# Patient Record
Sex: Female | Born: 1935 | Race: White | Hispanic: No | Marital: Married | Smoking: Never smoker
Health system: Southern US, Community
[De-identification: ages and names within clinical notes are randomized; demographics above are authoritative.]

## PROBLEM LIST (undated history)

## (undated) DIAGNOSIS — IMO0001 Reserved for inherently not codable concepts without codable children: Secondary | ICD-10-CM

## (undated) DIAGNOSIS — K219 Gastro-esophageal reflux disease without esophagitis: Secondary | ICD-10-CM

## (undated) DIAGNOSIS — E079 Disorder of thyroid, unspecified: Secondary | ICD-10-CM

## (undated) DIAGNOSIS — F329 Major depressive disorder, single episode, unspecified: Secondary | ICD-10-CM

## (undated) DIAGNOSIS — F32A Depression, unspecified: Secondary | ICD-10-CM

## (undated) DIAGNOSIS — I499 Cardiac arrhythmia, unspecified: Secondary | ICD-10-CM

## (undated) DIAGNOSIS — R0600 Dyspnea, unspecified: Secondary | ICD-10-CM

## (undated) DIAGNOSIS — I10 Essential (primary) hypertension: Secondary | ICD-10-CM

## (undated) DIAGNOSIS — C4490 Unspecified malignant neoplasm of skin, unspecified: Secondary | ICD-10-CM

## (undated) HISTORY — DX: Reserved for inherently not codable concepts without codable children: IMO0001

## (undated) HISTORY — DX: Unspecified malignant neoplasm of skin, unspecified: C44.90

## (undated) HISTORY — PX: NECK SURGERY: SHX720

## (undated) HISTORY — DX: Essential (primary) hypertension: I10

## (undated) HISTORY — DX: Gastro-esophageal reflux disease without esophagitis: K21.9

## (undated) HISTORY — PX: BREAST BIOPSY: SHX20

---
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
## 2005-01-14 ENCOUNTER — Ambulatory Visit: Payer: Self-pay | Admitting: Internal Medicine

## 2005-08-29 ENCOUNTER — Ambulatory Visit: Payer: Self-pay | Admitting: Internal Medicine

## 2005-09-03 ENCOUNTER — Ambulatory Visit: Payer: Self-pay | Admitting: Unknown Physician Specialty

## 2005-09-03 ENCOUNTER — Other Ambulatory Visit: Payer: Self-pay

## 2005-09-08 ENCOUNTER — Inpatient Hospital Stay: Payer: Self-pay | Admitting: Unknown Physician Specialty

## 2005-12-23 ENCOUNTER — Ambulatory Visit: Payer: Self-pay | Admitting: Nurse Practitioner

## 2006-02-03 ENCOUNTER — Ambulatory Visit: Payer: Self-pay | Admitting: Internal Medicine

## 2006-08-12 ENCOUNTER — Ambulatory Visit: Payer: Self-pay | Admitting: Unknown Physician Specialty

## 2007-03-16 ENCOUNTER — Ambulatory Visit: Payer: Self-pay | Admitting: Internal Medicine

## 2007-10-28 ENCOUNTER — Ambulatory Visit: Payer: Self-pay | Admitting: Unknown Physician Specialty

## 2008-03-16 ENCOUNTER — Ambulatory Visit: Payer: Self-pay | Admitting: Internal Medicine

## 2009-03-20 ENCOUNTER — Ambulatory Visit: Payer: Self-pay | Admitting: Ophthalmology

## 2009-03-21 ENCOUNTER — Ambulatory Visit: Payer: Self-pay | Admitting: Internal Medicine

## 2009-04-02 ENCOUNTER — Ambulatory Visit: Payer: Self-pay | Admitting: Ophthalmology

## 2009-06-26 ENCOUNTER — Ambulatory Visit: Payer: Self-pay | Admitting: Ophthalmology

## 2009-07-16 ENCOUNTER — Ambulatory Visit: Payer: Self-pay | Admitting: Ophthalmology

## 2010-03-27 ENCOUNTER — Ambulatory Visit: Payer: Self-pay | Admitting: Internal Medicine

## 2010-11-21 ENCOUNTER — Emergency Department: Payer: Self-pay | Admitting: Emergency Medicine

## 2011-04-14 ENCOUNTER — Ambulatory Visit: Payer: Self-pay | Admitting: Family Medicine

## 2011-12-24 ENCOUNTER — Ambulatory Visit: Payer: Self-pay | Admitting: Internal Medicine

## 2012-04-14 ENCOUNTER — Ambulatory Visit: Payer: Self-pay | Admitting: Internal Medicine

## 2013-05-03 ENCOUNTER — Ambulatory Visit: Payer: Self-pay | Admitting: Family Medicine

## 2013-06-07 ENCOUNTER — Encounter: Payer: Self-pay | Admitting: Podiatry

## 2013-06-08 ENCOUNTER — Encounter: Payer: Self-pay | Admitting: Podiatry

## 2013-06-08 ENCOUNTER — Ambulatory Visit (INDEPENDENT_AMBULATORY_CARE_PROVIDER_SITE_OTHER): Payer: Medicare Other | Admitting: Podiatry

## 2013-06-08 ENCOUNTER — Ambulatory Visit (INDEPENDENT_AMBULATORY_CARE_PROVIDER_SITE_OTHER): Payer: Medicare Other

## 2013-06-08 VITALS — BP 154/80 | HR 61 | Resp 16 | Ht 67.0 in | Wt 175.0 lb

## 2013-06-08 DIAGNOSIS — M19079 Primary osteoarthritis, unspecified ankle and foot: Secondary | ICD-10-CM

## 2013-06-08 DIAGNOSIS — M79671 Pain in right foot: Secondary | ICD-10-CM

## 2013-06-08 DIAGNOSIS — M79609 Pain in unspecified limb: Secondary | ICD-10-CM

## 2013-06-08 NOTE — Progress Notes (Signed)
N PAIN L B/L FOOT - RIGHT -LATERAL, LEFT-? D 85M? O SLOWLY C WORSE A WALKING T OTC INSERTS

## 2013-06-08 NOTE — Progress Notes (Signed)
[place] presents today as an 79 year old white female healthy and active with a chief complaint of pain and generalized uncomfortable sensation to the foot bilaterally she has been to see a chiropractor who told her that her back pain is associated with her high arches. She denies any trauma to the feet.  Objective: Vital signs are stable she is alert and oriented x3 I have reviewed her past medical history medications and allergies Vascular evaluation demonstrates +2/4 DPPT and capillary fill time to digits one through 5 is immediate. Deep tendon reflexes are brisk and equal bilateral. Muscle strength + over 5 dorsiflexors plantar flexors inverters and evertors. All and transit musculature is intact orthopedic evaluation demonstrates mild cavus deformities bilateral with mild flexible hammertoe deformities noted bilateral as well she has some tenderness on palpation of the sinus tarsi of the right foot but minimally reactive to the pain. Radiographic evaluation does demonstrates early osteoarthritic changes to the midfoot bilateral. She has no pain on palpation of the medial calcaneal tubercle or the plantar fascia.  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  Plan: I offered her injections were she declined I offered her information regarding shoe gear and suggested that she should followup with the shoe market.

## 2013-10-26 DIAGNOSIS — K219 Gastro-esophageal reflux disease without esophagitis: Secondary | ICD-10-CM | POA: Insufficient documentation

## 2013-10-26 DIAGNOSIS — E785 Hyperlipidemia, unspecified: Secondary | ICD-10-CM | POA: Insufficient documentation

## 2013-10-26 DIAGNOSIS — I679 Cerebrovascular disease, unspecified: Secondary | ICD-10-CM | POA: Insufficient documentation

## 2013-10-26 DIAGNOSIS — K5732 Diverticulitis of large intestine without perforation or abscess without bleeding: Secondary | ICD-10-CM | POA: Insufficient documentation

## 2013-11-22 DIAGNOSIS — M549 Dorsalgia, unspecified: Secondary | ICD-10-CM | POA: Insufficient documentation

## 2014-05-05 ENCOUNTER — Ambulatory Visit: Payer: Self-pay | Admitting: Family Medicine

## 2014-05-19 ENCOUNTER — Ambulatory Visit: Payer: Self-pay | Admitting: Family Medicine

## 2014-09-21 ENCOUNTER — Ambulatory Visit (INDEPENDENT_AMBULATORY_CARE_PROVIDER_SITE_OTHER): Payer: Medicare Other | Admitting: Podiatry

## 2014-09-21 ENCOUNTER — Encounter: Payer: Self-pay | Admitting: Podiatry

## 2014-09-21 ENCOUNTER — Ambulatory Visit (INDEPENDENT_AMBULATORY_CARE_PROVIDER_SITE_OTHER): Payer: Medicare Other

## 2014-09-21 VITALS — BP 166/91 | HR 70 | Resp 16 | Ht 67.0 in | Wt 175.0 lb

## 2014-09-21 DIAGNOSIS — M779 Enthesopathy, unspecified: Secondary | ICD-10-CM

## 2014-09-21 DIAGNOSIS — M79671 Pain in right foot: Secondary | ICD-10-CM

## 2014-09-22 NOTE — Progress Notes (Signed)
Patient ID: Jenny [name], female   DOB: 06/02/1928, 81 y.o.   MRN: [id number]  Subjective: Jenny [name] presents the office today with complaints of pain on the outside aspect of her right foot which has been ongoing for "several months". She states that she has painted the outside part of her foot particularly with walking and standing. She does not have pain at rest. She denies any recent injury or trauma to the area. She denies any change increase activity the time of onset of symptoms. She denies any swelling or redness overlying the area. Since her last appointment she has gone to the shoe market and purchase new shoes and inserts given her high arch foot type. No other complaints at this time.  Objective: AAO 3, NAD DP/PT pulses palpable, CRT less than 3 seconds Protective sensation intact with Simms Weinstein monofilament, vibratory sensation intact, Achilles tendon reflex intact. She has a mildly high increase in medial arch height bilaterally. Upon weightbearing she does tend to put more pressure on the outside aspect of her foot. There is tenderness overlying the fifth metatarsal along the central aspect. There is no specific pinpoint bony tenderness and there is no pain with vibratory sensation. There is no pain along the course of the peroneal tendons. There is no other areas of tenderness to bilateral lower extremities. There is no overlying edema, erythema, increase in warmth bilaterally. MMT 5/5, ROM WNL No open lesions or pre-ulcer lesions identified bilaterally. No pain with calf compression, swelling, warmth, erythema.  Assessment: 81 year old female with slight cavus deformity with lateral foot pain  Plan: -X-rays were obtained and reviewed with the patient.  -Treatment options were discussed the patient including alternatives, risks, complications. -I modify the patient's orthotics to help alleviate some of the pressure off the lateral aspect of the foot. I also discussed with  her that if the over-the-counter orthotics are not helping she may benefit from custom orthotics in the future. -Follow-up symptoms and a result of the 4 weeks or sooner if any problems are to arise. In the meantime encouraged to call the office with any questions, concerns, change in symptoms.

## 2015-04-06 ENCOUNTER — Other Ambulatory Visit: Payer: Self-pay | Admitting: Family Medicine

## 2015-04-06 DIAGNOSIS — R1012 Left upper quadrant pain: Secondary | ICD-10-CM

## 2015-04-06 DIAGNOSIS — R109 Unspecified abdominal pain: Secondary | ICD-10-CM

## 2015-04-09 ENCOUNTER — Ambulatory Visit
Admission: RE | Admit: 2015-04-09 | Discharge: 2015-04-09 | Disposition: A | Discharge status: Home / Self Care | Payer: Medicare Other | Source: Ambulatory Visit | Attending: Family Medicine | Admitting: Family Medicine

## 2015-04-09 DIAGNOSIS — R109 Unspecified abdominal pain: Secondary | ICD-10-CM | POA: Diagnosis not present

## 2015-04-09 DIAGNOSIS — R1012 Left upper quadrant pain: Secondary | ICD-10-CM | POA: Diagnosis present

## 2015-04-09 DIAGNOSIS — K5790 Diverticulosis of intestine, part unspecified, without perforation or abscess without bleeding: Secondary | ICD-10-CM | POA: Diagnosis not present

## 2015-04-09 MED ORDER — IOHEXOL 300 MG/ML  SOLN
100.0000 mL | Freq: Once | INTRAMUSCULAR | Status: AC | PRN
  Administered 2015-04-09: 100 mL via INTRAVENOUS

## 2015-04-26 ENCOUNTER — Encounter: Payer: Self-pay | Admitting: *Deleted

## 2015-04-26 ENCOUNTER — Ambulatory Visit: Payer: Medicare Other | Admitting: Anesthesiology

## 2015-04-26 ENCOUNTER — Ambulatory Visit
Admission: RE | Admit: 2015-04-26 | Discharge: 2015-04-26 | Disposition: A | Discharge status: Home / Self Care | Payer: Medicare Other | Source: Ambulatory Visit | Attending: Unknown Physician Specialty | Admitting: Unknown Physician Specialty

## 2015-04-26 ENCOUNTER — Encounter
Admission: RE | Disposition: A | Discharge status: Home / Self Care | Payer: Self-pay | Source: Ambulatory Visit | Attending: Unknown Physician Specialty

## 2015-04-26 DIAGNOSIS — Z881 Allergy status to other antibiotic agents status: Secondary | ICD-10-CM | POA: Insufficient documentation

## 2015-04-26 DIAGNOSIS — K449 Diaphragmatic hernia without obstruction or gangrene: Secondary | ICD-10-CM | POA: Diagnosis not present

## 2015-04-26 DIAGNOSIS — Z79899 Other long term (current) drug therapy: Secondary | ICD-10-CM | POA: Diagnosis not present

## 2015-04-26 DIAGNOSIS — K219 Gastro-esophageal reflux disease without esophagitis: Secondary | ICD-10-CM | POA: Diagnosis not present

## 2015-04-26 DIAGNOSIS — I10 Essential (primary) hypertension: Secondary | ICD-10-CM | POA: Diagnosis not present

## 2015-04-26 DIAGNOSIS — Z85828 Personal history of other malignant neoplasm of skin: Secondary | ICD-10-CM | POA: Insufficient documentation

## 2015-04-26 DIAGNOSIS — R1012 Left upper quadrant pain: Secondary | ICD-10-CM | POA: Insufficient documentation

## 2015-04-26 DIAGNOSIS — K317 Polyp of stomach and duodenum: Secondary | ICD-10-CM | POA: Diagnosis not present

## 2015-04-26 DIAGNOSIS — Z7902 Long term (current) use of antithrombotics/antiplatelets: Secondary | ICD-10-CM | POA: Insufficient documentation

## 2015-04-26 DIAGNOSIS — R935 Abnormal findings on diagnostic imaging of other abdominal regions, including retroperitoneum: Secondary | ICD-10-CM | POA: Diagnosis present

## 2015-04-26 HISTORY — PX: ESOPHAGOGASTRODUODENOSCOPY (EGD) WITH PROPOFOL: SHX5813

## 2015-04-26 SURGERY — ESOPHAGOGASTRODUODENOSCOPY (EGD) WITH PROPOFOL
Anesthesia: General

## 2015-04-26 MED ORDER — PROPOFOL 10 MG/ML IV BOLUS
INTRAVENOUS | Status: DC | PRN
  Administered 2015-04-26: 50 mg via INTRAVENOUS

## 2015-04-26 MED ORDER — SODIUM CHLORIDE 0.9 % IV SOLN: INTRAVENOUS | Status: DC

## 2015-04-26 MED ORDER — FENTANYL CITRATE (PF) 100 MCG/2ML IJ SOLN
INTRAMUSCULAR | Status: DC | PRN
  Administered 2015-04-26: 50 ug via INTRAVENOUS

## 2015-04-26 MED ORDER — SODIUM CHLORIDE 0.9 % IV SOLN
INTRAVENOUS | Status: DC
  Administered 2015-04-26: 09:00:00 via INTRAVENOUS

## 2015-04-26 MED ORDER — LIDOCAINE HCL (PF) 2 % IJ SOLN
INTRAMUSCULAR | Status: DC | PRN
  Administered 2015-04-26: 50 mg

## 2015-04-26 MED ORDER — PROPOFOL 500 MG/50ML IV EMUL
INTRAVENOUS | Status: DC | PRN
  Administered 2015-04-26: 100 ug/kg/min via INTRAVENOUS

## 2015-04-26 NOTE — Anesthesia Postprocedure Evaluation (Signed)
  Anesthesia Post-op Note  Patient: Jenny [name]  Procedure(s) Performed: Procedure(s): ESOPHAGOGASTRODUODENOSCOPY (EGD) WITH PROPOFOL (N/A)  Anesthesia type:General  Patient location: PACU  Post pain: Pain level controlled  Post assessment: Post-op Vital signs reviewed, Patient's Cardiovascular Status Stable, Respiratory Function Stable, Patent Airway and No signs of Nausea or vomiting  Post vital signs: Reviewed and stable  Last Vitals:  Filed Vitals:   04/26/15 1020  BP: 160/117  Pulse: 55  Temp:   Resp: 16    Level of consciousness: awake, alert  and patient cooperative  Complications: No apparent anesthesia complications

## 2015-04-26 NOTE — H&P (Signed)
   Primary Care Physician:  Marcello Fennel, MD Primary Gastroenterologist:  Dr. Vira Agar  Pre-Procedure History & Physical: HPI:  Jenny [name] is a 81 y.o. female is here for an endoscopy.   Past Medical History  Diagnosis Date  . Reflux   . Skin cancer   . HBP (high blood pressure)     Past Surgical History  Procedure Laterality Date  . Neck surgery    . Breast biopsy Right     Prior to Admission medications   Medication Sig Start Date End Date Taking? Authorizing Provider  Cholecalciferol (VITAMIN D-3 PO) Take by mouth daily.   Yes Historical Provider, MD  ciprofloxacin (CIPRO) 250 MG tablet  04/28/13  Yes Historical Provider, MD  MELATONIN PO Take by mouth daily.   Yes Historical Provider, MD  Multiple Vitamins-Minerals (MULTIVITAMIN PO) Take by mouth daily.   Yes Historical Provider, MD  pantoprazole (PROTONIX) 20 MG tablet  05/07/13  Yes Historical Provider, MD  simvastatin (ZOCOR) 20 MG tablet  05/07/13  Yes Historical Provider, MD  clopidogrel (PLAVIX) 75 MG tablet  05/07/13   Historical Provider, MD    Allergies as of 04/17/2015 - Review Complete 04/09/2015  Allergen Reaction Noted  . Flagyl [metronidazole] Nausea And Vomiting 06/08/2013    History reviewed. No pertinent family history.  Social History   Social History  . Marital Status: Married    Spouse Name: N/A  . Number of Children: N/A  . Years of Education: N/A   Occupational History  . Not on file.   Social History Main Topics  . Smoking status: Never Smoker   . Smokeless tobacco: Never Used  . Alcohol Use: No  . Drug Use: No  . Sexual Activity: Not on file   Other Topics Concern  . Not on file   Social History Narrative    Review of Systems: See HPI, otherwise negative ROS  Physical Exam: BP 173/75 mmHg  Pulse 69  Temp(Src) 98.6 F (37 C)  Resp 14  Ht 5\' 7"  (1.702 m)  Wt 79.379 kg (175 lb)  BMI 27.40 kg/m2  SpO2 96% General:   Alert,  pleasant and cooperative in NAD Head:   Normocephalic and atraumatic. Neck:  Supple; no masses or thyromegaly. Lungs:  Clear throughout to auscultation.    Heart:  Regular rate and rhythm. Abdomen:  Soft, nontender and nondistended. Normal bowel sounds, without guarding, and without rebound.   Neurologic:  Alert and  oriented x4;  grossly normal neurologically.  Impression/Plan: Jenny [name] is here for an endoscopy to be performed for abnormal CT scan of stomach and Left upper abd pain.  Risks, benefits, limitations, and alternatives regarding  endoscopy have been reviewed with the patient.  Questions have been answered.  All parties agreeable.   Gaylyn Cheers, MD  04/26/2015, 9:22 AM

## 2015-04-26 NOTE — Anesthesia Preprocedure Evaluation (Signed)
Anesthesia Evaluation  Patient identified by MRN, date of birth, ID band Patient awake    Reviewed: Allergy & Precautions, H&P , NPO status , Patient's Chart, lab work & pertinent test results  History of Anesthesia Complications Negative for: history of anesthetic complications  Airway Mallampati: II  TM Distance: >3 FB Neck ROM: limited    Dental  (+) Poor Dentition, Chipped   Pulmonary neg pulmonary ROS, neg shortness of breath,    Pulmonary exam normal breath sounds clear to auscultation       Cardiovascular Exercise Tolerance: Good hypertension, (-) angina(-) DOE Normal cardiovascular exam Rhythm:regular Rate:Normal     Neuro/Psych negative neurological ROS  negative psych ROS   GI/Hepatic Neg liver ROS, GERD  Controlled,  Endo/Other  negative endocrine ROS  Renal/GU negative Renal ROS  negative genitourinary   Musculoskeletal   Abdominal   Peds  Hematology negative hematology ROS (+)   Anesthesia Other Findings Past Medical History:   Reflux                                                       Skin cancer                                                  HBP (high blood pressure)                                   Past Surgical History:   NECK SURGERY                                                  BREAST BIOPSY                                   Right             BMI    Body Mass Index   27.40 kg/m 2      Reproductive/Obstetrics negative OB ROS                             Anesthesia Physical Anesthesia Plan  ASA: III  Anesthesia Plan: General   Post-op Pain Management:    Induction:   Airway Management Planned:   Additional Equipment:   Intra-op Plan:   Post-operative Plan:   Informed Consent: I have reviewed the patients History and Physical, chart, labs and discussed the procedure including the risks, benefits and alternatives for the proposed anesthesia  with the patient or authorized representative who has indicated his/her understanding and acceptance.   Dental Advisory Given  Plan Discussed with: Anesthesiologist, CRNA and Surgeon  Anesthesia Plan Comments:         Anesthesia Quick Evaluation

## 2015-04-26 NOTE — Transfer of Care (Signed)
Immediate Anesthesia Transfer of Care Note  Patient: Jenny [name]  Procedure(s) Performed: Procedure(s): ESOPHAGOGASTRODUODENOSCOPY (EGD) WITH PROPOFOL (N/A)  Patient Location: PACU  Anesthesia Type:General  Level of Consciousness: sedated  Airway & Oxygen Therapy: Patient Spontanous Breathing and Patient connected to nasal cannula oxygen  Post-op Assessment: Report given to RN and Post -op Vital signs reviewed and stable  Post vital signs: Reviewed and stable  Last Vitals:  Filed Vitals:   04/26/15 0849  BP: 173/75  Pulse: 69  Temp: 37 C  Resp: 14    Complications: No apparent anesthesia complications

## 2015-04-26 NOTE — Op Note (Signed)
[facility] Patient Name: Jenny [name] Procedure Date: 04/26/2015 9:23 AM MRN: [id number] Account #: [id number] Date of Birth: Feb 12, 1928 Admit Type: Outpatient Age: 81 Room: [facility] ENDO ROOM 1 Gender: Female Note Status: Finalized Procedure:         Upper GI endoscopy Indications:       Abdominal pain in the left upper quadrant, Abnormal CT of                     the GI tract Providers:         Manya Silvas, MD Referring MD:      Caprice Renshaw (Referring MD) Medicines:         Propofol per Anesthesia Complications:     No immediate complications. Procedure:         Pre-Anesthesia Assessment:                    - After reviewing the risks and benefits, the patient was                     deemed in satisfactory condition to undergo the procedure.                    After obtaining informed consent, the endoscope was passed                     under direct vision. Throughout the procedure, the                     patient's blood pressure, pulse, and oxygen saturations                     were monitored continuously. The Olympus GIF-160 endoscope                     (S#. [id number]) was introduced through the mouth, and                     advanced to the second part of duodenum. The upper GI                     endoscopy was accomplished without difficulty. The patient                     tolerated the procedure well. Findings:      The examined esophagus was normal. GEJ 39cm.      A small hiatus hernia was present.      Multiple small sessile polyps with no bleeding and no stigmata of recent       bleeding were found in the gastric body. Biopsies were taken with a cold       forceps for histology.      Normal mucosa was found in the entire examined stomach. Biopsies were       taken with a cold forceps for histology. Biopsies were taken with a cold       forceps for Helicobacter pylori testing.      The examined duodenum was  normal. Impression:        - Normal esophagus.                    - Small hiatus hernia.                    -  Multiple gastric polyps. Biopsied.                    - Normal mucosa was found in the entire stomach. Biopsied.                    - Normal examined duodenum. Recommendation:    - Await pathology results. Manya Silvas, MD 04/26/2015 9:38:01 AM This report has been signed electronically. Number of Addenda: 0 Note Initiated On: 04/26/2015 9:23 AM      [facility]

## 2015-04-27 ENCOUNTER — Encounter: Payer: Self-pay | Admitting: Unknown Physician Specialty

## 2015-04-27 LAB — SURGICAL PATHOLOGY

## 2015-06-07 DIAGNOSIS — E559 Vitamin D deficiency, unspecified: Secondary | ICD-10-CM | POA: Insufficient documentation

## 2015-07-31 ENCOUNTER — Other Ambulatory Visit: Payer: Self-pay | Admitting: Unknown Physician Specialty

## 2015-07-31 ENCOUNTER — Ambulatory Visit
Admission: RE | Admit: 2015-07-31 | Discharge: 2015-07-31 | Disposition: A | Discharge status: Home / Self Care | Payer: Medicare Other | Source: Ambulatory Visit | Attending: Unknown Physician Specialty | Admitting: Unknown Physician Specialty

## 2015-07-31 DIAGNOSIS — R05 Cough: Secondary | ICD-10-CM | POA: Diagnosis present

## 2015-07-31 DIAGNOSIS — R059 Cough, unspecified: Secondary | ICD-10-CM

## 2015-10-03 ENCOUNTER — Ambulatory Visit (INDEPENDENT_AMBULATORY_CARE_PROVIDER_SITE_OTHER): Payer: Medicare Other | Admitting: Podiatry

## 2015-10-03 ENCOUNTER — Encounter: Payer: Self-pay | Admitting: Podiatry

## 2015-10-03 ENCOUNTER — Ambulatory Visit (INDEPENDENT_AMBULATORY_CARE_PROVIDER_SITE_OTHER): Payer: Medicare Other

## 2015-10-03 VITALS — BP 159/82 | HR 61 | Resp 18

## 2015-10-03 DIAGNOSIS — R52 Pain, unspecified: Secondary | ICD-10-CM

## 2015-10-03 MED ORDER — MELOXICAM 15 MG PO TABS: 15.0000 mg | ORAL_TABLET | Freq: Every day | ORAL | Status: DC

## 2015-10-03 NOTE — Progress Notes (Signed)
She presents today with chief complaint of bilateral heel pain for the past several weeks. She states it is starting to make the top of her feet hurt.  Objective: Vital signs are stable she is alert and oriented 3. I have reviewed her past medical history medications allergies surgeries and social history. Pulses are strongly palpable bilateral. She has pain on palpation medial continued tubercles bilateral. Radiographs confirm small plantar distally oriented calcaneal heel spur with a soft tissue increase in density at the plantar fascial calcaneal insertion site and some calcification in the area.  Assessment: Proximal plantar fasciitis bilateral. Resulting in some lateral compensatory syndrome.  Plan: Offered her an injection today which she declined we did apply plantar fascial braces and started her on meloxicam. Follow-up with her in a couple of months.

## 2015-10-04 ENCOUNTER — Telehealth: Payer: Self-pay | Admitting: *Deleted

## 2015-10-04 NOTE — Telephone Encounter (Signed)
Pt states she has questions about her treatment yesterday.  I spoke to pt's husband and he said pt has gone to the [employer].  I told him to tell her I called.

## 2015-10-04 NOTE — Telephone Encounter (Signed)
Pt called states the foot bracelets hurt her feet and she went to [employer] in [place] and they fitted her for shoes and inserts and her feet feel much better.  Pt states she would like to return the straps given yesterday, they make her feet hurt worse and made a red mark on the top of her foot, and she wants to just use the medication prescribed and the shoes.  I told pt that we could not take the straps back, but I could get someone to show her how to put them on comfortably, pt refused.

## 2015-10-10 ENCOUNTER — Ambulatory Visit (INDEPENDENT_AMBULATORY_CARE_PROVIDER_SITE_OTHER): Payer: Medicare Other | Admitting: Podiatry

## 2015-10-10 DIAGNOSIS — M79671 Pain in right foot: Secondary | ICD-10-CM

## 2015-10-10 NOTE — Progress Notes (Signed)
Jenny [name] evaluated patients shoe gear and insoles and saw that she someone had built up her orthotic laterally to accommodate her foot from supinating so much. Jenny [name] advised the patient to wear the PF braces at night only and to keep the insoles in the the shoes during the day, that both were not necessary at the same time. Patient will try this and let us know if she continues to have trouble.

## 2015-11-07 ENCOUNTER — Encounter: Payer: Self-pay | Admitting: Podiatry

## 2015-11-07 ENCOUNTER — Ambulatory Visit (INDEPENDENT_AMBULATORY_CARE_PROVIDER_SITE_OTHER): Payer: Medicare Other | Admitting: Podiatry

## 2015-11-07 DIAGNOSIS — M722 Plantar fascial fibromatosis: Secondary | ICD-10-CM | POA: Diagnosis not present

## 2015-11-07 NOTE — Progress Notes (Signed)
She presents today for follow-up of plantar fasciitis wearing her plantar fascia braces and utilizing her meloxicam. She states that she is not doing any better at this point.  Objective: Vital signs are stable she is alert and oriented 3. Pulses are palpable. She has no pain on palpation medial calcaneal tuber was bilateral which is a 100% improvement from previous evaluation. Patient states that she didn't realize they were better. She states that it must be braces and the medicine.  Assessment: Well-healing plantar fasciitis bilateral.  Plan: Discussed etiology pathology conservative versus surgical therapies. We discussed the need for shoes and we evaluated the shoes that she brought with her in a bag today. I recommended new balance tennis shoes. I recommended that she continue to utilize the brace and the meloxicam for another month. Follow up with [place] as needed.

## 2015-12-12 ENCOUNTER — Ambulatory Visit: Payer: Medicare Other | Admitting: Podiatry

## 2015-12-19 ENCOUNTER — Other Ambulatory Visit: Payer: Self-pay | Admitting: Family Medicine

## 2015-12-19 DIAGNOSIS — R911 Solitary pulmonary nodule: Secondary | ICD-10-CM

## 2015-12-26 ENCOUNTER — Ambulatory Visit: Payer: Medicare Other | Admitting: Podiatry

## 2015-12-27 ENCOUNTER — Ambulatory Visit
Admission: RE | Admit: 2015-12-27 | Discharge: 2015-12-27 | Disposition: A | Discharge status: Home / Self Care | Payer: Medicare Other | Source: Ambulatory Visit | Attending: Family Medicine | Admitting: Family Medicine

## 2015-12-27 DIAGNOSIS — R911 Solitary pulmonary nodule: Secondary | ICD-10-CM | POA: Insufficient documentation

## 2015-12-27 DIAGNOSIS — J8410 Pulmonary fibrosis, unspecified: Secondary | ICD-10-CM | POA: Diagnosis not present

## 2016-01-21 ENCOUNTER — Encounter: Payer: Self-pay | Admitting: Podiatry

## 2016-01-21 ENCOUNTER — Ambulatory Visit (INDEPENDENT_AMBULATORY_CARE_PROVIDER_SITE_OTHER): Payer: Medicare Other | Admitting: Podiatry

## 2016-01-21 DIAGNOSIS — M722 Plantar fascial fibromatosis: Secondary | ICD-10-CM

## 2016-01-21 NOTE — Progress Notes (Signed)
She presents today stating that her feet are doing much better however they're still painful particularly around the heel.  Objective: Vital signs are stable she is alert and oriented 3. Pulses are palpable. She has pain on palpation medial calcaneal tubercle bilaterally. Pulses are palpable pain.  Assessment: Pain in limb secondary to plantar fasciitis.  Plan: Discussed etiology pathology conservative versus surgical therapies injected the bilateral heels today with Kenalog and local anesthetic. Follow-up with her in 2 months if necessary.

## 2016-03-24 ENCOUNTER — Ambulatory Visit (INDEPENDENT_AMBULATORY_CARE_PROVIDER_SITE_OTHER): Payer: Medicare Other | Admitting: Podiatry

## 2016-03-24 ENCOUNTER — Encounter: Payer: Self-pay | Admitting: Podiatry

## 2016-03-24 DIAGNOSIS — M722 Plantar fascial fibromatosis: Secondary | ICD-10-CM | POA: Diagnosis not present

## 2016-03-24 MED ORDER — DICLOFENAC SODIUM 1 % TD GEL: 4.0000 g | Freq: Four times a day (QID) | TRANSDERMAL | 3 refills | Status: DC

## 2016-03-24 NOTE — Progress Notes (Signed)
She presents today for follow-up of tenderness to her forefoot. She states that the plantar fasciitis has resolved now left with pain in the forefoot area bilaterally. She states there is minimal discomfort but is just when she walks.  Objective: Vital signs are stable alert and oriented 3. Pulses are palpable. Neurologic sensorium is intact. Deep tendon reflexes are intact. She has no pain on palpation medially continued tubercles no reproductive pain on palpation and range of motion of the forefoot bilaterally.  Assessment: Forefoot metatarsalgia capsulitis secondary to resolving plantar fasciitis bilateral.  Plan: Started her on diclofenac gel will follow up with her as needed.

## 2016-06-10 ENCOUNTER — Ambulatory Visit (INDEPENDENT_AMBULATORY_CARE_PROVIDER_SITE_OTHER): Payer: Medicare Other | Admitting: Podiatry

## 2016-06-10 DIAGNOSIS — M25571 Pain in right ankle and joints of right foot: Secondary | ICD-10-CM

## 2016-06-10 DIAGNOSIS — M79671 Pain in right foot: Secondary | ICD-10-CM | POA: Diagnosis not present

## 2016-06-10 DIAGNOSIS — M79672 Pain in left foot: Secondary | ICD-10-CM | POA: Diagnosis not present

## 2016-06-10 DIAGNOSIS — M19079 Primary osteoarthritis, unspecified ankle and foot: Secondary | ICD-10-CM | POA: Diagnosis not present

## 2016-06-10 DIAGNOSIS — M25572 Pain in left ankle and joints of left foot: Secondary | ICD-10-CM

## 2016-06-10 MED ORDER — NONFORMULARY OR COMPOUNDED ITEM: 1.0000 g | Freq: Four times a day (QID) | 2 refills | Status: DC

## 2016-06-10 NOTE — Progress Notes (Signed)
Subjective:  Patient presents today for evaluation of generalized foot pain to the bilateral lower extremities. Patient complains that she experiences just generalized hurting to the bilateral lower extremities when she walks. Patient states that her plantar fascitis which was treated by Dr. Milinda Pointer has resolved. This is a different type of pain. Patient presents today for further treatment and evaluation    Objective/Physical Exam General: The patient is alert and oriented x3 in no acute distress.  Dermatology: Skin is warm, dry and supple bilateral lower extremities. Negative for open lesions or macerations.  Vascular: Palpable pedal pulses bilaterally. No edema or erythema noted. Capillary refill within normal limits.  Neurological: Epicritic and protective threshold grossly intact bilaterally.   Musculoskeletal Exam: Range of motion within normal limits to all pedal and ankle joints bilateral. Muscle strength 5/5 in all groups bilateral.  Negative for pain on palpation to any specific anatomical location bilateral lower extremities.  Assessment: #1 generalized foot pain bilateral lower extremities #2 arthritis with bone spur formation bilateral midfoot  Plan of Care:  #1 Patient was evaluated. #2 today we discussed conservative modalities including supportive shoe gear. #3 prescription for anti-inflammatory pain cream dispensed through [facility] #4 return to clinic when necessary   Dr. Edrick Kins, [place]

## 2016-10-27 ENCOUNTER — Other Ambulatory Visit: Payer: Self-pay | Admitting: Physical Medicine and Rehabilitation

## 2016-10-27 DIAGNOSIS — M5416 Radiculopathy, lumbar region: Secondary | ICD-10-CM

## 2016-11-07 ENCOUNTER — Ambulatory Visit: Payer: Medicare Other

## 2016-11-07 ENCOUNTER — Ambulatory Visit
Admission: RE | Admit: 2016-11-07 | Discharge: 2016-11-07 | Disposition: A | Discharge status: Home / Self Care | Payer: Medicare Other | Source: Ambulatory Visit | Attending: Physical Medicine and Rehabilitation | Admitting: Physical Medicine and Rehabilitation

## 2016-11-07 DIAGNOSIS — M48061 Spinal stenosis, lumbar region without neurogenic claudication: Secondary | ICD-10-CM | POA: Diagnosis not present

## 2016-11-07 DIAGNOSIS — M5116 Intervertebral disc disorders with radiculopathy, lumbar region: Secondary | ICD-10-CM | POA: Diagnosis not present

## 2016-11-07 DIAGNOSIS — M5416 Radiculopathy, lumbar region: Secondary | ICD-10-CM | POA: Diagnosis present

## 2016-11-07 DIAGNOSIS — M5136 Other intervertebral disc degeneration, lumbar region: Secondary | ICD-10-CM | POA: Insufficient documentation

## 2016-11-07 DIAGNOSIS — D1809 Hemangioma of other sites: Secondary | ICD-10-CM | POA: Diagnosis not present

## 2016-12-17 ENCOUNTER — Ambulatory Visit: Payer: Medicare Other | Attending: Physical Medicine and Rehabilitation

## 2016-12-17 DIAGNOSIS — M79672 Pain in left foot: Secondary | ICD-10-CM | POA: Diagnosis not present

## 2016-12-17 DIAGNOSIS — M79671 Pain in right foot: Secondary | ICD-10-CM | POA: Insufficient documentation

## 2016-12-17 DIAGNOSIS — G8929 Other chronic pain: Secondary | ICD-10-CM | POA: Diagnosis present

## 2016-12-17 DIAGNOSIS — M545 Low back pain: Secondary | ICD-10-CM | POA: Diagnosis present

## 2016-12-17 DIAGNOSIS — M25675 Stiffness of left foot, not elsewhere classified: Secondary | ICD-10-CM | POA: Insufficient documentation

## 2016-12-17 NOTE — Therapy (Signed)
[facility] [address] [place], [address] Phone: [phone]   Fax:  [phone]  Physical Therapy Evaluation  Patient Details  Name: Jenny [name] MRN: [id number] Date of Birth: [birth year] Referring Provider: Sharlet Salina  Encounter Date: 12/17/2016      PT End of Session - 12/17/16 1454    Visit Number 1   Number of Visits 12   Date for PT Re-Evaluation 01/28/17   Authorization Type 1/ 10 G Code   PT Start Time 1300   PT Stop Time 1400   PT Time Calculation (min) 60 min   Activity Tolerance Patient tolerated treatment well   Behavior During Therapy [facility] for tasks assessed/performed      Past Medical History:  Diagnosis Date  . HBP (high blood pressure)   . Reflux   . Skin cancer     Past Surgical History:  Procedure Laterality Date  . BREAST BIOPSY Right   . ESOPHAGOGASTRODUODENOSCOPY (EGD) WITH PROPOFOL N/A 04/26/2015   Procedure: ESOPHAGOGASTRODUODENOSCOPY (EGD) WITH PROPOFOL;  Surgeon: Manya Silvas, MD;  Location: [facility] ENDOSCOPY;  Service: Endoscopy;  Laterality: N/A;  . NECK SURGERY      There were no vitals filed for this visit.       Subjective Assessment - 12/17/16 1316    Subjective Patient reports increased difficulty with transfering out of a chair, bending, standing, walking (increased pain with performing), donning/doffing, and lifting. Patient reports increased foot pain starting in Feb 2018 along B LE (diagnosised with plantar fasciaitis). Patient reports increased foot pain with walking (less pain in the back). Patient reports increased pain in the am along the back and B feet. Patient reports pain is mostly on the L side and denies radiating symptoms down the L LE. Patient reports no pain at rest.     Pertinent History Patient denies PMH; Reports pain in both feet and the back have worsened since the beginning of this year.    Limitations Lifting;Walking;Standing    How long can you stand comfortably? 48min   Diagnostic tests X -Ray   Currently in Pain? Yes   Pain Score 0-No pain  Worst: 5/10 for back; Worst 6/10 for feet   Pain Location Back   Pain Orientation Lower   Pain Descriptors / Indicators Aching;Burning   Pain Type Chronic pain   Pain Onset More than a month ago   Pain Frequency Intermittent            [facility] PT Assessment - 12/17/16 1311      Assessment   Medical Diagnosis LBP   Referring Provider Sharlet Salina   Onset Date/Surgical Date 07/07/16   Hand Dominance Right   Next MD Visit 01/09/17   Prior Therapy yes     Balance Screen   Has the patient fallen in the past 6 months No   Has the patient had a decrease in activity level because of a fear of falling?  Yes   Is the patient reluctant to leave their home because of a fear of falling?  No     Home Social worker Private residence   Living Arrangements Spouse/significant other   Available Help at Discharge Family   Type of [place] to enter   Entrance Stairs-Number of Steps 4   Entrance Stairs-Rails Can reach both   [place] One level   [place] None  Prior Function   Level of Independence Independent   Vocation Retired   Biomedical scientist  N/A   Leisure go out to lunch, bridge, walking     Cognition   Overall Cognitive Status Within Functional Limits for tasks assessed     Observation/Other Assessments   Other Surveys  Other Surveys   Modified [place] 46/80     Sensation   Light Touch Appears Intact     Posture/Postural Control   Posture Comments Increased lumbar flexion in standing     ROM / Strength   AROM / PROM / Strength AROM;Strength     AROM   AROM Assessment Site Hip;Knee;Ankle;Lumbar   Right/Left Hip Right;Left   Right Hip Extension 5   Right Hip Flexion 110   Right Hip ABduction 40   Right Hip ADduction 20   Left Hip Extension 5   Left Hip Flexion 110   Left Hip  ABduction 40   Left Hip ADduction 20   Right/Left Knee Right;Left   Right Knee Extension 0   Right Knee Flexion 90   Left Knee Extension 0   Left Knee Flexion 90   Right/Left Ankle Right;Left   Right Ankle Dorsiflexion -15   Right Ankle Plantar Flexion 60   Right Ankle Inversion 30   Right Ankle Eversion 5   Left Ankle Dorsiflexion -15   Left Ankle Plantar Flexion 60   Left Ankle Inversion 30   Left Ankle Eversion 5   Lumbar Flexion WNL   Lumbar Extension 75% from NL - pain   Lumbar - Right Side Bend WNL   Lumbar - Left Side Bend 50% - pain   Lumbar - Right Rotation WNL   Lumbar - Left Rotation 25% - pain     Strength   Strength Assessment Site Knee;Hip;Ankle;Lumbar   Right/Left Hip Right;Left   Right Hip Flexion 4-/5   Right Hip Extension 3+/5   Right Hip ABduction 4/5   Right Hip ADduction 4/5   Left Hip Flexion 4-/5   Left Hip Extension 3+/5   Left Hip ABduction 4-/5   Left Hip ADduction 4-/5   Right/Left Knee Right;Left   Right Knee Flexion 4+/5   Right Knee Extension 5/5   Left Knee Flexion 4-/5   Left Knee Extension 4/5   Right/Left Ankle Right;Left   Right Ankle Dorsiflexion 4+/5   Right Ankle Plantar Flexion --  unable to test   Right Ankle Inversion 4-/5   Right Ankle Eversion 4-/5   Left Ankle Plantar Flexion --  unable to perform   Left Ankle Inversion 4-/5   Left Ankle Eversion 4-/5   Lumbar Flexion 4/5   Lumbar Extension 4/5     Palpation   Spinal mobility Hypomobile - L1-S1   SI assessment  WNL   Palpation comment Increased TTP along gluteal musculature.        Special Tests    Special Tests Ankle/Foot Special Tests   Ankle/Foot Special Tests  Great Toe Extension Test  TTP along plantarfascia B     Great Toe Extension Test    Comments Negativ B     Transfers   Transfers Sit to Stand   Sit to Stand 6: Modified independent (Device/Increase time)  Requires use of UEs to perform     Ambulation/Gait   Gait Comments Increase lumbar  flexion througout movement       Objective measurements completed on examination: See above findings.    TREATMENT: Resisted Plantarflexion with GTB -- x 20  B Towel Scrunches in sitting -- x 20 B  Patient demonstrates decreased decreased pain along the arches of her feet after therapy session            PT Education - 12/17/16 1454    Education provided Yes   Education Details HEP: Towel scrunches, Plantarflexoin against GTB   Person(s) Educated Patient   Methods Explanation;Demonstration   Comprehension Verbalized understanding;Returned demonstration             PT Long Term Goals - 12/17/16 1502      PT LONG TERM GOAL #1   Title Patient will be independent with HEP to continue benefits of therapy after discharge.   Baseline Depenedent with exercise technique and progression   Time 6   Period Weeks   Status New     PT LONG TERM GOAL #2   Title Patient will improve MODI to under 10% to demonstrate improved ability to stand without pain.   Baseline MODI: 46%   Time 6   Period Weeks   Status New     PT LONG TERM GOAL #3   Title Patient will be able to walk without onset of pain to allow for patient to perform ADLs with greater comfort.    Baseline onset of pain as soon as she stands    Time 6   Period Weeks   Status New                Plan - 12/17/16 1455    Clinical Impression Statement Patient is an 83 yo right hand dominant female presenting with increased LBP and pain along B platar aspects of the foot. Patient demonstrates increased lumbar dysfunction with signs of hypomobility of the lumbar spine and gluteal dysfunction. Patient's sings and symptoms align with plantarfascitis along the plantar aspect of her feet bilaterally. Patient also demonstrates decreased lumbar/ hip/ and LE strength and will benefit from further skilled therapy focused on improving limitations to return to prior level of function.    History and Personal Factors  relevant to plan of care: Chronic LBP, pain along B plantarfascia   Clinical Presentation Stable   Clinical Presentation due to: improving of lumbar symptoms over the past week   Clinical Decision Making Moderate   Rehab Potential Fair   Clinical Impairments Affecting Rehab Potential (+) highly motivated (-) age and bilateral pain along plantar aspect of the foot   PT Frequency 2x / week   PT Duration 6 weeks   PT Treatment/Interventions Aquatic Therapy;ADLs/Self Care Home Management;Cryotherapy;Electrical Stimulation;Iontophoresis 4mg /ml Dexamethasone;Moist Heat;Traction;Balance training;Therapeutic exercise;Therapeutic activities;Gait training;Stair training;Functional mobility training;Neuromuscular re-education;Patient/family education;Passive range of motion;Manual techniques;Dry needling   PT Next Visit Plan Progress strengthening, improving ankle coordination   PT Home Exercise Plan Towel scrunches, resisted plantarflexion with GTB   Consulted and Agree with Plan of Care Patient      Patient will benefit from skilled therapeutic intervention in order to improve the following deficits and impairments:  Pain, Impaired sensation, Increased fascial restricitons, Decreased coordination, Decreased mobility, Hypermobility, Increased muscle spasms, Postural dysfunction, Decreased range of motion, Decreased endurance, Decreased activity tolerance, Decreased strength, Decreased balance, Difficulty walking  Visit Diagnosis: Pain in left foot - Plan: PT plan of care cert/re-cert  Pain in right foot - Plan: PT plan of care cert/re-cert  Chronic left-sided low back pain, with sciatica presence unspecified - Plan: PT plan of care cert/re-cert      G-Codes - 62/56/38 1542    Functional Assessment Tool Used (Outpatient Only)  clinical judgement, MODI, MMT, AROM   Functional Limitation Changing and maintaining body position   Changing and Maintaining Body Position Current Status [phone]) At least 40  percent but less than 60 percent impaired, limited or restricted   Changing and Maintaining Body Position Goal Status (H7290) At least 1 percent but less than 20 percent impaired, limited or restricted       Problem List Patient Active Problem List   Diagnosis Date Noted  . Avitaminosis D 06/07/2015  . Back ache 11/22/2013  . Artery disease, cerebral 10/26/2013  . Diverticulitis of colon 10/26/2013  . Acid reflux 10/26/2013  . Calcium blood increased 10/26/2013  . HLD (hyperlipidemia) 10/26/2013    Blythe Stanford, PT DPT 12/17/2016, 4:15 PM  [facility] [address] [place], [address] Phone: [phone]   Fax:  [phone]  Name: Jenny [name] MRN: [id number] Date of Birth: August 21, 1927

## 2016-12-18 ENCOUNTER — Ambulatory Visit: Payer: Medicare Other | Admitting: Physical Therapy

## 2016-12-18 DIAGNOSIS — M545 Low back pain: Secondary | ICD-10-CM

## 2016-12-18 DIAGNOSIS — M79671 Pain in right foot: Secondary | ICD-10-CM

## 2016-12-18 DIAGNOSIS — M79672 Pain in left foot: Secondary | ICD-10-CM | POA: Diagnosis not present

## 2016-12-18 DIAGNOSIS — G8929 Other chronic pain: Secondary | ICD-10-CM

## 2016-12-18 NOTE — Therapy (Signed)
[place] MAIN [facility] SERVICES [address] [place], [place], [address] Phone: [phone]   Fax:  [phone]  Physical Therapy Treatment  Patient Details  Name: Jenny [name] MRN: [id number] Date of Birth: 1927/12/14 Referring Provider: Sharlet Salina  Encounter Date: 12/18/2016      PT End of Session - 12/18/16 0847    Visit Number 2   Number of Visits 12   Date for PT Re-Evaluation 01/28/17   Authorization Type 2/ 10 G Code   PT Start Time 0820   PT Stop Time 0900   PT Time Calculation (min) 40 min   Activity Tolerance Patient tolerated treatment well   Behavior During Therapy [facility] for tasks assessed/performed      Past Medical History:  Diagnosis Date  . HBP (high blood pressure)   . Reflux   . Skin cancer     Past Surgical History:  Procedure Laterality Date  . BREAST BIOPSY Right   . ESOPHAGOGASTRODUODENOSCOPY (EGD) WITH PROPOFOL N/A 04/26/2015   Procedure: ESOPHAGOGASTRODUODENOSCOPY (EGD) WITH PROPOFOL;  Surgeon: Manya Silvas, MD;  Location: [facility] ENDOSCOPY;  Service: Endoscopy;  Laterality: N/A;  . NECK SURGERY      There were no vitals filed for this visit.      Subjective Assessment - 12/18/16 0841    Subjective Pt reports she has difficulty walking, getting out of a chair. She used to wear high heels. She does not have water shoes.    Pertinent History Patient denies PMH; Reports pain in both feet and the back have worsened since the beginning of this year.    Limitations Lifting;Walking;Standing   How long can you stand comfortably? 64min   Diagnostic tests X -Ray   Pain Onset More than a month ago            [facility] PT Assessment - 12/17/16 1311      Assessment   Medical Diagnosis LBP   Referring Provider Sharlet Salina   Onset Date/Surgical Date 07/07/16   Hand Dominance Right   Next MD Visit 01/09/17   Prior Therapy yes     Balance Screen   Has the patient fallen in the past 6 months No    Has the patient had a decrease in activity level because of a fear of falling?  Yes   Is the patient reluctant to leave their home because of a fear of falling?  No     Home Ecologist residence   Living Arrangements Spouse/significant other   Available Help at Discharge Family   Type of [place] to enter   Entrance Stairs-Number of Steps 4   Entrance Stairs-Rails Can reach both   [place] One level   [place] None     Prior Function   Level of Independence Independent   Vocation Retired   Biomedical scientist  N/A   Leisure go out to lunch, bridge, walking     Cognition   Overall Cognitive Status Within Functional Limits for tasks assessed     Observation/Other Assessments   Other Surveys  Other Surveys   Modified [place] 46/80     Sensation   Light Touch Appears Intact     Posture/Postural Control   Posture Comments Increased lumbar flexion in standing     ROM / Strength   AROM / PROM / Strength AROM;Strength     AROM   AROM Assessment Site Hip;Knee;Ankle;Lumbar  Right/Left Hip Right;Left   Right Hip Extension 5   Right Hip Flexion 110   Right Hip ABduction 40   Right Hip ADduction 20   Left Hip Extension 5   Left Hip Flexion 110   Left Hip ABduction 40   Left Hip ADduction 20   Right/Left Knee Right;Left   Right Knee Extension 0   Right Knee Flexion 90   Left Knee Extension 0   Left Knee Flexion 90   Right/Left Ankle Right;Left   Right Ankle Dorsiflexion -15   Right Ankle Plantar Flexion 60   Right Ankle Inversion 30   Right Ankle Eversion 5   Left Ankle Dorsiflexion -15   Left Ankle Plantar Flexion 60   Left Ankle Inversion 30   Left Ankle Eversion 5   Lumbar Flexion WNL   Lumbar Extension 75% from NL - pain   Lumbar - Right Side Bend WNL   Lumbar - Left Side Bend 50% - pain   Lumbar - Right Rotation WNL   Lumbar - Left Rotation 25% - pain     Strength   Strength  Assessment Site Knee;Hip;Ankle;Lumbar   Right/Left Hip Right;Left   Right Hip Flexion 4-/5   Right Hip Extension 3+/5   Right Hip ABduction 4/5   Right Hip ADduction 4/5   Left Hip Flexion 4-/5   Left Hip Extension 3+/5   Left Hip ABduction 4-/5   Left Hip ADduction 4-/5   Right/Left Knee Right;Left   Right Knee Flexion 4+/5   Right Knee Extension 5/5   Left Knee Flexion 4-/5   Left Knee Extension 4/5   Right/Left Ankle Right;Left   Right Ankle Dorsiflexion 4+/5   Right Ankle Plantar Flexion --  unable to test   Right Ankle Inversion 4-/5   Right Ankle Eversion 4-/5   Left Ankle Plantar Flexion --  unable to perform   Left Ankle Inversion 4-/5   Left Ankle Eversion 4-/5   Lumbar Flexion 4/5   Lumbar Extension 4/5     Palpation   Spinal mobility Hypomobile - L1-S1   SI assessment  WNL   Palpation comment Increased TTP along gluteal musculature.        Special Tests    Special Tests Ankle/Foot Special Tests   Ankle/Foot Special Tests  Great Toe Extension Test  TTP along plantarfascia B     Great Toe Extension Test    Comments Negativ B     Transfers   Transfers Sit to Stand   Sit to Stand 6: Modified independent (Device/Increase time)  Requires use of UEs to perform     Ambulation/Gait   Gait Comments Increase lumbar flexion througout movement                 Adult Aquatic Therapy - 12/18/16 0842      Aquatic Therapy Subjective   Subjective Pt had no complaints except for semi tandem stance R leg back during which LBP occurs. exercse Discontinued exercise.          O: Pt entered/exited the pool via ramp with single UE support on rail.  50 ft =1 lap  Exercises performed in 3'6" depth   Seated:leg circles (abduction) , heel raises  2' cued for spreading toes  Sit to stand with cues for less knee valgus 10 reps  Wall stretches:  tractioning of spine in forward fold  10 reps   Mini squats by wall 10 reps  Standing marches with BUE on wall :  cued for higher thighs, landing with [place], less heel 2"    hip abduction circles standing , single UE on rail  10 reps   2 lap L/ R sidestepping with noodles in BUE    Exercises performed in 3'6" depth to 4'0" depth  Backward walking, single UE on rail, cues for toes spread  10 ft x 6    Stretches:  Traction of spine Hip abd/add with knees bent, toes on ground   Explained to move feet first then body when turning               PT Education - 12/17/16 1454    Education provided Yes   Education Details HEP: Towel scrunches, Plantarflexoin against GTB   Person(s) Educated Patient   Methods Explanation;Demonstration   Comprehension Verbalized understanding;Returned demonstration             PT Long Term Goals - 12/17/16 1502      PT LONG TERM GOAL #1   Title Patient will be independent with HEP to continue benefits of therapy after discharge.   Baseline Depenedent with exercise technique and progression   Time 6   Period Weeks   Status New     PT LONG TERM GOAL #2   Title Patient will improve MODI to under 10% to demonstrate improved ability to stand without pain.   Baseline MODI: 46%   Time 6   Period Weeks   Status New     PT LONG TERM GOAL #3   Title Patient will be able to walk without onset of pain to allow for patient to perform ADLs with greater comfort.    Baseline onset of pain as soon as she stands    Time 6   Period Weeks   Status New               Plan - 12/18/16 0854    Clinical Impression Statement Pt tolerate pool session today with minimal complaint of pain. LBP occurred with semi tandem stance with R leg back. Exercise was discontinued. Radiating pain occurred when pt turned her torso without moving her feet first. Provided education on minimizing pain with proper body mechanics with body turns. Address hip abduction strengthening exercises due to presence of genu valgus in sit to stands. Cued excessively for toe abduction   and increased hip flexion during gait due to the presence of shuffle gait pattern and clawed toes. Pt continues to benefit from skilled PT.    Rehab Potential Fair   Clinical Impairments Affecting Rehab Potential (+) highly motivated (-) age and bilateral pain along plantar aspect of the foot   PT Frequency 2x / week   PT Duration 6 weeks   PT Treatment/Interventions Aquatic Therapy;ADLs/Self Care Home Management;Cryotherapy;Electrical Stimulation;Iontophoresis 4mg /ml Dexamethasone;Moist Heat;Traction;Balance training;Therapeutic exercise;Therapeutic activities;Gait training;Stair training;Functional mobility training;Neuromuscular re-education;Patient/family education;Passive range of motion;Manual techniques;Dry needling   PT Next Visit Plan Progress strengthening, improving ankle coordination   PT Home Exercise Plan Towel scrunches, resisted plantarflexion with GTB   Consulted and Agree with Plan of Care Patient      Patient will benefit from skilled therapeutic intervention in order to improve the following deficits and impairments:  Pain, Impaired sensation, Increased fascial restricitons, Decreased coordination, Decreased mobility, Hypermobility, Increased muscle spasms, Postural dysfunction, Decreased range of motion, Decreased endurance, Decreased activity tolerance, Decreased strength, Decreased balance, Difficulty walking  Visit Diagnosis: Pain in left foot  Pain in right foot  Chronic left-sided low back pain, with sciatica presence unspecified  G-Codes - 12/17/16 1542    Functional Assessment Tool Used (Outpatient Only) clinical judgement, MODI, MMT, AROM   Functional Limitation Changing and maintaining body position   Changing and Maintaining Body Position Current Status (K5397) At least 40 percent but less than 60 percent impaired, limited or restricted   Changing and Maintaining Body Position Goal Status (Q7341) At least 1 percent but less than 20 percent impaired,  limited or restricted      Problem List Patient Active Problem List   Diagnosis Date Noted  . Avitaminosis D 06/07/2015  . Back ache 11/22/2013  . Artery disease, cerebral 10/26/2013  . Diverticulitis of colon 10/26/2013  . Acid reflux 10/26/2013  . Calcium blood increased 10/26/2013  . HLD (hyperlipidemia) 10/26/2013    Jerl Mina ,PT, DPT, E-RYT  12/18/2016, 8:55 AM  [place] MAIN [facility] SERVICES [address] [place], [place], [address] Phone: [phone]   Fax:  [phone]  Name: Jenny [name] MRN: [id number] Date of Birth: 01-08-1928

## 2016-12-25 ENCOUNTER — Ambulatory Visit: Payer: Medicare Other

## 2016-12-30 ENCOUNTER — Ambulatory Visit: Payer: Medicare Other | Admitting: Physical Therapy

## 2016-12-30 DIAGNOSIS — M79672 Pain in left foot: Secondary | ICD-10-CM | POA: Diagnosis not present

## 2016-12-30 DIAGNOSIS — M79671 Pain in right foot: Secondary | ICD-10-CM

## 2016-12-30 DIAGNOSIS — G8929 Other chronic pain: Secondary | ICD-10-CM

## 2016-12-30 DIAGNOSIS — M545 Low back pain: Secondary | ICD-10-CM

## 2016-12-30 NOTE — Therapy (Signed)
[place] MAIN [facility] SERVICES [address] [place], [place], [address] Phone: [phone]   Fax:  [phone]  Physical Therapy Treatment  Patient Details  Name: Jenny [name] MRN: [id number] Date of Birth: [birth year] Referring Provider: Sharlet Salina  Encounter Date: 12/30/2016      PT End of Session - 12/30/16 2259    Visit Number 3   Number of Visits 12   Date for PT Re-Evaluation 01/28/17   Authorization Type 3/ 10 G Code   PT Start Time 1030   PT Stop Time 1115   PT Time Calculation (min) 45 min   Activity Tolerance Patient tolerated treatment well   Behavior During Therapy [facility] for tasks assessed/performed      Past Medical History:  Diagnosis Date  . HBP (high blood pressure)   . Reflux   . Skin cancer     Past Surgical History:  Procedure Laterality Date  . BREAST BIOPSY Right   . ESOPHAGOGASTRODUODENOSCOPY (EGD) WITH PROPOFOL N/A 04/26/2015   Procedure: ESOPHAGOGASTRODUODENOSCOPY (EGD) WITH PROPOFOL;  Surgeon: Manya Silvas, MD;  Location: [facility] ENDOSCOPY;  Service: Endoscopy;  Laterality: N/A;  . NECK SURGERY      There were no vitals filed for this visit.      Subjective Assessment - 12/30/16 2257    Subjective Pt reports she threw her back out w/ radiating pain occasionally to L posterior thigh. The pain in the midfoot portions remain constant. Pt has not been doing her HEP as much recommended.    Pertinent History Patient denies PMH; Reports pain in both feet and the back have worsened since the beginning of this year.    Limitations Lifting;Walking;Standing   How long can you stand comfortably? 65min   Diagnostic tests X -Ray   Pain Onset More than a month ago                     Adult Aquatic Therapy - 12/30/16 2259      Aquatic Therapy Subjective   Subjective Pt had no complaints         O: Pt entered/exited the pool via ramp with UE support on rail.  50 ft =1  lap  Exercises performed in 3'6" depth   10 reps x 3 sets  L sidebend with single UE on Rail L modifed sideplank  L hip hike   Stretches:  Forward bend to lengthen spine  Discontinued passive hip flexor stretch due to pt complaint of discomfort in low back ( increased lumbar lordosis)  Trunk rotation R in forward bend to lengthen L QL after hip hike exercise   Walking backward: 2 laps with noodles L/R but difficulty with cue for  longer step length L,  2 laps with kick board , held in front of belly, less difficulty with cue for longer step length L   Seated rest break 5'                    PT Long Term Goals - 12/17/16 1502      PT LONG TERM GOAL #1   Title Patient will be independent with HEP to continue benefits of therapy after discharge.   Baseline Depenedent with exercise technique and progression   Time 6   Period Weeks   Status New     PT LONG TERM GOAL #2   Title Patient will improve MODI to under 10% to demonstrate improved ability to stand without pain.  Baseline MODI: 46%   Time 6   Period Weeks   Status New     PT LONG TERM GOAL #3   Title Patient will be able to walk without onset of pain to allow for patient to perform ADLs with greater comfort.    Baseline onset of pain as soon as she stands    Time 6   Period Weeks   Status New               Plan - 12/30/16 2300    Clinical Impression Statement Noted R thoracolumbar scoliosis which is likely a contributing factor to her LBP and feet pain L >R.  Exercises today addressed her spinal deviations:  strengthen her L QL mm to address the lowered position of her L iliac crest and lengthen her L thoracic region. Pt reported no increased pain with new exercises. Added plantarflexion and dorsiflexion exercises to address her midfoot pain. Provided HEP on paper to promote compliance.  Pt continues to benefit from skilled PT.    Rehab Potential Fair   Clinical Impairments Affecting Rehab  Potential (+) highly motivated (-) age and bilateral pain along plantar aspect of the foot   PT Frequency 2x / week   PT Duration 6 weeks   PT Treatment/Interventions Aquatic Therapy;ADLs/Self Care Home Management;Cryotherapy;Electrical Stimulation;Iontophoresis 4mg /ml Dexamethasone;Moist Heat;Traction;Balance training;Therapeutic exercise;Therapeutic activities;Gait training;Stair training;Functional mobility training;Neuromuscular re-education;Patient/family education;Passive range of motion;Manual techniques;Dry needling   PT Next Visit Plan Progress strengthening, improving ankle coordination   PT Home Exercise Plan Towel scrunches, resisted plantarflexion with GTB   Consulted and Agree with Plan of Care Patient      Patient will benefit from skilled therapeutic intervention in order to improve the following deficits and impairments:  Pain, Impaired sensation, Increased fascial restricitons, Decreased coordination, Decreased mobility, Hypermobility, Increased muscle spasms, Postural dysfunction, Decreased range of motion, Decreased endurance, Decreased activity tolerance, Decreased strength, Decreased balance, Difficulty walking  Visit Diagnosis: Pain in left foot  Pain in right foot  Chronic left-sided low back pain, with sciatica presence unspecified     Problem List Patient Active Problem List   Diagnosis Date Noted  . Avitaminosis D 06/07/2015  . Back ache 11/22/2013  . Artery disease, cerebral 10/26/2013  . Diverticulitis of colon 10/26/2013  . Acid reflux 10/26/2013  . Calcium blood increased 10/26/2013  . HLD (hyperlipidemia) 10/26/2013    Jerl Mina ,PT, DPT, E-RYT  12/30/2016, 11:03 PM  [place] MAIN [facility] SERVICES [address] [place], [place], [address] Phone: [phone]   Fax:  [phone]  Name: SIERA BEYERSDORF MRN: [id number] Date of Birth: 10-02-1927

## 2017-01-01 ENCOUNTER — Ambulatory Visit: Payer: Medicare Other

## 2017-01-01 DIAGNOSIS — M79672 Pain in left foot: Secondary | ICD-10-CM

## 2017-01-01 DIAGNOSIS — M79671 Pain in right foot: Secondary | ICD-10-CM

## 2017-01-01 DIAGNOSIS — M545 Low back pain: Secondary | ICD-10-CM

## 2017-01-01 DIAGNOSIS — G8929 Other chronic pain: Secondary | ICD-10-CM

## 2017-01-01 NOTE — Therapy (Signed)
[place] [facility] [address], [place], [address] Phone: [phone]   Fax:  [phone]  Physical Therapy Treatment  Patient Details  Name: Jenny [name] MRN: [id number] Date of Birth: 1927-10-06 Referring Provider: Sharlet Salina  Encounter Date: 01/01/2017      PT End of Session - 01/01/17 1056    Visit Number 4   Number of Visits 12   Date for PT Re-Evaluation 01/28/17   Authorization Type 4/ 10 G Code   PT Start Time 1030   PT Stop Time 1115   PT Time Calculation (min) 45 min   Activity Tolerance Patient tolerated treatment well   Behavior During Therapy [facility] for tasks assessed/performed      Past Medical History:  Diagnosis Date  . HBP (high blood pressure)   . Reflux   . Skin cancer     Past Surgical History:  Procedure Laterality Date  . BREAST BIOPSY Right   . ESOPHAGOGASTRODUODENOSCOPY (EGD) WITH PROPOFOL N/A 04/26/2015   Procedure: ESOPHAGOGASTRODUODENOSCOPY (EGD) WITH PROPOFOL;  Surgeon: Manya Silvas, MD;  Location: [facility] ENDOSCOPY;  Service: Endoscopy;  Laterality: N/A;  . NECK SURGERY      There were no vitals filed for this visit.      Subjective Assessment - 01/01/17 1034    Subjective Patient reports she continues to have pain with standing and walking. Patient reports no increase in pain with sitting. Patient reports she has questions on HEP.    Pertinent History Patient denies PMH; Reports pain in both feet and the back have worsened since the beginning of this year.    Limitations Lifting;Walking;Standing   How long can you stand comfortably? 12min   Diagnostic tests X -Ray   Currently in Pain? No/denies   Pain Onset More than a month ago     TREATMENT:  Therapeutic Exercise: Prone on elbows -- x 27min during modalities to promote improved lumbar extension and lumbar positioning with standing Prone press ups -- x 10 after modalities to improve lumbar positioning   Seated Plantarflexion/toe flexion -- x 10 in sitting to improve gastroc strength and foot strength  Manual therapy: STM with patient positioned in prone to lumbar extensors, QL, multifidus, and thoracic extensors to decrease pain and spasms with utilizing deep and superficial techniques. Performed STM to the plantarfascia and the deep flexors of the feet bilaterally to decrease pain and spasms.  Modalities: High Volt continuous with 4 large electrodes placed bilaterally across the low back to decrease increased pain and spasms. Patient is positioned in prone and modalities were performed for 10 min.   Patient demonstrates decreased pain and spasms after treatment session.            PT Education - 01/01/17 1056    Education provided Yes   Education Details Reinforced HEP; form/technique and cueing on positioning   Person(s) Educated Patient   Methods Explanation;Demonstration   Comprehension Returned demonstration;Verbalized understanding             PT Long Term Goals - 12/17/16 1502      PT LONG TERM GOAL #1   Title Patient will be independent with HEP to continue benefits of therapy after discharge.   Baseline Depenedent with exercise technique and progression   Time 6   Period Weeks   Status New     PT LONG TERM GOAL #2   Title Patient will improve MODI to under 10% to demonstrate improved ability to stand  without pain.   Baseline MODI: 46%   Time 6   Period Weeks   Status New     PT LONG TERM GOAL #3   Title Patient will be able to walk without onset of pain to allow for patient to perform ADLs with greater comfort.    Baseline onset of pain as soon as she stands    Time 6   Period Weeks   Status New               Plan - 01/01/17 1121    Clinical Impression Statement Patient demonstrates decrease in pain after performing prone lumbar extensions and modalities indicating improved motor control and lumbar positioning. Patient demonstrates decreased  pain along her lumbar and B feet after performing manual therapy indicating decreased pain and spasms. Patient continues to demonstrate increased muscle spasms and pain with increased standing. Patient will benefit from further skilled therapy to return to prior level of function.    Clinical Impairments Affecting Rehab Potential (+) highly motivated (-) age and bilateral pain along plantar aspect of the foot   PT Frequency 2x / week   PT Duration 6 weeks   PT Treatment/Interventions Aquatic Therapy;ADLs/Self Care Home Management;Cryotherapy;Electrical Stimulation;Iontophoresis 4mg /ml Dexamethasone;Moist Heat;Traction;Balance training;Therapeutic exercise;Therapeutic activities;Gait training;Stair training;Functional mobility training;Neuromuscular re-education;Patient/family education;Passive range of motion;Manual techniques;Dry needling   PT Next Visit Plan Progress strengthening, improving ankle coordination   PT Home Exercise Plan Towel scrunches, resisted plantarflexion with GTB   Consulted and Agree with Plan of Care Patient      Patient will benefit from skilled therapeutic intervention in order to improve the following deficits and impairments:  Pain, Impaired sensation, Increased fascial restricitons, Decreased coordination, Decreased mobility, Hypermobility, Increased muscle spasms, Postural dysfunction, Decreased range of motion, Decreased endurance, Decreased activity tolerance, Decreased strength, Decreased balance, Difficulty walking  Visit Diagnosis: Pain in left foot  Pain in right foot  Chronic left-sided low back pain, with sciatica presence unspecified     Problem List Patient Active Problem List   Diagnosis Date Noted  . Avitaminosis D 06/07/2015  . Back ache 11/22/2013  . Artery disease, cerebral 10/26/2013  . Diverticulitis of colon 10/26/2013  . Acid reflux 10/26/2013  . Calcium blood increased 10/26/2013  . HLD (hyperlipidemia) 10/26/2013    Blythe Stanford,  PT DPT 01/01/2017, 12:08 PM  [place] [facility] [address] [place], [address] Phone: [phone]   Fax:  [phone]  Name: Jenny [name] MRN: [id number] Date of Birth: [birth year]

## 2017-01-06 ENCOUNTER — Ambulatory Visit: Payer: Medicare Other | Attending: Physical Medicine and Rehabilitation | Admitting: Physical Therapy

## 2017-01-06 DIAGNOSIS — M545 Low back pain: Secondary | ICD-10-CM | POA: Insufficient documentation

## 2017-01-06 DIAGNOSIS — G8929 Other chronic pain: Secondary | ICD-10-CM | POA: Insufficient documentation

## 2017-01-06 DIAGNOSIS — M79671 Pain in right foot: Secondary | ICD-10-CM | POA: Insufficient documentation

## 2017-01-06 DIAGNOSIS — M79672 Pain in left foot: Secondary | ICD-10-CM | POA: Insufficient documentation

## 2017-01-07 NOTE — Therapy (Signed)
[place] MAIN [facility] SERVICES [address] [place], [place], [address] Phone: [phone]   Fax:  [phone]  Patient Details  Name: Jenny [name] MRN: [id number] Date of Birth: 12/09/1927 Referring Provider:  Sharlet Salina, MD  Encounter Date: 01/06/2017  Aquatic PT was deferred to day as pt expressed concern about leaving her husband at home  during this early morning appt. He suffers from [place] and is without assistance during this hour. Pt also expressed having more benefit from her land PT sessions and preferred get scheduled with more land appts while  discontinuing aquatic PT. Pt plans to continue performing HEP targeting her musculoskeletal imbalances from her scoliosis.   No charged were billed today. Land appts have been scheduled for pt to continue with her POC.   Jerl Mina ,PT, DPT, E-RYT  01/07/2017, 1:39 AM  [place] MAIN [facility] SERVICES [address] [place], [place], [address] Phone: [phone]   Fax:  [phone]

## 2017-01-08 ENCOUNTER — Ambulatory Visit: Payer: Medicare Other | Admitting: Physical Therapy

## 2017-01-13 ENCOUNTER — Ambulatory Visit: Payer: Medicare Other | Admitting: Physical Therapy

## 2017-01-14 ENCOUNTER — Ambulatory Visit: Payer: Medicare Other

## 2017-01-14 DIAGNOSIS — M79671 Pain in right foot: Secondary | ICD-10-CM | POA: Diagnosis present

## 2017-01-14 DIAGNOSIS — M545 Low back pain: Secondary | ICD-10-CM

## 2017-01-14 DIAGNOSIS — M79672 Pain in left foot: Secondary | ICD-10-CM | POA: Diagnosis present

## 2017-01-14 DIAGNOSIS — G8929 Other chronic pain: Secondary | ICD-10-CM | POA: Diagnosis present

## 2017-01-14 NOTE — Therapy (Signed)
[place] [facility] [address], [place], [address] Phone: [phone]   Fax:  [phone]  Physical Therapy Treatment  Patient Details  Name: Jenny [name] MRN: [id number] Date of Birth: 08/01/1927 Referring Provider: Sharlet Salina  Encounter Date: 01/14/2017      PT End of Session - 01/14/17 1042    Visit Number 5   Number of Visits 12   Date for PT Re-Evaluation 01/28/17   Authorization Type 5/ 10 G Code   PT Start Time 1030   PT Stop Time 1115   PT Time Calculation (min) 45 min   Activity Tolerance Patient tolerated treatment well   Behavior During Therapy [facility] for tasks assessed/performed      Past Medical History:  Diagnosis Date  . HBP (high blood pressure)   . Reflux   . Skin cancer     Past Surgical History:  Procedure Laterality Date  . BREAST BIOPSY Right   . ESOPHAGOGASTRODUODENOSCOPY (EGD) WITH PROPOFOL N/A 04/26/2015   Procedure: ESOPHAGOGASTRODUODENOSCOPY (EGD) WITH PROPOFOL;  Surgeon: Manya Silvas, MD;  Location: [facility] ENDOSCOPY;  Service: Endoscopy;  Laterality: N/A;  . NECK SURGERY      There were no vitals filed for this visit.      Subjective Assessment - 01/14/17 1029    Subjective Patient reports her pain has not been changed since the beginning of therapy. Patient feels she has increased discomfort in her feet and low back when walking/standing.    Pertinent History Patient denies PMH; Reports pain in both feet and the back have worsened since the beginning of this year.    Limitations Lifting;Walking;Standing   How long can you stand comfortably? 32min   Diagnostic tests X -Ray   Currently in Pain? No/denies   Pain Onset More than a month ago        TREATMENT: Therapeutic Exercise Hip machine - hip abduction - 2 x 10 40# B; hip extension - 2 x 10 B 70#, 85# first set Sit to stands from raised table - 2 x 10 without use of UEs Standing marches on airex pad -  2 x 20 with focus on performing slower for core engagement Side stepping up and over airex pad - x15  Mini squats off of airex pad - 2 x 15  Standing hip ER in standing - 2 x 10 Leg Press B Les - 2 x 10   Patinet demonstratres increased fatigue at end of the session.       PT Education - 01/14/17 1037    Education provided Yes   Education Details form/technique with exercise    Person(s) Educated Patient   Methods Explanation;Demonstration   Comprehension Verbalized understanding;Returned demonstration             PT Long Term Goals - 12/17/16 1502      PT LONG TERM GOAL #1   Title Patient will be independent with HEP to continue benefits of therapy after discharge.   Baseline Depenedent with exercise technique and progression   Time 6   Period Weeks   Status New     PT LONG TERM GOAL #2   Title Patient will improve MODI to under 10% to demonstrate improved ability to stand without pain.   Baseline MODI: 46%   Time 6   Period Weeks   Status New     PT LONG TERM GOAL #3   Title Patient will be able to walk without onset  of pain to allow for patient to perform ADLs with greater comfort.    Baseline onset of pain as soon as she stands    Time 6   Period Weeks   Status New               Plan - 01/14/17 1058    Clinical Impression Statement Patient demonstrates no increase in pain throughout session and focused session on improving hip and LE strengthening and stabilization to improve low back symptoms and greater ability to perform sit to stands. Patient demonstrates difficulty with performing exercises on the L side indicating increasede weakness and poor motor control. Patient will benefit from further skilled therapy to return to prior level of function.    Clinical Impairments Affecting Rehab Potential (+) highly motivated (-) age and bilateral pain along plantar aspect of the foot   PT Frequency 2x / week   PT Duration 6 weeks   PT  Treatment/Interventions Aquatic Therapy;ADLs/Self Care Home Management;Cryotherapy;Electrical Stimulation;Iontophoresis 4mg /ml Dexamethasone;Moist Heat;Traction;Balance training;Therapeutic exercise;Therapeutic activities;Gait training;Stair training;Functional mobility training;Neuromuscular re-education;Patient/family education;Passive range of motion;Manual techniques;Dry needling   PT Next Visit Plan Progress strengthening, improving ankle coordination   PT Home Exercise Plan Towel scrunches, resisted plantarflexion with GTB   Consulted and Agree with Plan of Care Patient      Patient will benefit from skilled therapeutic intervention in order to improve the following deficits and impairments:  Pain, Impaired sensation, Increased fascial restricitons, Decreased coordination, Decreased mobility, Hypermobility, Increased muscle spasms, Postural dysfunction, Decreased range of motion, Decreased endurance, Decreased activity tolerance, Decreased strength, Decreased balance, Difficulty walking  Visit Diagnosis: Pain in left foot  Pain in right foot  Chronic left-sided low back pain, with sciatica presence unspecified     Problem List Patient Active Problem List   Diagnosis Date Noted  . Avitaminosis D 06/07/2015  . Back ache 11/22/2013  . Artery disease, cerebral 10/26/2013  . Diverticulitis of colon 10/26/2013  . Acid reflux 10/26/2013  . Calcium blood increased 10/26/2013  . HLD (hyperlipidemia) 10/26/2013    Blythe Stanford, PT DPT 01/14/2017, 12:02 PM  [place] [facility] [address] [place], [address] Phone: [phone]   Fax:  [phone]  Name: Jenny [name] MRN: [id number] Date of Birth: 10-Mar-1928

## 2017-01-15 ENCOUNTER — Ambulatory Visit: Payer: Medicare Other | Admitting: Physical Therapy

## 2017-01-19 ENCOUNTER — Ambulatory Visit: Payer: Medicare Other

## 2017-01-19 DIAGNOSIS — M79672 Pain in left foot: Secondary | ICD-10-CM | POA: Diagnosis not present

## 2017-01-19 DIAGNOSIS — M79671 Pain in right foot: Secondary | ICD-10-CM

## 2017-01-19 DIAGNOSIS — M545 Low back pain: Secondary | ICD-10-CM

## 2017-01-19 DIAGNOSIS — G8929 Other chronic pain: Secondary | ICD-10-CM

## 2017-01-19 NOTE — Therapy (Signed)
[place] [facility] [address], [place], [address] Phone: [phone]   Fax:  [phone]  Physical Therapy Treatment  Patient Details  Name: Jenny [name] MRN: [id number] Date of Birth: 08-Apr-1928 Referring Provider: Sharlet Salina  Encounter Date: 01/19/2017      PT End of Session - 01/19/17 1122    Visit Number 6   Number of Visits 12   Date for PT Re-Evaluation 01/28/17   Authorization Type 6/ 10 G Code   PT Start Time 2951   PT Stop Time 1115   PT Time Calculation (min) 30 min   Activity Tolerance Patient tolerated treatment well   Behavior During Therapy [facility] for tasks assessed/performed      Past Medical History:  Diagnosis Date  . HBP (high blood pressure)   . Reflux   . Skin cancer     Past Surgical History:  Procedure Laterality Date  . BREAST BIOPSY Right   . ESOPHAGOGASTRODUODENOSCOPY (EGD) WITH PROPOFOL N/A 04/26/2015   Procedure: ESOPHAGOGASTRODUODENOSCOPY (EGD) WITH PROPOFOL;  Surgeon: Manya Silvas, MD;  Location: [facility] ENDOSCOPY;  Service: Endoscopy;  Laterality: N/A;  . NECK SURGERY      There were no vitals filed for this visit.      Subjective Assessment - 01/19/17 1101    Subjective Patient reports  she feels more limber after therapy but reports it only lasts for an hour.    Pertinent History Patient denies PMH; Reports pain in both feet and the back have worsened since the beginning of this year.    Limitations Lifting;Walking;Standing   How long can you stand comfortably? 53min   Diagnostic tests X -Ray   Currently in Pain? No/denies   Pain Onset More than a month ago        TREATMENT: Therapeutic Exercise Hip machine - hip abduction - 3 x 10 40# B;   Ball Toss with single leg stance rotations with 4# ball --   Mulitifidus crunch-x 10  Standing hip/lumbar  extension - 2 x 10 Standing rotational ball lifts - x10 Mini squats off of airex pad -  x 15  Sit to  stands - x 10    Patinet demonstratres increased LE fatigue at end of the session.          PT Education - 01/19/17 1122    Education provided Yes   Education Details form/technique with exercise   Person(s) Educated Patient   Methods Explanation;Demonstration   Comprehension Verbalized understanding;Returned demonstration             PT Long Term Goals - 12/17/16 1502      PT LONG TERM GOAL #1   Title Patient will be independent with HEP to continue benefits of therapy after discharge.   Baseline Depenedent with exercise technique and progression   Time 6   Period Weeks   Status New     PT LONG TERM GOAL #2   Title Patient will improve MODI to under 10% to demonstrate improved ability to stand without pain.   Baseline MODI: 46%   Time 6   Period Weeks   Status New     PT LONG TERM GOAL #3   Title Patient will be able to walk without onset of pain to allow for patient to perform ADLs with greater comfort.    Baseline onset of pain as soon as she stands    Time 6   Period Weeks   Status  New               Plan - 01/19/17 1123    Clinical Impression Statement Conitued to focus on improving lumbar extension and LE strength to improve low back pain and function when standing. Patient deonstrates decreased coordination with performing bending requiring frequent cueing on positioning. Patient will benefit from further skilled therapy to return to prior level of function.    Clinical Impairments Affecting Rehab Potential (+) highly motivated (-) age and bilateral pain along plantar aspect of the foot   PT Frequency 2x / week   PT Duration 6 weeks   PT Treatment/Interventions Aquatic Therapy;ADLs/Self Care Home Management;Cryotherapy;Electrical Stimulation;Iontophoresis 4mg /ml Dexamethasone;Moist Heat;Traction;Balance training;Therapeutic exercise;Therapeutic activities;Gait training;Stair training;Functional mobility training;Neuromuscular  re-education;Patient/family education;Passive range of motion;Manual techniques;Dry needling   PT Next Visit Plan Progress strengthening, improving ankle coordination   PT Home Exercise Plan Towel scrunches, resisted plantarflexion with GTB   Consulted and Agree with Plan of Care Patient      Patient will benefit from skilled therapeutic intervention in order to improve the following deficits and impairments:  Pain, Impaired sensation, Increased fascial restricitons, Decreased coordination, Decreased mobility, Hypermobility, Increased muscle spasms, Postural dysfunction, Decreased range of motion, Decreased endurance, Decreased activity tolerance, Decreased strength, Decreased balance, Difficulty walking  Visit Diagnosis: Pain in left foot  Pain in right foot  Chronic left-sided low back pain, with sciatica presence unspecified     Problem List Patient Active Problem List   Diagnosis Date Noted  . Avitaminosis D 06/07/2015  . Back ache 11/22/2013  . Artery disease, cerebral 10/26/2013  . Diverticulitis of colon 10/26/2013  . Acid reflux 10/26/2013  . Calcium blood increased 10/26/2013  . HLD (hyperlipidemia) 10/26/2013    Blythe Stanford, PT DPT 01/19/2017, 11:29 AM  [place] [facility] [address], [place], [address] Phone: [phone]   Fax:  [phone]  Name: TERICA YOGI MRN: [id number] Date of Birth: December 05, 1927

## 2017-01-21 ENCOUNTER — Ambulatory Visit: Payer: Medicare Other

## 2017-01-27 ENCOUNTER — Ambulatory Visit: Payer: Medicare Other

## 2017-01-27 DIAGNOSIS — M79672 Pain in left foot: Secondary | ICD-10-CM

## 2017-01-27 DIAGNOSIS — M79671 Pain in right foot: Secondary | ICD-10-CM

## 2017-01-27 DIAGNOSIS — M545 Low back pain: Secondary | ICD-10-CM

## 2017-01-27 DIAGNOSIS — G8929 Other chronic pain: Secondary | ICD-10-CM

## 2017-01-27 NOTE — Therapy (Signed)
[place] [facility] [address] [place], [address] Phone: [phone]   Fax:  [phone]  Physical Therapy Treatment  Patient Details  Name: Jenny [name] MRN: [id number] Date of Birth: 1927-07-20 Referring Provider: Sharlet Salina  Encounter Date: 01/27/2017      PT End of Session - 01/27/17 1536    Visit Number 7   Number of Visits 12   Date for PT Re-Evaluation 01/28/17   Authorization Type 7/ 10 G Code   PT Start Time 1430   PT Stop Time 1515   PT Time Calculation (min) 45 min   Activity Tolerance Patient tolerated treatment well   Behavior During Therapy [facility] for tasks assessed/performed      Past Medical History:  Diagnosis Date  . HBP (high blood pressure)   . Reflux   . Skin cancer     Past Surgical History:  Procedure Laterality Date  . BREAST BIOPSY Right   . ESOPHAGOGASTRODUODENOSCOPY (EGD) WITH PROPOFOL N/A 04/26/2015   Procedure: ESOPHAGOGASTRODUODENOSCOPY (EGD) WITH PROPOFOL;  Surgeon: Manya Silvas, MD;  Location: [facility] ENDOSCOPY;  Service: Endoscopy;  Laterality: N/A;  . NECK SURGERY      There were no vitals filed for this visit.      Subjective Assessment - 01/27/17 1437    Subjective Patient reports she does not feel any better in her feet, but reports improvement of symptoms in her back since the start of therapy.    Pertinent History Patient denies PMH; Reports pain in both feet and the back have worsened since the beginning of this year.    Limitations Lifting;Walking;Standing   How long can you stand comfortably? 50min   Diagnostic tests X -Ray   Currently in Pain? No/denies   Pain Onset More than a month ago       TREATMENT: Therapeutic Exercise Plantarflexion with GTB - x 20 B Hip extension in standing - x 20 B Isometrics toe flexion in long sitting - 5 sec holds, x 10  Toe Flexion against GTB - x 20  Heel raises off of 3" step - x 20  CKC knee to wall  stretch towards the wall for improvement in dorsiflexion - 5 sec holds x 10  Walking with focus on heel strike - 181ft with frequent cueing Ambulation with heel lift to decrease pain along the plantar aspect of the foot (heel lifts given to patient for continuous use at home   Patient demonstrates increased fatigue at end of the session.        PT Education - 01/27/17 1536    Education provided Yes   Education Details Heel strike, verbal and tactile cueing frequent throughout session   Person(s) Educated Patient   Methods Explanation;Demonstration   Comprehension Verbalized understanding;Returned demonstration             PT Long Term Goals - 12/17/16 1502      PT LONG TERM GOAL #1   Title Patient will be independent with HEP to continue benefits of therapy after discharge.   Baseline Depenedent with exercise technique and progression   Time 6   Period Weeks   Status New     PT LONG TERM GOAL #2   Title Patient will improve MODI to under 10% to demonstrate improved ability to stand without pain.   Baseline MODI: 46%   Time 6   Period Weeks   Status New     PT LONG TERM GOAL #3  Title Patient will be able to walk without onset of pain to allow for patient to perform ADLs with greater comfort.    Baseline onset of pain as soon as she stands    Time 6   Period Weeks   Status New               Plan - 01/27/17 1537    Clinical Impression Statement Patient reports improvement with lumbar pain but continues to demonstrate increased pain along the plantar aspect of B feet. Trialed 2 heel lifts to be placed in feet bilaterally, patient reports decrease in pain when walking after placement; gave patient heel lifts to try in shoes until next visit. Plan to continues treat lumbar and foot pain for patient to return to prior level of function.    Clinical Impairments Affecting Rehab Potential (+) highly motivated (-) age and bilateral pain along plantar aspect of the  foot   PT Frequency 2x / week   PT Duration 6 weeks   PT Treatment/Interventions Aquatic Therapy;ADLs/Self Care Home Management;Cryotherapy;Electrical Stimulation;Iontophoresis 4mg /ml Dexamethasone;Moist Heat;Traction;Balance training;Therapeutic exercise;Therapeutic activities;Gait training;Stair training;Functional mobility training;Neuromuscular re-education;Patient/family education;Passive range of motion;Manual techniques;Dry needling   PT Next Visit Plan Progress strengthening, improving ankle coordination   PT Home Exercise Plan Towel scrunches, resisted plantarflexion with GTB   Consulted and Agree with Plan of Care Patient      Patient will benefit from skilled therapeutic intervention in order to improve the following deficits and impairments:  Pain, Impaired sensation, Increased fascial restricitons, Decreased coordination, Decreased mobility, Hypermobility, Increased muscle spasms, Postural dysfunction, Decreased range of motion, Decreased endurance, Decreased activity tolerance, Decreased strength, Decreased balance, Difficulty walking  Visit Diagnosis: Pain in left foot  Pain in right foot  Chronic left-sided low back pain, with sciatica presence unspecified     Problem List Patient Active Problem List   Diagnosis Date Noted  . Avitaminosis D 06/07/2015  . Back ache 11/22/2013  . Artery disease, cerebral 10/26/2013  . Diverticulitis of colon 10/26/2013  . Acid reflux 10/26/2013  . Calcium blood increased 10/26/2013  . HLD (hyperlipidemia) 10/26/2013    Blythe Stanford, PT DPT 01/27/2017, 5:00 PM  [place] [facility] [address], [place], [address] Phone: [phone]   Fax:  [phone]  Name: Jenny [name] MRN: [id number] Date of Birth: 17-Oct-1927

## 2017-01-29 ENCOUNTER — Ambulatory Visit: Payer: Medicare Other

## 2017-01-29 DIAGNOSIS — M79672 Pain in left foot: Secondary | ICD-10-CM | POA: Diagnosis not present

## 2017-01-29 DIAGNOSIS — M79671 Pain in right foot: Secondary | ICD-10-CM

## 2017-01-29 DIAGNOSIS — G8929 Other chronic pain: Secondary | ICD-10-CM

## 2017-01-29 DIAGNOSIS — M545 Low back pain: Secondary | ICD-10-CM

## 2017-01-29 NOTE — Therapy (Signed)
[place] [facility] [address] [place], [address] Phone: [phone]   Fax:  [phone]  Physical Therapy Treatment  Patient Details  Name: Jenny [name] MRN: [id number] Date of Birth: 01-18-1928 Referring Provider: Sharlet Salina  Encounter Date: 01/29/2017      PT End of Session - 01/29/17 1718    Visit Number 8   Number of Visits 12   Date for PT Re-Evaluation 02/26/17   Authorization Type 8/ 10 G Code   PT Start Time 0076   PT Stop Time 1700   PT Time Calculation (min) 45 min   Activity Tolerance Patient tolerated treatment well   Behavior During Therapy [facility] for tasks assessed/performed      Past Medical History:  Diagnosis Date  . HBP (high blood pressure)   . Reflux   . Skin cancer     Past Surgical History:  Procedure Laterality Date  . BREAST BIOPSY Right   . ESOPHAGOGASTRODUODENOSCOPY (EGD) WITH PROPOFOL N/A 04/26/2015   Procedure: ESOPHAGOGASTRODUODENOSCOPY (EGD) WITH PROPOFOL;  Surgeon: Manya Silvas, MD;  Location: [facility] ENDOSCOPY;  Service: Endoscopy;  Laterality: N/A;  . NECK SURGERY      There were no vitals filed for this visit.      Subjective Assessment - 01/29/17 1621    Subjective Patient reports she has increased foot pain where she's unable to walk after an hour of standing/walking.   Pertinent History Patient denies PMH; Reports pain in both feet and the back have worsened since the beginning of this year.    Limitations Lifting;Walking;Standing   How long can you stand comfortably? 54min   Diagnostic tests X -Ray   Currently in Pain? No/denies   Pain Onset More than a month ago           TREATMENT: Therapeutic Exercise Arch Lifts with frequent cueing on arch positioning throughout performance - x 30 B Performed in B standing, Single leg stance and sitting Isometrics toe flexion in long sitting - 5 sec holds, x 10  Toe Flexion against GTB - x 20  Heel  raises off of 3" step - x 20 B (Up with 2 heels down with 1)  Manual therapy: STM to the bottom of B feet along the plantarfascia to decrease increase pain and spasms. Active Release techniques performed along both her feet along flexor digitorum mini.    Patient demonstrates no increase in pain at end of session        PT Education - 01/29/17 1717    Education provided Yes   Education Details SIngle leg heel raises off of 3" step   Person(s) Educated Patient   Methods Explanation;Demonstration   Comprehension Verbalized understanding;Returned demonstration             PT Long Term Goals - 01/29/17 1719      PT LONG TERM GOAL #1   Title Patient will be independent with HEP to continue benefits of therapy after discharge.   Baseline Depenedent with exercise technique and progression; Requires frequent cues for correct exercise performance   Time 6   Period Weeks   Status On-going     PT LONG TERM GOAL #2   Title Patient will improve MODI to under 10% to demonstrate improved ability to stand without pain.   Baseline MODI: 46%; 01/30/2016: 40%   Time 6   Period Weeks   Status On-going     PT LONG TERM GOAL #3  Title Patient will be able to walk without onset of pain to allow for patient to perform ADLs with greater comfort.    Baseline onset of pain as soon as she stands; 01/29/2017: increased pain as she stands but more in feet than lumbar spine   Time 6   Period Weeks   Status On-going               Plan - 01/29/17 1758    Clinical Impression Statement Patient demonstrates improvement in lumbar pain but continues to have considerable pain along B lower extremities. Patient demonstrates slight improvement with MODI but continues to demonstrates significant pain. Patient requires frequent cueing throughout session to perform exercises with proper technique. Patient will benefit from further skilled therapy to return to prior level of function.    Clinical  Impairments Affecting Rehab Potential (+) highly motivated (-) age and bilateral pain along plantar aspect of the foot   PT Frequency 2x / week   PT Duration 6 weeks   PT Treatment/Interventions Aquatic Therapy;ADLs/Self Care Home Management;Cryotherapy;Electrical Stimulation;Iontophoresis 4mg /ml Dexamethasone;Moist Heat;Traction;Balance training;Therapeutic exercise;Therapeutic activities;Gait training;Stair training;Functional mobility training;Neuromuscular re-education;Patient/family education;Passive range of motion;Manual techniques;Dry needling   PT Next Visit Plan Progress strengthening, improving ankle coordination   PT Home Exercise Plan Towel scrunches, resisted plantarflexion with GTB   Consulted and Agree with Plan of Care Patient      Patient will benefit from skilled therapeutic intervention in order to improve the following deficits and impairments:  Pain, Impaired sensation, Increased fascial restricitons, Decreased coordination, Decreased mobility, Hypermobility, Increased muscle spasms, Postural dysfunction, Decreased range of motion, Decreased endurance, Decreased activity tolerance, Decreased strength, Decreased balance, Difficulty walking  Visit Diagnosis: Pain in right foot - Plan: PT plan of care cert/re-cert  Chronic left-sided low back pain, with sciatica presence unspecified - Plan: PT plan of care cert/re-cert  Pain in left foot - Plan: PT plan of care cert/re-cert     Problem List Patient Active Problem List   Diagnosis Date Noted  . Avitaminosis D 06/07/2015  . Back ache 11/22/2013  . Artery disease, cerebral 10/26/2013  . Diverticulitis of colon 10/26/2013  . Acid reflux 10/26/2013  . Calcium blood increased 10/26/2013  . HLD (hyperlipidemia) 10/26/2013    Blythe Stanford, PT DPT 01/29/2017, 6:07 PM  [place] [facility] [address] [place], [address] Phone: [phone]   Fax:   [phone]  Name: Jenny [name] MRN: [id number] Date of Birth: Aug 13, 1927

## 2017-02-03 ENCOUNTER — Ambulatory Visit: Payer: Medicare Other

## 2017-02-03 DIAGNOSIS — M79671 Pain in right foot: Secondary | ICD-10-CM

## 2017-02-03 DIAGNOSIS — M79672 Pain in left foot: Secondary | ICD-10-CM

## 2017-02-03 DIAGNOSIS — M545 Low back pain: Secondary | ICD-10-CM

## 2017-02-03 DIAGNOSIS — G8929 Other chronic pain: Secondary | ICD-10-CM

## 2017-02-03 NOTE — Therapy (Signed)
[place] [facility] [address] [place], [address] Phone: [phone]   Fax:  [phone]  Physical Therapy Treatment  Patient Details  Name: Jenny [name] MRN: [id number] Date of Birth: 03-20-1928 Referring Provider: Sharlet Salina  Encounter Date: 02/03/2017      PT End of Session - 02/03/17 1403    Visit Number 9   Number of Visits 12   Date for PT Re-Evaluation 02/26/17   Authorization Type 9/ 10 G Code   PT Start Time 1117   PT Stop Time 1200   PT Time Calculation (min) 43 min   Activity Tolerance Patient tolerated treatment well   Behavior During Therapy [facility] for tasks assessed/performed      Past Medical History:  Diagnosis Date  . HBP (high blood pressure)   . Reflux   . Skin cancer     Past Surgical History:  Procedure Laterality Date  . BREAST BIOPSY Right   . ESOPHAGOGASTRODUODENOSCOPY (EGD) WITH PROPOFOL N/A 04/26/2015   Procedure: ESOPHAGOGASTRODUODENOSCOPY (EGD) WITH PROPOFOL;  Surgeon: Manya Silvas, MD;  Location: [facility] ENDOSCOPY;  Service: Endoscopy;  Laterality: N/A;  . NECK SURGERY      There were no vitals filed for this visit.      Subjective Assessment - 02/03/17 1402    Subjective Patient reports decreased foot pain after the last treatment session which lasted for 1 day.   Pertinent History Patient denies PMH; Reports pain in both feet and the back have worsened since the beginning of this year.    Limitations Lifting;Walking;Standing   How long can you stand comfortably? 9min   Diagnostic tests X -Ray   Currently in Pain? No/denies   Pain Onset More than a month ago      TREATMENT  TREATMENT: Therapeutic Exercise Heel raises off of 3" step - 2 x 20 B (Up with 2 heels down with 1) Single leg stance on airex - 3 x 30sec Marches on airex pad - x 20 with intermittent UE support with focus on toe flexor activation with exercise performance Toe walking - x121ft   Heel walking - x67ft with intermittent UE support   Manual therapy: STM to the bottom of B feet along the plantarfascia and extensors of the feet to decrease increase pain and spasms. Active Release techniques performed along both her feet along flexor digitorum mini.     Patient demonstrates no increase in pain at end of session        PT Education - 02/03/17 1403    Education provided Yes   Education Details Reviewed HEP; form/technique with exercises performed   Person(s) Educated Patient   Methods Explanation;Demonstration   Comprehension Verbalized understanding;Returned demonstration             PT Long Term Goals - 01/29/17 1719      PT LONG TERM GOAL #1   Title Patient will be independent with HEP to continue benefits of therapy after discharge.   Baseline Depenedent with exercise technique and progression; Requires frequent cues for correct exercise performance   Time 6   Period Weeks   Status On-going     PT LONG TERM GOAL #2   Title Patient will improve MODI to under 10% to demonstrate improved ability to stand without pain.   Baseline MODI: 46%; 01/30/2016: 40%   Time 6   Period Weeks   Status On-going     PT LONG TERM GOAL #3   Title  Patient will be able to walk without onset of pain to allow for patient to perform ADLs with greater comfort.    Baseline onset of pain as soon as she stands; 01/29/2017: increased pain as she stands but more in feet than lumbar spine   Time 6   Period Weeks   Status On-going               Plan - 02/03/17 1404    Clinical Impression Statement Patient demonstrates increased difficulty and pain with performance of eccentric heel lowering off of a step. Patient demonstrates poor motor control in the foot and lower extremity requiring frequent verbal and tactile cues to perform exercises with correct technique. Patient demonstrates increased pain and spasms along lateral maleoli B and will benefit from further skilled  therapy focused on improving current limitations to return to prior level of function.    Clinical Impairments Affecting Rehab Potential (+) highly motivated (-) age and bilateral pain along plantar aspect of the foot   PT Frequency 2x / week   PT Duration 6 weeks   PT Treatment/Interventions Aquatic Therapy;ADLs/Self Care Home Management;Cryotherapy;Electrical Stimulation;Iontophoresis 4mg /ml Dexamethasone;Moist Heat;Traction;Balance training;Therapeutic exercise;Therapeutic activities;Gait training;Stair training;Functional mobility training;Neuromuscular re-education;Patient/family education;Passive range of motion;Manual techniques;Dry needling   PT Next Visit Plan Progress strengthening, improving ankle coordination   PT Home Exercise Plan Towel scrunches, resisted plantarflexion with GTB   Consulted and Agree with Plan of Care Patient      Patient will benefit from skilled therapeutic intervention in order to improve the following deficits and impairments:  Pain, Impaired sensation, Increased fascial restricitons, Decreased coordination, Decreased mobility, Hypermobility, Increased muscle spasms, Postural dysfunction, Decreased range of motion, Decreased endurance, Decreased activity tolerance, Decreased strength, Decreased balance, Difficulty walking  Visit Diagnosis: Pain in right foot  Chronic left-sided low back pain, with sciatica presence unspecified  Pain in left foot     Problem List Patient Active Problem List   Diagnosis Date Noted  . Avitaminosis D 06/07/2015  . Back ache 11/22/2013  . Artery disease, cerebral 10/26/2013  . Diverticulitis of colon 10/26/2013  . Acid reflux 10/26/2013  . Calcium blood increased 10/26/2013  . HLD (hyperlipidemia) 10/26/2013    Blythe Stanford, PT DPT 02/03/2017, 2:06 PM  [place] [facility] [address], [place], [address] Phone: [phone]   Fax:   [phone]  Name: MARAKI MACQUARRIE MRN: [id number] Date of Birth: 04-21-1928

## 2017-02-05 ENCOUNTER — Ambulatory Visit: Payer: Medicare Other | Attending: Physical Medicine and Rehabilitation

## 2017-02-05 DIAGNOSIS — G8929 Other chronic pain: Secondary | ICD-10-CM | POA: Insufficient documentation

## 2017-02-05 DIAGNOSIS — M545 Low back pain: Secondary | ICD-10-CM | POA: Diagnosis present

## 2017-02-05 DIAGNOSIS — M79671 Pain in right foot: Secondary | ICD-10-CM | POA: Diagnosis not present

## 2017-02-05 DIAGNOSIS — M79672 Pain in left foot: Secondary | ICD-10-CM | POA: Insufficient documentation

## 2017-02-05 NOTE — Therapy (Signed)
[place] [facility] [address], [place], [address] Phone: [phone]   Fax:  [phone]  Physical Therapy Treatment  Patient Details  Name: Jenny [name] MRN: [id number] Date of Birth: 12-Oct-1927 Referring Provider: Sharlet Salina  Encounter Date: 02/05/2017      PT End of Session - 02/05/17 1711    Visit Number 10   Number of Visits 12   Date for PT Re-Evaluation 02/26/17   Authorization Type 10/ 10 G Code   PT Start Time 8250   PT Stop Time 1615   PT Time Calculation (min) 45 min   Activity Tolerance Patient tolerated treatment well   Behavior During Therapy [facility] for tasks assessed/performed      Past Medical History:  Diagnosis Date  . HBP (high blood pressure)   . Reflux   . Skin cancer     Past Surgical History:  Procedure Laterality Date  . BREAST BIOPSY Right   . ESOPHAGOGASTRODUODENOSCOPY (EGD) WITH PROPOFOL N/A 04/26/2015   Procedure: ESOPHAGOGASTRODUODENOSCOPY (EGD) WITH PROPOFOL;  Surgeon: Manya Silvas, MD;  Location: [facility] ENDOSCOPY;  Service: Endoscopy;  Laterality: N/A;  . NECK SURGERY      There were no vitals filed for this visit.      Subjective Assessment - 02/05/17 1633    Subjective Patient reports no decrease in pain after the previous session. Patient reports her feet continue to hurt when walking.   Pertinent History Patient denies PMH; Reports pain in both feet and the back have worsened since the beginning of this year.    Limitations Lifting;Walking;Standing   How long can you stand comfortably? 66min   Diagnostic tests X -Ray   Currently in Pain? No/denies   Pain Onset More than a month ago         TREATMENT  TREATMENT: Therapeutic Exercise Heel raises-  x 20 B with intermittent UE support Heel raises and marching - x20 Heel dorsiflexion marching - x20 B Heel Raises and lowering with weight shifting onto one - 2 x 20 B    Manual therapy: STM to the  bottom of B feet along the plantarfascia and extensors of the feet to decrease increase pain and spasms. Active Release techniques performed along both her feet along flexor digitorum mini.     Patient demonstrates no increase in pain at end of session          PT Education - 02/05/17 1710    Education provided Yes   Education Details Form/technique with exercise   Person(s) Educated Patient   Methods Explanation;Demonstration   Comprehension Verbalized understanding;Returned demonstration             PT Long Term Goals - 01/29/17 1719      PT LONG TERM GOAL #1   Title Patient will be independent with HEP to continue benefits of therapy after discharge.   Baseline Depenedent with exercise technique and progression; Requires frequent cues for correct exercise performance   Time 6   Period Weeks   Status On-going     PT LONG TERM GOAL #2   Title Patient will improve MODI to under 10% to demonstrate improved ability to stand without pain.   Baseline MODI: 46%; 01/30/2016: 40%   Time 6   Period Weeks   Status On-going     PT LONG TERM GOAL #3   Title Patient will be able to walk without onset of pain to allow for patient to perform ADLs  with greater comfort.    Baseline onset of pain as soon as she stands; 01/29/2017: increased pain as she stands but more in feet than lumbar spine   Time 6   Period Weeks   Status On-going               Plan - 02/05/17 1713    Clinical Impression Statement Patient demonstrates decreased weight bearing on the  L LE when performing heel raises indicating poor arch support and intrinsic foot strength. Patient continues to demonstrate increased pain when walking but is able to perform toe walking in standing with less pain compared to previous visits and patient will benefit from further skilled therapy to return to prior level of function.    Clinical Impairments Affecting Rehab Potential (+) highly motivated (-) age and bilateral pain  along plantar aspect of the foot   PT Frequency 2x / week   PT Duration 6 weeks   PT Treatment/Interventions Aquatic Therapy;ADLs/Self Care Home Management;Cryotherapy;Electrical Stimulation;Iontophoresis 4mg /ml Dexamethasone;Moist Heat;Traction;Balance training;Therapeutic exercise;Therapeutic activities;Gait training;Stair training;Functional mobility training;Neuromuscular re-education;Patient/family education;Passive range of motion;Manual techniques;Dry needling   PT Next Visit Plan Progress strengthening, improving ankle coordination   PT Home Exercise Plan Towel scrunches, resisted plantarflexion with GTB   Consulted and Agree with Plan of Care Patient      Patient will benefit from skilled therapeutic intervention in order to improve the following deficits and impairments:  Pain, Impaired sensation, Increased fascial restricitons, Decreased coordination, Decreased mobility, Hypermobility, Increased muscle spasms, Postural dysfunction, Decreased range of motion, Decreased endurance, Decreased activity tolerance, Decreased strength, Decreased balance, Difficulty walking  Visit Diagnosis: Pain in right foot  Chronic left-sided low back pain, with sciatica presence unspecified  Pain in left foot     Problem List Patient Active Problem List   Diagnosis Date Noted  . Avitaminosis D 06/07/2015  . Back ache 11/22/2013  . Artery disease, cerebral 10/26/2013  . Diverticulitis of colon 10/26/2013  . Acid reflux 10/26/2013  . Calcium blood increased 10/26/2013  . HLD (hyperlipidemia) 10/26/2013    Blythe Stanford, PT DPT 02/05/2017, 5:25 PM  [place] [facility] [address], [place], [address] Phone: [phone]   Fax:  [phone]  Name: Jenny [name] MRN: [id number] Date of Birth: [birth year]

## 2017-02-09 ENCOUNTER — Ambulatory Visit: Payer: Medicare Other

## 2017-02-09 DIAGNOSIS — M79672 Pain in left foot: Secondary | ICD-10-CM

## 2017-02-09 DIAGNOSIS — G8929 Other chronic pain: Secondary | ICD-10-CM

## 2017-02-09 DIAGNOSIS — M545 Low back pain: Secondary | ICD-10-CM

## 2017-02-09 DIAGNOSIS — M79671 Pain in right foot: Secondary | ICD-10-CM | POA: Diagnosis not present

## 2017-02-09 NOTE — Therapy (Signed)
[facility] [address] [place], [address] Phone: [phone]   Fax:  [phone]  Physical Therapy Treatment  Patient Details  Name: Jenny [name] MRN: [id number] Date of Birth: 1928-03-30 Referring Provider: Sharlet Salina  Encounter Date: 02/09/2017      PT End of Session - 02/09/17 1249    Visit Number 11   Number of Visits 24   Date for PT Re-Evaluation 02/26/17   Authorization Type 1/ 10 G Code   PT Start Time 1030   PT Stop Time 1119   PT Time Calculation (min) 49 min   Activity Tolerance Patient tolerated treatment well   Behavior During Therapy [facility] for tasks assessed/performed      Past Medical History:  Diagnosis Date  . HBP (high blood pressure)   . Reflux   . Skin cancer     Past Surgical History:  Procedure Laterality Date  . BREAST BIOPSY Right   . ESOPHAGOGASTRODUODENOSCOPY (EGD) WITH PROPOFOL N/A 04/26/2015   Procedure: ESOPHAGOGASTRODUODENOSCOPY (EGD) WITH PROPOFOL;  Surgeon: Manya Silvas, MD;  Location: [facility] ENDOSCOPY;  Service: Endoscopy;  Laterality: N/A;  . NECK SURGERY      There were no vitals filed for this visit.      Subjective Assessment - 02/09/17 1247    Subjective Patient continues to demonstrate no improvement in symptoms and states "My feet hurt when I walk".    Pertinent History Patient denies PMH; Reports pain in both feet and the back have worsened since the beginning of this year.    Limitations Lifting;Walking;Standing   How long can you stand comfortably? 64min   Diagnostic tests X -Ray   Currently in Pain? No/denies   Pain Onset More than a month ago        Manual therapy: STM to the bottom of B feet along the plantarfascia and extensors of the feet to decrease increase pain and spasms. Active Release techniques performed along both her feet along flexor digitorum mini.   Ultrasound: 2.0 W/cm2 along both plantar aspect of the feet for  7 min each. 1.5" Transducer head used with 1Hz  to help decrease inflammation and pain along the plantar aspect of the foot with patient positioned in long sitting   Patient demonstrates decreased pain and spasms along the plantar aspect of the foot after therapy         PT Education - 02/09/17 1249    Education provided Yes   Education Details form/technique with exercise   Person(s) Educated Patient   Methods Explanation;Demonstration   Comprehension Verbalized understanding;Returned demonstration             PT Long Term Goals - 01/29/17 1719      PT LONG TERM GOAL #1   Title Patient will be independent with HEP to continue benefits of therapy after discharge.   Baseline Depenedent with exercise technique and progression; Requires frequent cues for correct exercise performance   Time 6   Period Weeks   Status On-going     PT LONG TERM GOAL #2   Title Patient will improve MODI to under 10% to demonstrate improved ability to stand without pain.   Baseline MODI: 46%; 01/30/2016: 40%   Time 6   Period Weeks   Status On-going     PT LONG TERM GOAL #3   Title Patient will be able to walk without onset of pain to allow for patient to perform ADLs with greater comfort.  Baseline onset of pain as soon as she stands; 01/29/2017: increased pain as she stands but more in feet than lumbar spine   Time 6   Period Weeks   Status On-going               Plan - 02/09/17 1250    Clinical Impression Statement Patient demonstrates decreased pain after performing manual therapy and [place] to plantar aspect of patient's feet. Patient continues to demonstrate increased pain along plantar aspect of her feet with little to no improvement noted throughout sessions. Will continue to monitor symptoms over future sessions. Patient will benefit from further skilled therapy focused on improving foot pain and instrinsic foot musculature.     Clinical Impairments Affecting Rehab Potential (+)  highly motivated (-) age and bilateral pain along plantar aspect of the foot   PT Frequency 2x / week   PT Duration 6 weeks   PT Treatment/Interventions Aquatic Therapy;ADLs/Self Care Home Management;Cryotherapy;Electrical Stimulation;Iontophoresis 4mg /ml Dexamethasone;Moist Heat;Traction;Balance training;Therapeutic exercise;Therapeutic activities;Gait training;Stair training;Functional mobility training;Neuromuscular re-education;Patient/family education;Passive range of motion;Manual techniques;Dry needling   PT Next Visit Plan Progress strengthening, improving ankle coordination   PT Home Exercise Plan Towel scrunches, resisted plantarflexion with GTB   Consulted and Agree with Plan of Care Patient      Patient will benefit from skilled therapeutic intervention in order to improve the following deficits and impairments:  Pain, Impaired sensation, Increased fascial restricitons, Decreased coordination, Decreased mobility, Hypermobility, Increased muscle spasms, Postural dysfunction, Decreased range of motion, Decreased endurance, Decreased activity tolerance, Decreased strength, Decreased balance, Difficulty walking  Visit Diagnosis: Pain in right foot  Chronic left-sided low back pain, with sciatica presence unspecified  Pain in left foot     Problem List Patient Active Problem List   Diagnosis Date Noted  . Avitaminosis D 06/07/2015  . Back ache 11/22/2013  . Artery disease, cerebral 10/26/2013  . Diverticulitis of colon 10/26/2013  . Acid reflux 10/26/2013  . Calcium blood increased 10/26/2013  . HLD (hyperlipidemia) 10/26/2013    Blythe Stanford, PT DPT 02/09/2017, 1:00 PM  [facility] [address], [place], [address] Phone: [phone]   Fax:  [phone]  Name: Jenny [name] MRN: [id number] Date of Birth: Dec 01, 1927

## 2017-02-12 ENCOUNTER — Ambulatory Visit: Payer: Medicare Other

## 2017-02-17 ENCOUNTER — Ambulatory Visit: Payer: Medicare Other

## 2017-02-17 DIAGNOSIS — M79671 Pain in right foot: Secondary | ICD-10-CM | POA: Diagnosis not present

## 2017-02-17 DIAGNOSIS — M545 Low back pain: Secondary | ICD-10-CM

## 2017-02-17 DIAGNOSIS — M79672 Pain in left foot: Secondary | ICD-10-CM

## 2017-02-17 DIAGNOSIS — G8929 Other chronic pain: Secondary | ICD-10-CM

## 2017-02-17 NOTE — Therapy (Signed)
[place] [address], [place], [address] Phone: [phone]   Fax:  [phone]  Physical Therapy Treatment  Patient Details  Name: Jenny [name] MRN: [id number] Date of Birth: 1928-06-26 Referring Provider: Sharlet Salina  Encounter Date: 02/17/2017      PT End of Session - 02/17/17 1447    Visit Number 12   Number of Visits 24   Date for PT Re-Evaluation 02/26/17   Authorization Type 2/ 10 G Code   PT Start Time 1115   PT Stop Time 1200   PT Time Calculation (min) 45 min   Activity Tolerance Patient tolerated treatment well   Behavior During Therapy [facility] for tasks assessed/performed      Past Medical History:  Diagnosis Date  . HBP (high blood pressure)   . Reflux   . Skin cancer     Past Surgical History:  Procedure Laterality Date  . BREAST BIOPSY Right   . ESOPHAGOGASTRODUODENOSCOPY (EGD) WITH PROPOFOL N/A 04/26/2015   Procedure: ESOPHAGOGASTRODUODENOSCOPY (EGD) WITH PROPOFOL;  Surgeon: Manya Silvas, MD;  Location: [facility] ENDOSCOPY;  Service: Endoscopy;  Laterality: N/A;  . NECK SURGERY      There were no vitals filed for this visit.      Subjective Assessment - 02/17/17 1445    Subjective Patient reports she feels improvement of symptoms after performing manual therapy but continues to reports increased pain when she's walking.    Pertinent History Patient denies PMH; Reports pain in both feet and the back have worsened since the beginning of this year.    Limitations Lifting;Walking;Standing   How long can you stand comfortably? 58min   Diagnostic tests X -Ray   Currently in Pain? No/denies   Pain Onset More than a month ago      TREATMENT  Therapeutic Exercise: Toe flexion Isometrics - 10sec holds x 10 Hip abduction in standing at machine - x 20 55# Heel raises off of 6" step with shoes on - x20  Toe walking at side of stairs with UE support - x20  Manual  therapy: Performed STM to patients B feet along plantar fascia, interosseous musculature, and toe flexor muscles. Patient's positioned in long sitting while this was performed and the goal's to decrease increased muscular spasms and pain.  Toe Digits Mobilizations: 1-5th digits A-P grade II/III to MTP, DIP, PIP to all joints of the toes - x 30sec for each joint to improve capsular mobility.  Patient demonstrates decreased pain at the end of session.        PT Education - 02/17/17 1446    Education provided Yes   Education Details form/technique with exercise   Person(s) Educated Patient   Methods Explanation;Demonstration   Comprehension Verbalized understanding;Returned demonstration             PT Long Term Goals - 01/29/17 1719      PT LONG TERM GOAL #1   Title Patient will be independent with HEP to continue benefits of therapy after discharge.   Baseline Depenedent with exercise technique and progression; Requires frequent cues for correct exercise performance   Time 6   Period Weeks   Status On-going     PT LONG TERM GOAL #2   Title Patient will improve MODI to under 10% to demonstrate improved ability to stand without pain.   Baseline MODI: 46%; 01/30/2016: 40%   Time 6   Period Weeks   Status On-going  PT LONG TERM GOAL #3   Title Patient will be able to walk without onset of pain to allow for patient to perform ADLs with greater comfort.    Baseline onset of pain as soon as she stands; 01/29/2017: increased pain as she stands but more in feet than lumbar spine   Time 6   Period Weeks   Status On-going               Plan - 02/17/17 1448    Clinical Impression Statement Patient demonstrates improvement in symptoms after performing manual therapy to the bottom aspect of her feet. Patient demonstrates increased pain and soft tissue spasms/trigger points along the interosseus musculature along bilateral feet. Patient demonstrates improved toe flexion with  movement (most improved along the PIP and MTP). Patient will benefit from further skilled therapy focused on improving toe flexion and patient will benefit from further skilled therapy to return to prior level of function.    Clinical Impairments Affecting Rehab Potential (+) highly motivated (-) age and bilateral pain along plantar aspect of the foot   PT Frequency 2x / week   PT Duration 6 weeks   PT Treatment/Interventions Aquatic Therapy;ADLs/Self Care Home Management;Cryotherapy;Electrical Stimulation;Iontophoresis 4mg /ml Dexamethasone;Moist Heat;Traction;Balance training;Therapeutic exercise;Therapeutic activities;Gait training;Stair training;Functional mobility training;Neuromuscular re-education;Patient/family education;Passive range of motion;Manual techniques;Dry needling   PT Next Visit Plan Progress strengthening, improving ankle coordination   PT Home Exercise Plan Towel scrunches, resisted plantarflexion with GTB   Consulted and Agree with Plan of Care Patient      Patient will benefit from skilled therapeutic intervention in order to improve the following deficits and impairments:  Pain, Impaired sensation, Increased fascial restricitons, Decreased coordination, Decreased mobility, Hypermobility, Increased muscle spasms, Postural dysfunction, Decreased range of motion, Decreased endurance, Decreased activity tolerance, Decreased strength, Decreased balance, Difficulty walking  Visit Diagnosis: Pain in right foot  Chronic left-sided low back pain, with sciatica presence unspecified  Pain in left foot     Problem List Patient Active Problem List   Diagnosis Date Noted  . Avitaminosis D 06/07/2015  . Back ache 11/22/2013  . Artery disease, cerebral 10/26/2013  . Diverticulitis of colon 10/26/2013  . Acid reflux 10/26/2013  . Calcium blood increased 10/26/2013  . HLD (hyperlipidemia) 10/26/2013    Blythe Stanford, PT DPT 02/17/2017, 3:02 PM  [facility] [address] [place], [address] Phone: [phone]   Fax:  [phone]  Name: Jenny [name] MRN: [id number] Date of Birth: 02-06-1928

## 2017-02-24 ENCOUNTER — Ambulatory Visit: Payer: Medicare Other

## 2017-02-24 DIAGNOSIS — M79671 Pain in right foot: Secondary | ICD-10-CM

## 2017-02-24 DIAGNOSIS — G8929 Other chronic pain: Secondary | ICD-10-CM

## 2017-02-24 DIAGNOSIS — M545 Low back pain: Secondary | ICD-10-CM

## 2017-02-24 DIAGNOSIS — M79672 Pain in left foot: Secondary | ICD-10-CM

## 2017-02-24 NOTE — Therapy (Signed)
[place] [facility] [address] [place], [address] Phone: [phone]   Fax:  [phone]  Physical Therapy Treatment  Patient Details  Name: Jenny [name] MRN: [id number] Date of Birth: 1928/01/30 Referring Provider: Sharlet Salina  Encounter Date: 02/24/2017      PT End of Session - 02/24/17 1353    Visit Number 13   Number of Visits 24   Date for PT Re-Evaluation 02/26/17   Authorization Type 3/ 10 G Code   PT Start Time 1300   PT Stop Time 1345   PT Time Calculation (min) 45 min   Activity Tolerance Patient tolerated treatment well   Behavior During Therapy [facility] for tasks assessed/performed      Past Medical History:  Diagnosis Date  . HBP (high blood pressure)   . Reflux   . Skin cancer     Past Surgical History:  Procedure Laterality Date  . BREAST BIOPSY Right   . ESOPHAGOGASTRODUODENOSCOPY (EGD) WITH PROPOFOL N/A 04/26/2015   Procedure: ESOPHAGOGASTRODUODENOSCOPY (EGD) WITH PROPOFOL;  Surgeon: Manya Silvas, MD;  Location: [facility] ENDOSCOPY;  Service: Endoscopy;  Laterality: N/A;  . NECK SURGERY      There were no vitals filed for this visit.      Subjective Assessment - 02/24/17 1351    Subjective Patient reports she continues to have pain. Patient reports she feels better the day of therapy but as soon as she wakes up the following morning, the pain returns. Patient reports she's having a lumbar injection on the upcoming Friday and is hopeful about the pain improving from that procedure. Patient states she's felt increased fatigue over the past 2 years and feels tired overall. Patient reports her lumbar has been improving since starting therapy.     Pertinent History Patient denies PMH; Reports pain in both feet and the back have worsened since the beginning of this year.    Limitations Lifting;Walking;Standing   How long can you stand comfortably? 63min   Diagnostic tests X -Ray   Currently in Pain? No/denies   Pain Onset More than a month ago        TREATMENT   Therapeutic Exercise: Toe flexion Isometrics - 10sec holds x 10 Lumbar/hip extension in standing - x 30  Heel raises with shoes off - x20    Manual therapy: Performed STM to patients B feet along plantar fascia, interosseous musculature, and toe flexor muscles. Patient's positioned in long sitting while this was performed and the goal's to decrease increased muscular spasms and pain.  Toe Digits Mobilizations: 1-5th digits A-P grade II/III to MTP, DIP, PIP to all joints of the toes - 2 x 30sec for each joint to improve capsular mobility.   Patient demonstrates decreased pain at the end of session.       PT Education - 02/24/17 1353    Education provided Yes   Education Details form/technique with exercise   Person(s) Educated Patient   Methods Explanation;Demonstration   Comprehension Verbalized understanding;Returned demonstration             PT Long Term Goals - 01/29/17 1719      PT LONG TERM GOAL #1   Title Patient will be independent with HEP to continue benefits of therapy after discharge.   Baseline Depenedent with exercise technique and progression; Requires frequent cues for correct exercise performance   Time 6   Period Weeks   Status On-going     PT LONG TERM  GOAL #2   Title Patient will improve MODI to under 10% to demonstrate improved ability to stand without pain.   Baseline MODI: 46%; 01/30/2016: 40%   Time 6   Period Weeks   Status On-going     PT LONG TERM GOAL #3   Title Patient will be able to walk without onset of pain to allow for patient to perform ADLs with greater comfort.    Baseline onset of pain as soon as she stands; 01/29/2017: increased pain as she stands but more in feet than lumbar spine   Time 6   Period Weeks   Status On-going               Plan - 02/24/17 1353    Clinical Impression Statement Patient conitnues to experience B pain  along the dorsal and plantar aspect of bilateral feet. Patient demonstrates increased pain along the interosseous musculature along B feet, the plantar fascia/flexor digiti minimi, and MTP joints bilaterally across all toe digits. Patient responds well to STM to the area and decreased pain after performing soft tissue mobilization and joint mobilizations. However, patient demonstrates poor functional carrryover between visits as indicated by continued pain along B feet. Patient reports she has not been performing HEP and educated patient today on importance with HEP compliance and improving muscular strength /endurance along the plantar aspect of her feet. Patient is able to perform heel raises bilaterally without wearing shoes indicating overall improvement, but continues to demonstrate significant change in pain. Patient will benefit from further skilled therapy focused on improving the aforementioned limitations to return to prior level of function.    Clinical Impairments Affecting Rehab Potential (+) highly motivated (-) age and bilateral pain along plantar aspect of the foot   PT Frequency 2x / week   PT Duration 6 weeks   PT Treatment/Interventions Aquatic Therapy;ADLs/Self Care Home Management;Cryotherapy;Electrical Stimulation;Iontophoresis 4mg /ml Dexamethasone;Moist Heat;Traction;Balance training;Therapeutic exercise;Therapeutic activities;Gait training;Stair training;Functional mobility training;Neuromuscular re-education;Patient/family education;Passive range of motion;Manual techniques;Dry needling   PT Next Visit Plan Progress strengthening, improving ankle coordination   PT Home Exercise Plan Towel scrunches, resisted plantarflexion with GTB   Consulted and Agree with Plan of Care Patient      Patient will benefit from skilled therapeutic intervention in order to improve the following deficits and impairments:  Pain, Impaired sensation, Increased fascial restricitons, Decreased  coordination, Decreased mobility, Hypermobility, Increased muscle spasms, Postural dysfunction, Decreased range of motion, Decreased endurance, Decreased activity tolerance, Decreased strength, Decreased balance, Difficulty walking  Visit Diagnosis: Pain in right foot  Chronic left-sided low back pain, with sciatica presence unspecified  Pain in left foot     Problem List Patient Active Problem List   Diagnosis Date Noted  . Avitaminosis D 06/07/2015  . Back ache 11/22/2013  . Artery disease, cerebral 10/26/2013  . Diverticulitis of colon 10/26/2013  . Acid reflux 10/26/2013  . Calcium blood increased 10/26/2013  . HLD (hyperlipidemia) 10/26/2013    Blythe Stanford, PT DPT 02/24/2017, 2:01 PM  [facility] [address] [place], [address] Phone: [phone]   Fax:  [phone]  Name: Jenny [name] MRN: [id number] Date of Birth: 09-02-1927

## 2017-02-25 ENCOUNTER — Ambulatory Visit: Payer: Medicare Other

## 2017-02-25 DIAGNOSIS — M79672 Pain in left foot: Secondary | ICD-10-CM

## 2017-02-25 DIAGNOSIS — G8929 Other chronic pain: Secondary | ICD-10-CM

## 2017-02-25 DIAGNOSIS — M79671 Pain in right foot: Secondary | ICD-10-CM

## 2017-02-25 DIAGNOSIS — M545 Low back pain: Secondary | ICD-10-CM

## 2017-02-25 NOTE — Therapy (Signed)
[facility] [address], [place], [address] Phone: [phone]   Fax:  [phone]  Physical Therapy Treatment  Patient Details  Name: Jenny [name] MRN: [id number] Date of Birth: 12-Jul-1927 Referring Provider: Sharlet Salina  Encounter Date: 02/25/2017      PT End of Session - 02/25/17 1417    Visit Number 14   Number of Visits 24   Date for PT Re-Evaluation 02/26/17   Authorization Type 4/ 10 G Code   PT Start Time 1330   PT Stop Time 1415   PT Time Calculation (min) 45 min   Activity Tolerance Patient tolerated treatment well   Behavior During Therapy [facility] for tasks assessed/performed      Past Medical History:  Diagnosis Date  . HBP (high blood pressure)   . Reflux   . Skin cancer     Past Surgical History:  Procedure Laterality Date  . BREAST BIOPSY Right   . ESOPHAGOGASTRODUODENOSCOPY (EGD) WITH PROPOFOL N/A 04/26/2015   Procedure: ESOPHAGOGASTRODUODENOSCOPY (EGD) WITH PROPOFOL;  Surgeon: Manya Silvas, MD;  Location: [facility] ENDOSCOPY;  Service: Endoscopy;  Laterality: N/A;  . NECK SURGERY      There were no vitals filed for this visit.      Subjective Assessment - 02/25/17 1403    Subjective Patient reports the foot pain is slightly improved since the previous treatment yesterday. Patient reports her back is slightly aggravated today   Pertinent History Patient denies PMH; Reports pain in both feet and the back have worsened since the beginning of this year.    Limitations Lifting;Walking;Standing   How long can you stand comfortably? 29min   Diagnostic tests X -Ray   Currently in Pain? No/denies   Pain Onset More than a month ago       Therapeutic Exercise: Toe flexion Isometrics - 10sec holds x 10 B Multifidus crunch in sitting - x 30  Heel raises with shoes off - x30 Single leg stance on airex pad with towel on it -- 30sec x 3  Manual therapy: Performed STM to  patients B feet along plantar fascia, interosseous musculature, and toe flexor muscles. Patient's positioned in long sitting while this was performed and the goal's to decrease increased muscular spasms and pain.  Toe Digits Mobilizations: 1-5thdigits A-P grade II/III to MTP, DIP, PIP to all joints of the toes - 2 x 30sec for each joint to improve capsular mobility.  Patient demonstrates decreased pain at the end of session.      PT Education - 02/25/17 1417    Education Details form/technique with exercise   Person(s) Educated Patient   Methods Explanation;Demonstration   Comprehension Verbalized understanding;Returned demonstration             PT Long Term Goals - 01/29/17 1719      PT LONG TERM GOAL #1   Title Patient will be independent with HEP to continue benefits of therapy after discharge.   Baseline Depenedent with exercise technique and progression; Requires frequent cues for correct exercise performance   Time 6   Period Weeks   Status On-going     PT LONG TERM GOAL #2   Title Patient will improve MODI to under 10% to demonstrate improved ability to stand without pain.   Baseline MODI: 46%; 01/30/2016: 40%   Time 6   Period Weeks   Status On-going     PT LONG TERM GOAL #3   Title Patient will be  able to walk without onset of pain to allow for patient to perform ADLs with greater comfort.    Baseline onset of pain as soon as she stands; 01/29/2017: increased pain as she stands but more in feet than lumbar spine   Time 6   Period Weeks   Status On-going               Plan - 02/25/17 1418    Clinical Impression Statement Continued to perform STM to the bottom of the foot to decrease pain along the plantar fascia and interosseus musculature to decrease increased pain and spasms throughout the session. Patient demonstrates improvement in symptoms after performing isometrics and STM. Educated patient on performing ambulation with increased heel strike and  patient will benefit from further skilled therapy focused on improving limitations to return to prior level of function.    Clinical Impairments Affecting Rehab Potential (+) highly motivated (-) age and bilateral pain along plantar aspect of the foot   PT Frequency 2x / week   PT Duration 6 weeks   PT Treatment/Interventions Aquatic Therapy;ADLs/Self Care Home Management;Cryotherapy;Electrical Stimulation;Iontophoresis 4mg /ml Dexamethasone;Moist Heat;Traction;Balance training;Therapeutic exercise;Therapeutic activities;Gait training;Stair training;Functional mobility training;Neuromuscular re-education;Patient/family education;Passive range of motion;Manual techniques;Dry needling   PT Next Visit Plan Progress strengthening, improving ankle coordination   PT Home Exercise Plan Towel scrunches, resisted plantarflexion with GTB   Consulted and Agree with Plan of Care Patient      Patient will benefit from skilled therapeutic intervention in order to improve the following deficits and impairments:  Pain, Impaired sensation, Increased fascial restricitons, Decreased coordination, Decreased mobility, Hypermobility, Increased muscle spasms, Postural dysfunction, Decreased range of motion, Decreased endurance, Decreased activity tolerance, Decreased strength, Decreased balance, Difficulty walking  Visit Diagnosis: Pain in right foot  Chronic left-sided low back pain, with sciatica presence unspecified  Pain in left foot     Problem List Patient Active Problem List   Diagnosis Date Noted  . Avitaminosis D 06/07/2015  . Back ache 11/22/2013  . Artery disease, cerebral 10/26/2013  . Diverticulitis of colon 10/26/2013  . Acid reflux 10/26/2013  . Calcium blood increased 10/26/2013  . HLD (hyperlipidemia) 10/26/2013    Blythe Stanford, PT DPT 02/25/2017, 2:21 PM  [facility] [place] [facility] [address] [place], [address] Phone:  [phone]   Fax:  [phone]  Name: ANNASTASIA HASKINS MRN: [id number] Date of Birth: 09/25/1927

## 2017-03-03 ENCOUNTER — Ambulatory Visit: Payer: Medicare Other

## 2017-03-03 DIAGNOSIS — M79671 Pain in right foot: Secondary | ICD-10-CM

## 2017-03-03 DIAGNOSIS — M79672 Pain in left foot: Secondary | ICD-10-CM

## 2017-03-03 DIAGNOSIS — M545 Low back pain: Secondary | ICD-10-CM

## 2017-03-03 DIAGNOSIS — G8929 Other chronic pain: Secondary | ICD-10-CM

## 2017-03-03 NOTE — Therapy (Signed)
[facility] [address], [place], [address] Phone: [phone]   Fax:  [phone]  Physical Therapy Treatment  Patient Details  Name: Jenny [name] MRN: [id number] Date of Birth: [birth year] Referring Provider: Sharlet Salina  Encounter Date: 03/03/2017      PT End of Session - 03/03/17 1359    Visit Number 15   Number of Visits 32   Date for PT Re-Evaluation 03/31/17   Authorization Type 5/ 10 G Code   PT Start Time 1302   PT Stop Time 1345   PT Time Calculation (min) 43 min   Activity Tolerance Patient tolerated treatment well   Behavior During Therapy [facility] for tasks assessed/performed      Past Medical History:  Diagnosis Date  . HBP (high blood pressure)   . Reflux   . Skin cancer     Past Surgical History:  Procedure Laterality Date  . BREAST BIOPSY Right   . ESOPHAGOGASTRODUODENOSCOPY (EGD) WITH PROPOFOL N/A 04/26/2015   Procedure: ESOPHAGOGASTRODUODENOSCOPY (EGD) WITH PROPOFOL;  Surgeon: Manya Silvas, MD;  [facility] END[facility]COPY;  Service: Endoscopy;  Laterality: N/A;  . NECK SURGERY      There were no vitals filed for this visit.      Subjective Assessment - 03/03/17 1306    Subjective Patient reports she had two lumbar injections which seemed to help with the pain. Patient reports she continues to have increased pain   Pertinent History Patient denies PMH; Reports pain in both feet and the back have worsened since the beginning of this year.    Limitations Lifting;Walking;Standing   How long can you stand comfortably? 34min   Diagnostic tests X -Ray   Currently in Pain? No/denies   Pain Onset More than a month ago      TREATMENT Manual therapy:  Grade II-III P-A mobilizations to the L3-S1 along the L transverse process and centrally - 3 x 30sec for each segment to improve mobility and decrease pain. Therapeutic Exercise: Widened tandem stance weight shifts  forward/backward - x20 Hip extension in standing - x 15 Hip abduction in standing - x 15  CKC hip/lumbar extension in standing - x 15 Feet together balance - head turns up/down; left/right - x20 on airex  Patient demonstrates decrease in pain after exercise performance        PT Education - 03/03/17 1327    Education provided Yes   Education Details form/technique with exercise   Person(s) Educated Patient   Methods Explanation;Demonstration   Comprehension Verbalized understanding;Returned demonstration             PT Long Term Goals - 03/03/17 1344      PT LONG TERM GOAL #1   Title Patient will be independent with HEP to continue benefits of therapy after discharge.   Baseline Depenedent with exercise technique and progression; Requires frequent cues for correct exercise performance; minimal cueing for exercise performance   Time 6   Period Weeks   Status On-going     PT LONG TERM GOAL #2   Title Patient will improve MODI to under 10% to demonstrate improved ability to stand without pain.   Baseline MODI: 46%; 01/29/2017: 40%; 03/03/2017: 36%   Time 6   Period Weeks   Status On-going     PT LONG TERM GOAL #3   Title Patient will be able to walk without onset of pain to allow for patient to perform ADLs with greater comfort.  Baseline onset of pain as soon as she stands; 01/29/2017: increased pain as she stands but more in feet than lumbar spine; 03/03/2017: Onset of pain with walking and weight shifting   Time 6   Period Weeks   Status On-going               Plan - 03/03/17 1426    Clinical Impression Statement Patient demonstrates decreased pain after performing manual therapy indicating improvement in spinal mobility and motor control. Patient demonstrates decreased balance and strength with exercises and will benefit from further skilled therapy to return to prior level of function.   Clinical Impairments Affecting Rehab Potential (+) highly motivated (-)  age and bilateral pain along plantar aspect of the foot   PT Frequency 2x / week   PT Duration 6 weeks   PT Treatment/Interventions Aquatic Therapy;ADLs/Self Care Home Management;Cryotherapy;Electrical Stimulation;Iontophoresis 4mg /ml Dexamethasone;Moist Heat;Traction;Balance training;Therapeutic exercise;Therapeutic activities;Gait training;Stair training;Functional mobility training;Neuromuscular re-education;Patient/family education;Passive range of motion;Manual techniques;Dry needling   PT Next Visit Plan Progress strengthening, improving ankle coordination   PT Home Exercise Plan Towel scrunches, resisted plantarflexion with GTB   Consulted and Agree with Plan of Care Patient      Patient will benefit from skilled therapeutic intervention in order to improve the following deficits and impairments:  Pain, Impaired sensation, Increased fascial restricitons, Decreased coordination, Decreased mobility, Hypermobility, Increased muscle spasms, Postural dysfunction, Decreased range of motion, Decreased endurance, Decreased activity tolerance, Decreased strength, Decreased balance, Difficulty walking  Visit Diagnosis: Pain in right foot - Plan: PT plan of care cert/re-cert  Chronic left-sided low back pain, with sciatica presence unspecified - Plan: PT plan of care cert/re-cert  Pain in left foot - Plan: PT plan of care cert/re-cert     Problem List Patient Active Problem List   Diagnosis Date Noted  . Avitaminosis D 06/07/2015  . Back ache 11/22/2013  . Artery disease, cerebral 10/26/2013  . Diverticulitis of colon 10/26/2013  . Acid reflux 10/26/2013  . Calcium blood increased 10/26/2013  . HLD (hyperlipidemia) 10/26/2013    Blythe Stanford, PT DPT 03/03/2017, 2:34 PM  [facility] [address] [place], [address] Phone: [phone]   Fax:  [phone]  Name: Jenny [name] MRN: [id number] Date of  Birth: 12-Dec-1927

## 2017-03-04 ENCOUNTER — Ambulatory Visit: Payer: Medicare Other

## 2017-03-11 ENCOUNTER — Ambulatory Visit: Payer: Medicare Other | Attending: Physical Medicine and Rehabilitation

## 2017-03-11 DIAGNOSIS — M79671 Pain in right foot: Secondary | ICD-10-CM | POA: Insufficient documentation

## 2017-03-11 DIAGNOSIS — M545 Low back pain: Secondary | ICD-10-CM | POA: Diagnosis not present

## 2017-03-11 DIAGNOSIS — M79672 Pain in left foot: Secondary | ICD-10-CM | POA: Diagnosis present

## 2017-03-11 DIAGNOSIS — G8929 Other chronic pain: Secondary | ICD-10-CM | POA: Insufficient documentation

## 2017-03-11 NOTE — Therapy (Signed)
[facility] [address], [place], [address] Phone: [phone]   Fax:  [phone]  Physical Therapy Treatment  Patient Details  Name: Jenny [name] MRN: [id number] Date of Birth: [birth year] Referring Provider: Sharlet Salina  Encounter Date: 03/11/2017      PT End of Session - 03/11/17 1044    Visit Number 16   Number of Visits 32   Date for PT Re-Evaluation 03/31/17   Authorization Type 6/ 10 G Code   PT Start Time 1000   PT Stop Time 1045   PT Time Calculation (min) 45 min   Activity Tolerance Patient tolerated treatment well   Behavior During Therapy [facility] for tasks assessed/performed      Past Medical History:  Diagnosis Date  . HBP (high blood pressure)   . Reflux   . Skin cancer     Past Surgical History:  Procedure Laterality Date  . BREAST BIOPSY Right   . ESOPHAGOGASTRODUODENOSCOPY (EGD) WITH PROPOFOL N/A 04/26/2015   Procedure: ESOPHAGOGASTRODUODENOSCOPY (EGD) WITH PROPOFOL;  Surgeon: Manya Silvas, MD;  Location[facility] ENDOSCOPY;  Service: Endoscopy;  Laterality: N/A;  . NECK SURGERY      There were no vitals filed for this visit.      Subjective Assessment - 03/11/17 1030    Subjective Patient reports her back has been feeling better after the previous session and having the injections. Patient states she would like to work on her balance.    Pertinent History Patient denies PMH; Reports pain in both feet and the back have worsened since the beginning of this year.    Limitations Lifting;Walking;Standing   How long can you stand comfortably? 21min   Diagnostic tests X -Ray   Currently in Pain? No/denies   Pain Onset More than a month ago        Therapeutic Exercise: Side stepping up and over airex pad - x 20 Standing marches on airex pad - x 15 B Standing feet together balance on airex pad - x 10; head turns up/down, left/right; EC off of airex - 2 x 30sec   Standing heel/toe lifts in standing - x 10 Heel raises off of step - x 20     Manual therapy: Performed STM to patients B feet along plantar fascia, interosseous musculature, and toe flexor muscles. Patient's positioned in long sitting while this was performed and the goal's to decrease increased muscular spasms and pain.    Patient demonstrates decreased pain at the end of session.         PT Education - 03/11/17 1043    Education provided Yes   Education Details form/technique with exercise   Person(s) Educated Patient   Methods Explanation;Demonstration   Comprehension Verbalized understanding;Returned demonstration             PT Long Term Goals - 03/03/17 1344      PT LONG TERM GOAL #1   Title Patient will be independent with HEP to continue benefits of therapy after discharge.   Baseline Depenedent with exercise technique and progression; Requires frequent cues for correct exercise performance; minimal cueing for exercise performance   Time 6   Period Weeks   Status On-going     PT LONG TERM GOAL #2   Title Patient will improve MODI to under 10% to demonstrate improved ability to stand without pain.   Baseline MODI: 46%; 01/29/2017: 40%; 03/03/2017: 36%   Time 6   Period Weeks  Status On-going     PT LONG TERM GOAL #3   Title Patient will be able to walk without onset of pain to allow for patient to perform ADLs with greater comfort.    Baseline onset of pain as soon as she stands; 01/29/2017: increased pain as she stands but more in feet than lumbar spine; 03/03/2017: Onset of pain with walking and weight shifting   Time 6   Period Weeks   Status On-going               Plan - 03/11/17 1044    Clinical Impression Statement Focused on improving balance today in standing and decreasing increased foot spasms. Patient demosntrates decreased muscular spasms and pain along B aspect of the feet indicating functional carryover between sessions. Patient will  benefit from further skilled therapy to return to prior level of function.    Clinical Impairments Affecting Rehab Potential (+) highly motivated (-) age and bilateral pain along plantar aspect of the foot   PT Frequency 2x / week   PT Duration 6 weeks   PT Treatment/Interventions Aquatic Therapy;ADLs/Self Care Home Management;Cryotherapy;Electrical Stimulation;Iontophoresis 4mg /ml Dexamethasone;Moist Heat;Traction;Balance training;Therapeutic exercise;Therapeutic activities;Gait training;Stair training;Functional mobility training;Neuromuscular re-education;Patient/family education;Passive range of motion;Manual techniques;Dry needling   PT Next Visit Plan Progress strengthening, improving ankle coordination   PT Home Exercise Plan Towel scrunches, resisted plantarflexion with GTB   Consulted and Agree with Plan of Care Patient      Patient will benefit from skilled therapeutic intervention in order to improve the following deficits and impairments:  Pain, Impaired sensation, Increased fascial restricitons, Decreased coordination, Decreased mobility, Hypermobility, Increased muscle spasms, Postural dysfunction, Decreased range of motion, Decreased endurance, Decreased activity tolerance, Decreased strength, Decreased balance, Difficulty walking  Visit Diagnosis: Chronic left-sided low back pain, with sciatica presence unspecified  Pain in right foot  Pain in left foot     Problem List Patient Active Problem List   Diagnosis Date Noted  . Avitaminosis D 06/07/2015  . Back ache 11/22/2013  . Artery disease, cerebral 10/26/2013  . Diverticulitis of colon 10/26/2013  . Acid reflux 10/26/2013  . Calcium blood increased 10/26/2013  . HLD (hyperlipidemia) 10/26/2013    Blythe Stanford, PT DPT 03/11/2017, 10:46 AM  [facility] [address] [place], [address] Phone: [phone]   Fax:  [phone]  Name:  Jenny [name] MRN: [id number] Date of Birth: [birth year]

## 2017-03-16 ENCOUNTER — Ambulatory Visit: Payer: Medicare Other

## 2017-03-18 ENCOUNTER — Ambulatory Visit: Payer: Medicare Other

## 2017-03-18 DIAGNOSIS — M545 Low back pain: Secondary | ICD-10-CM | POA: Diagnosis not present

## 2017-03-18 DIAGNOSIS — M79672 Pain in left foot: Secondary | ICD-10-CM

## 2017-03-18 DIAGNOSIS — M79671 Pain in right foot: Secondary | ICD-10-CM

## 2017-03-18 DIAGNOSIS — G8929 Other chronic pain: Secondary | ICD-10-CM

## 2017-03-18 NOTE — Therapy (Signed)
[facility] [address] [place], [address] Phone: [phone]   Fax:  [phone]  Physical Therapy Treatment  Patient Details  Name: Jenny [name] MRN: [id number] Date of Birth: Aug 02, 1927 Referring Provider: Sharlet Salina  Encounter Date: 03/18/2017      PT End of Session - 03/18/17 1551    Visit Number 17   Number of Visits 32   Date for PT Re-Evaluation 03/31/17   Authorization Type 7/ 10 G Code   PT Start Time 4818   PT Stop Time 1430   PT Time Calculation (min) 45 min   Activity Tolerance Patient tolerated treatment well   Behavior During Therapy [facility] for tasks assessed/performed      Past Medical History:  Diagnosis Date  . HBP (high blood pressure)   . Reflux   . Skin cancer     Past Surgical History:  Procedure Laterality Date  . BREAST BIOPSY Right   . ESOPHAGOGASTRODUODENOSCOPY (EGD) WITH PROPOFOL N/A 04/26/2015   Procedure: ESOPHAGOGASTRODUODENOSCOPY (EGD) WITH PROPOFOL;  Surgeon: Manya Silvas, MD;  Location: [facility] ENDOSCOPY;  Service: Endoscopy;  Laterality: N/A;  . NECK SURGERY      There were no vitals filed for this visit.      Subjective Assessment - 03/18/17 1550    Subjective Patient reports her pain has returned when she walks. Patient reports she continues to have difficulty and the injection "didn't really help".    Pertinent History Patient denies PMH; Reports pain in both feet and the back have worsened since the beginning of this year.    Limitations Lifting;Walking;Standing   How long can you stand comfortably? 76min   Diagnostic tests X -Ray   Currently in Pain? No/denies   Pain Onset More than a month ago      TREATMENT Manual therapy: Performed STM to patients B feet along plantar fascia, interosseous musculature, and toe flexor muscles. Patient's positioned in long sitting while this was performed and the goal's to decrease increased muscular  spasms and pain.  Therapeutic Exercise: Single leg stance with patient positioned on two steps - x 20 B  Marches with patient positioned on edge of step - x 20 B Eccentric lowering heel off of step single leg, B LE - x20 B Side stepping with RTB around knees - x26ft  Side stepping along 2 x 4 - side to side - down and back x5  Heel raises off of ground single leg, B LE - x30 with increased pain  Patient reports increased fatigue at the end of the session       PT Education - 03/18/17 1551    Education provided Yes   Education Details form/technique with exercise   Person(s) Educated Patient   Methods Explanation;Demonstration   Comprehension Verbalized understanding;Returned demonstration             PT Long Term Goals - 03/03/17 1344      PT LONG TERM GOAL #1   Title Patient will be independent with HEP to continue benefits of therapy after discharge.   Baseline Depenedent with exercise technique and progression; Requires frequent cues for correct exercise performance; minimal cueing for exercise performance   Time 6   Period Weeks   Status On-going     PT LONG TERM GOAL #2   Title Patient will improve MODI to under 10% to demonstrate improved ability to stand without pain.   Baseline MODI: 46%; 01/29/2017: 40%; 03/03/2017: 36%  Time 6   Period Weeks   Status On-going     PT LONG TERM GOAL #3   Title Patient will be able to walk without onset of pain to allow for patient to perform ADLs with greater comfort.    Baseline onset of pain as soon as she stands; 01/29/2017: increased pain as she stands but more in feet than lumbar spine; 03/03/2017: Onset of pain with walking and weight shifting   Time 6   Period Weeks   Status On-going               Plan - 03/18/17 1552    Clinical Impression Statement Challened musculature of the feet today with performing exercises today, having the patient perform exercises to improve arch support and strength; patient  demonstrates difficulty with self modulation of exercise unable to decrease intensity or stop exercise when she first starts to experience pain. Patient demonstrates improvement with heel raises exercise today versus previous sessions indicating functional carryover. Patient will benefit from further skilled therapy focused on improving limitations to return to prior level of function.    Clinical Impairments Affecting Rehab Potential (+) highly motivated (-) age and bilateral pain along plantar aspect of the foot   PT Frequency 2x / week   PT Duration 6 weeks   PT Treatment/Interventions Aquatic Therapy;ADLs/Self Care Home Management;Cryotherapy;Electrical Stimulation;Iontophoresis 4mg /ml Dexamethasone;Moist Heat;Traction;Balance training;Therapeutic exercise;Therapeutic activities;Gait training;Stair training;Functional mobility training;Neuromuscular re-education;Patient/family education;Passive range of motion;Manual techniques;Dry needling   PT Next Visit Plan Progress strengthening, improving ankle coordination   PT Home Exercise Plan Towel scrunches, resisted plantarflexion with GTB   Consulted and Agree with Plan of Care Patient      Patient will benefit from skilled therapeutic intervention in order to improve the following deficits and impairments:  Pain, Impaired sensation, Increased fascial restricitons, Decreased coordination, Decreased mobility, Hypermobility, Increased muscle spasms, Postural dysfunction, Decreased range of motion, Decreased endurance, Decreased activity tolerance, Decreased strength, Decreased balance, Difficulty walking  Visit Diagnosis: Chronic left-sided low back pain, with sciatica presence unspecified  Pain in right foot  Pain in left foot     Problem List Patient Active Problem List   Diagnosis Date Noted  . Avitaminosis D 06/07/2015  . Back ache 11/22/2013  . Artery disease, cerebral 10/26/2013  . Diverticulitis of colon 10/26/2013  . Acid reflux  10/26/2013  . Calcium blood increased 10/26/2013  . HLD (hyperlipidemia) 10/26/2013    Blythe Stanford, PT DPT 03/18/2017, 3:56 PM  [facility] [address], [place], [address] Phone: [phone]   Fax:  [phone]  Name: Jenny [name] MRN: [id number] Date of Birth: [birth year]

## 2017-03-23 ENCOUNTER — Ambulatory Visit: Payer: Medicare Other

## 2017-03-25 ENCOUNTER — Ambulatory Visit: Payer: Medicare Other

## 2017-03-25 DIAGNOSIS — M79672 Pain in left foot: Secondary | ICD-10-CM

## 2017-03-25 DIAGNOSIS — G8929 Other chronic pain: Secondary | ICD-10-CM

## 2017-03-25 DIAGNOSIS — M545 Low back pain: Principal | ICD-10-CM

## 2017-03-25 DIAGNOSIS — M79671 Pain in right foot: Secondary | ICD-10-CM

## 2017-03-25 NOTE — Therapy (Signed)
[facility] [address] [place], [address] Phone: [phone]   Fax:  [phone]  Physical Therapy Treatment  Patient Details  Name: Jenny [name] MRN: [id number] Date of Birth: May 09, 1928 Referring Provider: Sharlet Salina  Encounter Date: 03/25/2017      PT End of Session - 03/25/17 1531    Visit Number 18   Number of Visits 32   Date for PT Re-Evaluation 03/31/17   Authorization Type 8/ 10 G Code   PT Start Time 6962   PT Stop Time 1430   PT Time Calculation (min) 45 min   Activity Tolerance Patient tolerated treatment well   Behavior During Therapy [facility] for tasks assessed/performed      Past Medical History:  Diagnosis Date  . HBP (high blood pressure)   . Reflux   . Skin cancer     Past Surgical History:  Procedure Laterality Date  . BREAST BIOPSY Right   . ESOPHAGOGASTRODUODENOSCOPY (EGD) WITH PROPOFOL N/A 04/26/2015   Procedure: ESOPHAGOGASTRODUODENOSCOPY (EGD) WITH PROPOFOL;  Surgeon: Manya Silvas, MD;  Location: [facility] ENDOSCOPY;  Service: Endoscopy;  Laterality: N/A;  . NECK SURGERY      There were no vitals filed for this visit.      Subjective Assessment - 03/25/17 1528    Subjective Patient reports no improvement in pain over the past visit. Patient reports her pain is improved for 2 hours after therapy but then returns the next day.    Pertinent History Patient denies PMH; Reports pain in both feet and the back have worsened since the beginning of this year.    Limitations Lifting;Walking;Standing   How long can you stand comfortably? 47min   Diagnostic tests X -Ray   Currently in Pain? No/denies   Pain Onset More than a month ago         TREATMENT Manual therapy: Performed STM to patients B feet along plantar fascia, interosseous musculature, and toe flexor muscles. Patient's positioned in long sitting while this was performed and the goal's to decrease  increased muscular spasms and pain. Performed STM to patient's lumbar musculature most notably to the multifidi to decrease increased spasms and pain with patient positioned in prone.  Lumbar mobilizations in prone Grade I-II to decrease pain and spasms to the pain lumbar spine L2-S1 2 x 30sec on each segment.   Patient demonstrates decreased pain on the bottom of her feet at the end of the session        PT Education - 03/25/17 1530    Education provided Yes   Education Details Plan of care   Person(s) Educated Patient   Methods Explanation;Demonstration   Comprehension Returned demonstration;Verbalized understanding             PT Long Term Goals - 03/03/17 1344      PT LONG TERM GOAL #1   Title Patient will be independent with HEP to continue benefits of therapy after discharge.   Baseline Depenedent with exercise technique and progression; Requires frequent cues for correct exercise performance; minimal cueing for exercise performance   Time 6   Period Weeks   Status On-going     PT LONG TERM GOAL #2   Title Patient will improve MODI to under 10% to demonstrate improved ability to stand without pain.   Baseline MODI: 46%; 01/29/2017: 40%; 03/03/2017: 36%   Time 6   Period Weeks   Status On-going     PT LONG TERM  GOAL #3   Title Patient will be able to walk without onset of pain to allow for patient to perform ADLs with greater comfort.    Baseline onset of pain as soon as she stands; 01/29/2017: increased pain as she stands but more in feet than lumbar spine; 03/03/2017: Onset of pain with walking and weight shifting   Time 6   Period Weeks   Status On-going               Plan - 2017-04-11 1531    Clinical Impression Statement Patient has made limited improvement with therapy, demonstrating poor carryover between visitation sessions. Patient demonstrates decreased pain after performance of STM to the plantar fascia/ flexor digitorum brevis on the affected side;  however, pain continues 2 hours after therapy. Patient demonstrates no improvement in pain after performing physical therapy to the patient's lumbar spine. Patient continues to experience increased foot and lumbar pain and the current plan is to postpone therapy and have patient return to doctor for further evaluation.    Clinical Impairments Affecting Rehab Potential (+) highly motivated (-) age and bilateral pain along plantar aspect of the foot   PT Frequency 2x / week   PT Duration 6 weeks   PT Treatment/Interventions Aquatic Therapy;ADLs/Self Care Home Management;Cryotherapy;Electrical Stimulation;Iontophoresis 4mg /ml Dexamethasone;Moist Heat;Traction;Balance training;Therapeutic exercise;Therapeutic activities;Gait training;Stair training;Functional mobility training;Neuromuscular re-education;Patient/family education;Passive range of motion;Manual techniques;Dry needling   PT Next Visit Plan Progress strengthening, improving ankle coordination   PT Home Exercise Plan Towel scrunches, resisted plantarflexion with GTB   Consulted and Agree with Plan of Care Patient      Patient will benefit from skilled therapeutic intervention in order to improve the following deficits and impairments:  Pain, Impaired sensation, Increased fascial restricitons, Decreased coordination, Decreased mobility, Hypermobility, Increased muscle spasms, Postural dysfunction, Decreased range of motion, Decreased endurance, Decreased activity tolerance, Decreased strength, Decreased balance, Difficulty walking  Visit Diagnosis: Chronic left-sided low back pain, with sciatica presence unspecified  Pain in right foot  Pain in left foot       G-Codes - 04-11-2017 1547    Functional Assessment Tool Used (Outpatient Only) clinical judgement, MODI, MMT, AROM   Functional Limitation Changing and maintaining body position   Changing and Maintaining Body Position Current Status (O5929) At least 20 percent but less than 40  percent impaired, limited or restricted   Changing and Maintaining Body Position Goal Status (W4462) At least 1 percent but less than 20 percent impaired, limited or restricted   Changing and Maintaining Body Position Discharge Status (M6381) At least 20 percent but less than 40 percent impaired, limited or restricted      Problem List Patient Active Problem List   Diagnosis Date Noted  . Avitaminosis D 06/07/2015  . Back ache 11/22/2013  . Artery disease, cerebral 10/26/2013  . Diverticulitis of colon 10/26/2013  . Acid reflux 10/26/2013  . Calcium blood increased 10/26/2013  . HLD (hyperlipidemia) 10/26/2013    Blythe Stanford, PT DPT 11-Apr-2017, 3:47 PM  [facility] [address] [place], [address] Phone: [phone]   Fax:  [phone]  Name: MELYSA SCHROYER MRN: [id number] Date of Birth: [birth year]

## 2017-04-01 ENCOUNTER — Ambulatory Visit: Payer: Medicare Other

## 2017-07-15 IMAGING — CT CT ABD-PELV W/ CM
2 of 5 series · 15 of 46 positions shown, 17 images · IV contrast (omnipaque)
Comparison: None.

ADDENDUM:
I forgot to mention in the impression the presence of a 5 x 8 mm
right middle lobe pulmonary nodule. If the patient is at high risk
for bronchogenic carcinoma, follow-up chest CT at 3-9months is
recommended. If the patient is at low risk for bronchogenic
carcinoma, follow-up chest CT at 6-12 months is recommended. This
recommendation follows the consensus statement: Guidelines for
Management of Small Pulmonary Nodules Detected on CT Scans: A
Statement from the [HOSPITAL] as published in Radiology
0777; [DATE].
CLINICAL DATA: Left upper and lower quadrant abdominal pain for 1
month. No previous relevant surgery. Initial encounter.

EXAM:
CT ABDOMEN AND PELVIS WITH CONTRAST
TECHNIQUE: Multidetector CT imaging of the abdomen and pelvis was performed
using the standard protocol following bolus administration of
intravenous contrast.
CONTRAST:  100mL OMNIPAQUE IOHEXOL 300 MG/ML  SOLN

[Series 2: routine with · axial · 0.70mm/px · z∈[-1025,-635]mm · 12 of 88 slices shown, 14 images]
[im 5/88  soft-tissue]
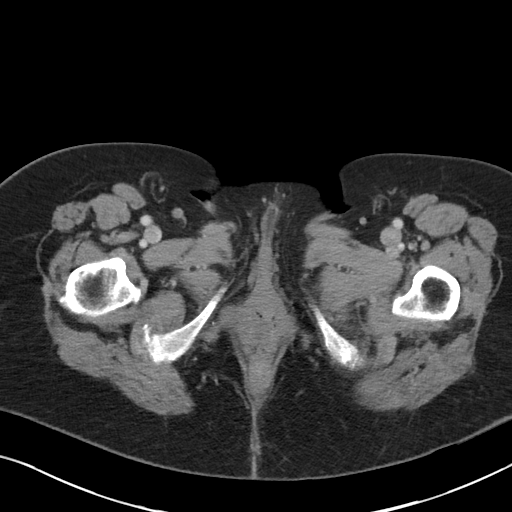
[im 5/88  bone]
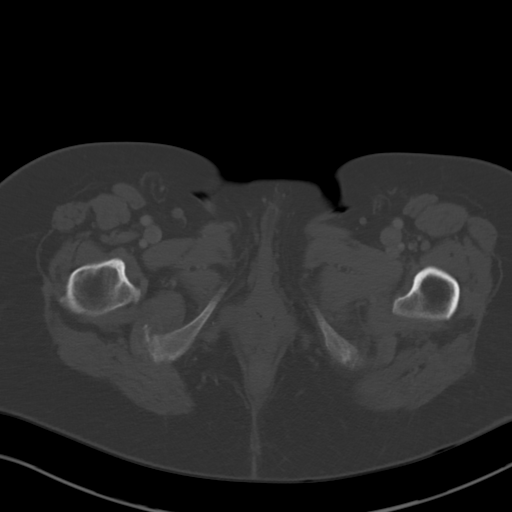
[im 14/88  soft-tissue]
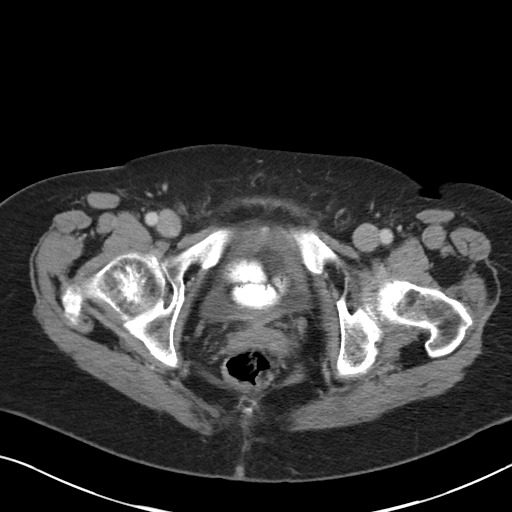
[im 19/88  soft-tissue]
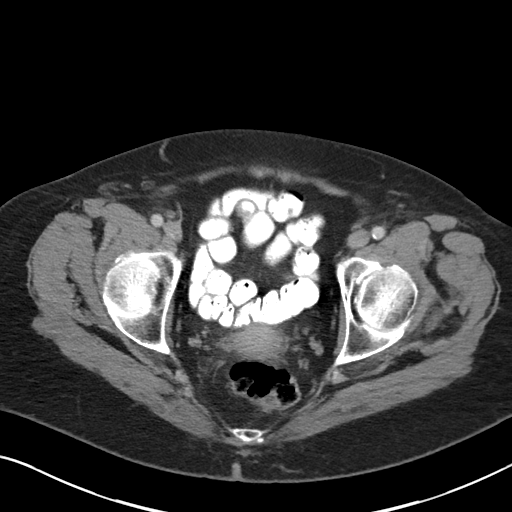
[im 28/88  soft-tissue]
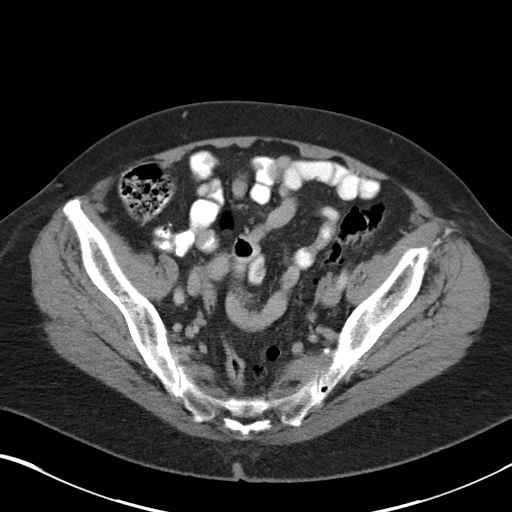
[im 33/88  soft-tissue]
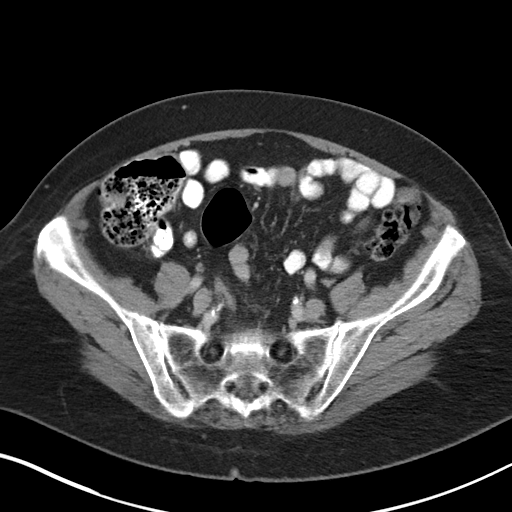
[im 42/88  soft-tissue]
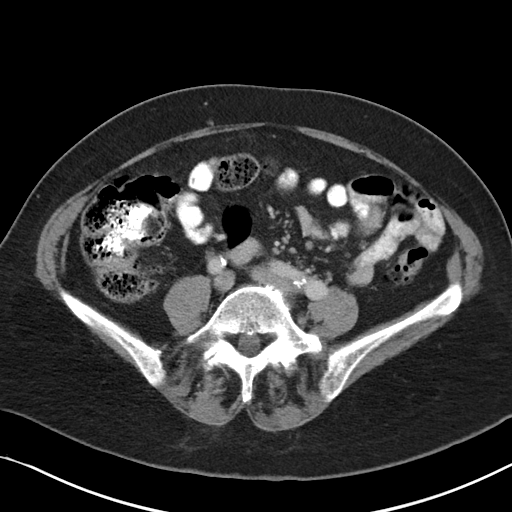
[im 46/88  soft-tissue]
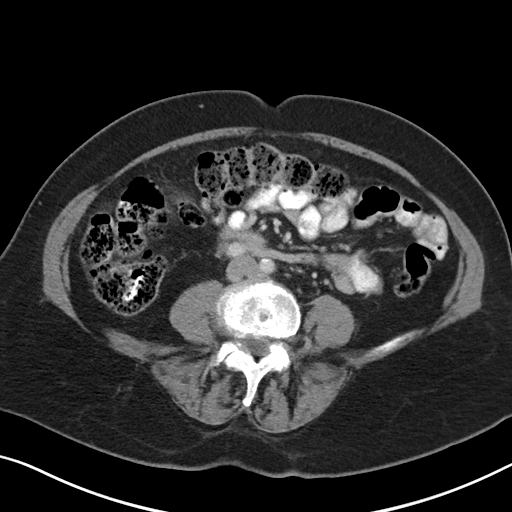
[im 55/88  soft-tissue]
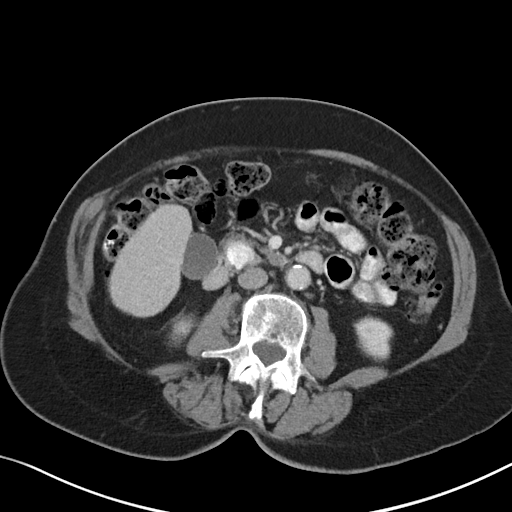
[im 60/88  soft-tissue]
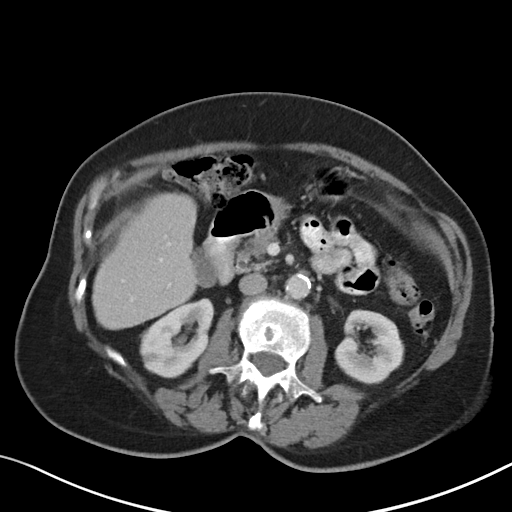
[im 60/88  bone]
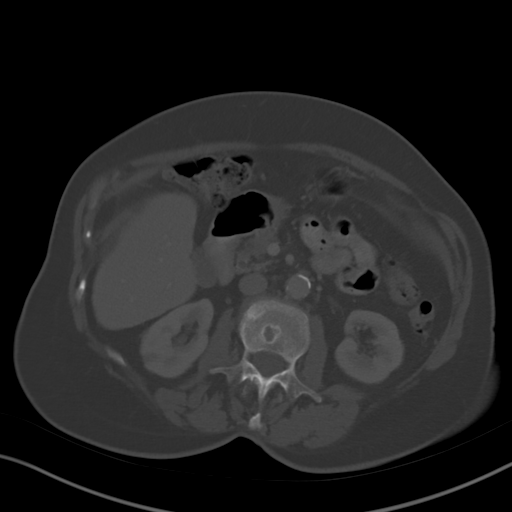
[im 69/88  soft-tissue]
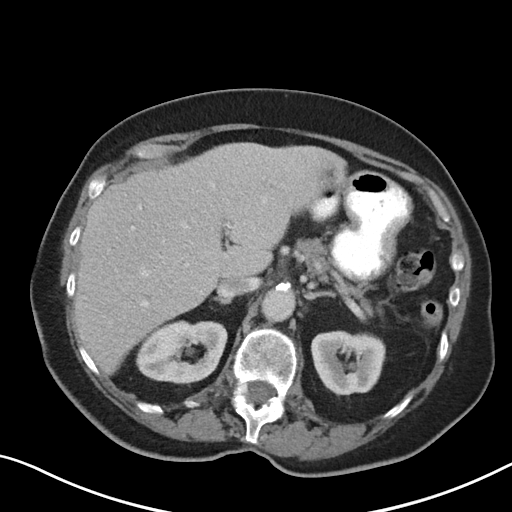
[im 74/88  soft-tissue]
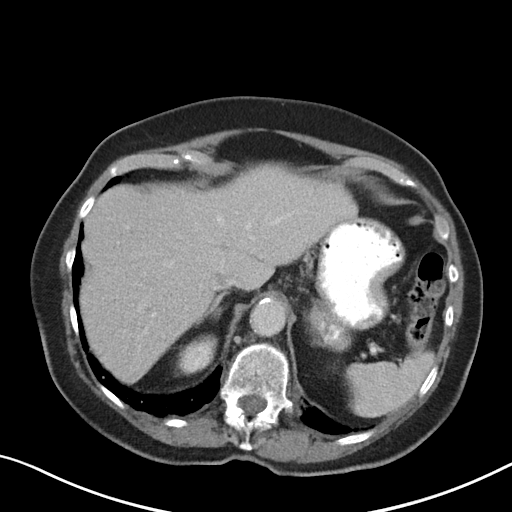
[im 83/88  soft-tissue]
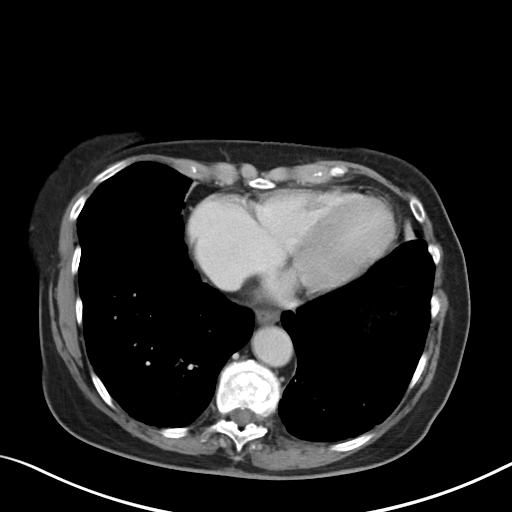

[Series 5: cor routine with · coronal · 0.68mm/px · 3 of 135 slices shown]
[im 45/135  soft-tissue]
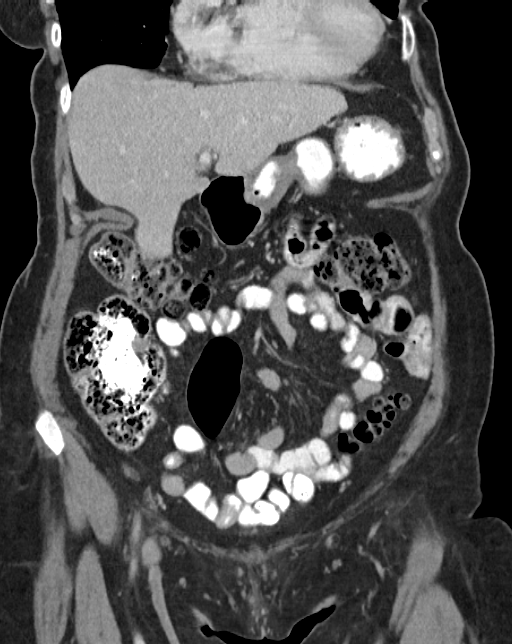
[im 60/135  soft-tissue]
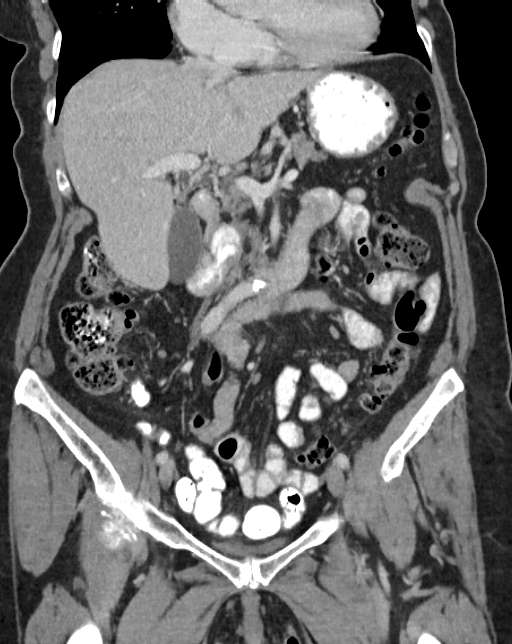
[im 75/135  soft-tissue]
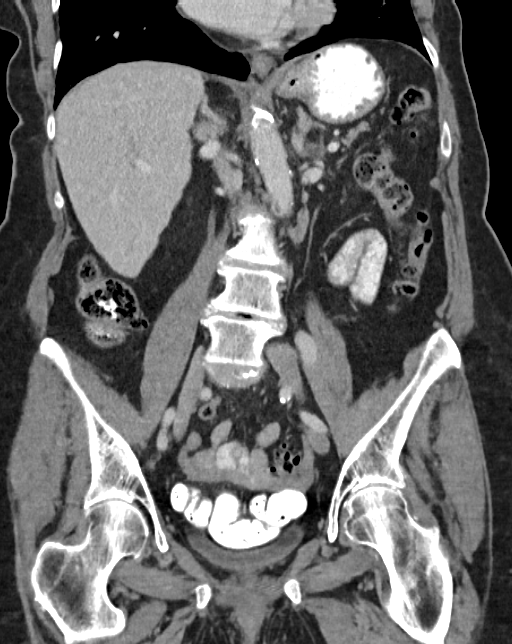

[15 of 46 positions shown; findings below may reference images not displayed]

FINDINGS: Lower chest: There is an 8 x 5 mm right middle lobe nodule on image
3. Mild dependent atelectasis is present at both lung bases. There
is no significant pleural or pericardial effusion.

Hepatobiliary: There is minimal contour irregularity of the liver
which could reflect early cirrhosis. No focal lesions are
identified. No evidence of gallstones, gallbladder wall thickening
or biliary dilatation.

Pancreas: Pancreatic atrophy without focal abnormality or
surrounding inflammation.

Spleen: Normal in size without focal abnormality.

Adrenals/Urinary Tract: Both adrenal glands appear normal. There are
small bilateral renal cysts. No hydronephrosis, urinary tract
calculus or bladder abnormality identified. The bladder is nearly
empty and suboptimally evaluated.

Stomach/Bowel: No evidence of bowel wall thickening, distention or
surrounding inflammatory change. There are moderate diverticular
changes throughout the descending and sigmoid colon without
surrounding inflammation. The appendix appears normal. There is
prominent stool throughout the colon. There is possible wall
thickening of the distal stomach versus incomplete distention. This
does persist on the delayed images obtained through the kidneys.
There is a diverticulum involving the duodenum at the junction of
its second and third portions.

Vascular/Lymphatic: There are no enlarged abdominal or pelvic lymph
nodes. There is atherosclerosis of the aorta, its branches and the
iliac arteries.

Reproductive: Unremarkable.

Other: There is a small umbilical hernia containing only fat.

Musculoskeletal: No acute or significant osseous findings. There is
mild spondylosis associated with a convex left scoliosis.
IMPRESSION: 1. No acute findings or clear explanation for the patient's
symptoms.
2. Cannot exclude wall thickening in the distal stomach, as can be
seen with inflammation, infiltrating neoplasm or incomplete
distension/peristalsis. Endoscopic evaluation should be considered.
3. There is moderate diverticulosis of the descending and sigmoid
colon, although no evidence of associated acute inflammation.
4. Additional findings include the presence of moderate stool
throughout the colon, small renal cysts, mild atherosclerosis and
potential early changes of cirrhosis.

## 2018-04-06 ENCOUNTER — Other Ambulatory Visit: Payer: Self-pay | Admitting: Cardiology

## 2018-04-07 ENCOUNTER — Ambulatory Visit: Payer: Medicare Other | Admitting: Anesthesiology

## 2018-04-07 ENCOUNTER — Encounter
Admission: RE | Disposition: A | Discharge status: Home / Self Care | Payer: Self-pay | Source: Ambulatory Visit | Attending: Cardiology

## 2018-04-07 ENCOUNTER — Ambulatory Visit
Admission: RE | Admit: 2018-04-07 | Discharge: 2018-04-07 | Disposition: A | Discharge status: Home / Self Care | Payer: Medicare Other | Source: Ambulatory Visit | Attending: Cardiology | Admitting: Cardiology

## 2018-04-07 ENCOUNTER — Encounter: Payer: Self-pay | Admitting: Emergency Medicine

## 2018-04-07 DIAGNOSIS — I272 Pulmonary hypertension, unspecified: Secondary | ICD-10-CM | POA: Insufficient documentation

## 2018-04-07 DIAGNOSIS — Z79899 Other long term (current) drug therapy: Secondary | ICD-10-CM | POA: Insufficient documentation

## 2018-04-07 DIAGNOSIS — Z8673 Personal history of transient ischemic attack (TIA), and cerebral infarction without residual deficits: Secondary | ICD-10-CM | POA: Insufficient documentation

## 2018-04-07 DIAGNOSIS — Z7901 Long term (current) use of anticoagulants: Secondary | ICD-10-CM | POA: Diagnosis not present

## 2018-04-07 DIAGNOSIS — R531 Weakness: Secondary | ICD-10-CM | POA: Diagnosis not present

## 2018-04-07 DIAGNOSIS — Z881 Allergy status to other antibiotic agents status: Secondary | ICD-10-CM | POA: Insufficient documentation

## 2018-04-07 DIAGNOSIS — E782 Mixed hyperlipidemia: Secondary | ICD-10-CM | POA: Diagnosis not present

## 2018-04-07 DIAGNOSIS — I4819 Other persistent atrial fibrillation: Secondary | ICD-10-CM | POA: Insufficient documentation

## 2018-04-07 DIAGNOSIS — K219 Gastro-esophageal reflux disease without esophagitis: Secondary | ICD-10-CM | POA: Insufficient documentation

## 2018-04-07 HISTORY — PX: CARDIOVERSION: EP1203

## 2018-04-07 SURGERY — CARDIOVERSION (CATH LAB)
Anesthesia: General

## 2018-04-07 MED ORDER — PROPOFOL 10 MG/ML IV BOLUS
INTRAVENOUS | Status: AC
  Filled 2018-04-07: qty 20

## 2018-04-07 MED ORDER — SODIUM CHLORIDE 0.9% FLUSH: 3.0000 mL | INTRAVENOUS | Status: DC | PRN

## 2018-04-07 MED ORDER — SODIUM CHLORIDE 0.9% FLUSH: 3.0000 mL | Freq: Two times a day (BID) | INTRAVENOUS | Status: DC

## 2018-04-07 MED ORDER — PROPOFOL 10 MG/ML IV BOLUS
INTRAVENOUS | Status: DC | PRN
  Administered 2018-04-07: 30 mg via INTRAVENOUS

## 2018-04-07 MED ORDER — SODIUM CHLORIDE 0.9 % IV SOLN
250.0000 mL | INTRAVENOUS | Status: DC
  Administered 2018-04-07: 250 mL via INTRAVENOUS

## 2018-04-07 NOTE — Anesthesia Postprocedure Evaluation (Signed)
Anesthesia Post Note  Patient: Jenny [name]  Procedure(s) Performed: CARDIOVERSION (N/A )  Patient location during evaluation: Cath Lab Anesthesia Type: General Level of consciousness: awake and alert Pain management: pain level controlled Vital Signs Assessment: post-procedure vital signs reviewed and stable Respiratory status: spontaneous breathing, nonlabored ventilation, respiratory function stable and patient connected to nasal cannula oxygen Cardiovascular status: blood pressure returned to baseline and stable Postop Assessment: no apparent nausea or vomiting Anesthetic complications: no     Last Vitals:  Vitals:   04/07/18 0800 04/07/18 0815  BP: 125/71 (!) 144/80  Pulse: (!) 59 (!) 58  Resp: 15 20  Temp:    SpO2: 96% 95%    Last Pain:  Vitals:   04/07/18 0815  TempSrc:   PainSc: 0-No pain                 Martha Clan

## 2018-04-07 NOTE — Transfer of Care (Signed)
Immediate Anesthesia Transfer of Care Note  Patient: Jenny [name]  Procedure(s) Performed: CARDIOVERSION (N/A )  Patient Location: PACU  Anesthesia Type:General  Level of Consciousness: awake and sedated  Airway & Oxygen Therapy: Patient Spontanous Breathing and Patient connected to nasal cannula oxygen  Post-op Assessment: Report given to RN and Post -op Vital signs reviewed and stable  Post vital signs: Reviewed and stable  Last Vitals:  Vitals Value Taken Time  BP    Temp    Pulse    Resp    SpO2      Last Pain:  Vitals:   04/07/18 0654  TempSrc: Oral  PainSc: 0-No pain         Complications: No apparent anesthesia complications

## 2018-04-07 NOTE — Procedures (Signed)
Electrical Cardioversion Procedure Note Jenny [name] [id number] 1928/04/24  Procedure: Electrical Cardioversion Indications:  Atrial Fibrillation  Procedure Details Consent: Risks of procedure as well as the alternatives and risks of each were explained to the (patient/caregiver).  Consent for procedure obtained. Time Out: Verified patient identification, verified procedure, site/side was marked, verified correct patient position, special equipment/implants available, medications/allergies/relevent history reviewed, required imaging and test results available.  Performed  Patient placed on cardiac monitor, pulse oximetry, supplemental oxygen as necessary.  Sedation given: propofol Pacer pads placed anterior and posterior chest.  Cardioverted 1 time(s).  Cardioverted at 120J.  Evaluation Findings: Post procedure EKG shows: NSR Complications: None Patient did tolerate procedure well.   Teodoro Spray 04/07/2018, 7:43 AM

## 2018-04-07 NOTE — Anesthesia Preprocedure Evaluation (Signed)
Anesthesia Evaluation  Patient identified by MRN, date of birth, ID band Patient awake    Reviewed: Allergy & Precautions, H&P , NPO status , Patient's Chart, lab work & pertinent test results  History of Anesthesia Complications Negative for: history of anesthetic complications  Airway Mallampati: II  TM Distance: >3 FB Neck ROM: limited    Dental  (+) Poor Dentition, Chipped, Dental Advidsory Given   Pulmonary shortness of breath (secondary to a fib), neg COPD, neg recent URI,           Cardiovascular Exercise Tolerance: Good hypertension, (-) angina(-) CAD, (-) Past MI, (-) Cardiac Stents, (-) CABG and (-) DOE + dysrhythmias Atrial Fibrillation (-) Valvular Problems/Murmurs     Neuro/Psych negative neurological ROS  negative psych ROS   GI/Hepatic Neg liver ROS, GERD  Controlled,  Endo/Other  negative endocrine ROS  Renal/GU negative Renal ROS  negative genitourinary   Musculoskeletal   Abdominal   Peds  Hematology negative hematology ROS (+)   Anesthesia Other Findings Past Medical History:   Reflux                                                       Skin cancer                                                  HBP (high blood pressure)                                   Past Surgical History:   NECK SURGERY                                                  BREAST BIOPSY                                   Right             BMI    Body Mass Index   27.40 kg/m 2      Reproductive/Obstetrics negative OB ROS                             Anesthesia Physical  Anesthesia Plan  ASA: III  Anesthesia Plan: General   Post-op Pain Management:    Induction: Intravenous  PONV Risk Score and Plan: 3 and TIVA  Airway Management Planned: Natural Airway and Nasal Cannula  Additional Equipment:   Intra-op Plan:   Post-operative Plan:   Informed Consent: I have reviewed the patients  History and Physical, chart, labs and discussed the procedure including the risks, benefits and alternatives for the proposed anesthesia with the patient or authorized representative who has indicated his/her understanding and acceptance.   Dental Advisory Given  Plan Discussed with: Anesthesiologist, CRNA and Surgeon  Anesthesia Plan Comments:         Anesthesia Quick Evaluation

## 2018-04-07 NOTE — Anesthesia Procedure Notes (Signed)
Date/Time: 04/07/2018 7:28 AM Performed by: Nelda Marseille, CRNA Pre-anesthesia Checklist: Patient identified, Emergency Drugs available, Suction available, Patient being monitored and Timeout performed Oxygen Delivery Method: Nasal cannula

## 2018-04-07 NOTE — Discharge Instructions (Signed)
Electrical Cardioversion, Care After °This sheet gives you information about how to care for yourself after your procedure. Your health care provider may also give you more specific instructions. If you have problems or questions, contact your health care provider. °What can I expect after the procedure? °After the procedure, it is common to have: °· Some redness on the skin where the shocks were given. ° °Follow these instructions at home: °· Do not drive for 24 hours if you were given a medicine to help you relax (sedative). °· Take over-the-counter and prescription medicines only as told by your health care provider. °· Ask your health care provider how to check your pulse. Check it often. °· Rest for 48 hours after the procedure or as told by your health care provider. °· Avoid or limit your caffeine use as told by your health care provider. °Contact a health care provider if: °· You feel like your heart is beating too quickly or your pulse is not regular. °· You have a serious muscle cramp that does not go away. °Get help right away if: °· You have discomfort in your chest. °· You are dizzy or you feel faint. °· You have trouble breathing or you are short of breath. °· Your speech is slurred. °· You have trouble moving an arm or leg on one side of your body. °· Your fingers or toes turn cold or blue. °This information is not intended to replace advice given to you by your health care provider. Make sure you discuss any questions you have with your health care provider. °Document Released: 04/13/2013 Document Revised: 01/25/2016 Document Reviewed: 12/28/2015 °Elsevier Interactive Patient Education © 2018 [employer] °General Anesthesia, Adult, Care After °These instructions provide you with information about caring for yourself after your procedure. Your health care provider may also give you more specific instructions. Your treatment has been planned according to current medical practices, but problems  sometimes occur. Call your health care provider if you have any problems or questions after your procedure. °What can I expect after the procedure? °After the procedure, it is common to have: °· Vomiting. °· A sore throat. °· Mental slowness. ° °It is common to feel: °· Nauseous. °· Cold or shivery. °· Sleepy. °· Tired. °· Sore or achy, even in parts of your body where you did not have surgery. ° °Follow these instructions at home: °For at least 24 hours after the procedure: °· Do not: °? Participate in activities where you could fall or become injured. °? Drive. °? Use heavy machinery. °? Drink alcohol. °? Take sleeping pills or medicines that cause drowsiness. °? Make important decisions or sign legal documents. °? Take care of children on your own. °· Rest. °Eating and drinking °· If you vomit, drink water, juice, or soup when you can drink without vomiting. °· Drink enough fluid to keep your urine clear or pale yellow. °· Make sure you have little or no nausea before eating solid foods. °· Follow the diet recommended by your health care provider. °General instructions °· Have a responsible adult stay with you until you are awake and alert. °· Return to your normal activities as told by your health care provider. Ask your health care provider what activities are safe for you. °· Take over-the-counter and prescription medicines only as told by your health care provider. °· If you smoke, do not smoke without supervision. °· Keep all follow-up visits as told by your health care provider. This is important. °Contact a   health care provider if: °· You continue to have nausea or vomiting at home, and medicines are not helpful. °· You cannot drink fluids or start eating again. °· You cannot urinate after 8-12 hours. °· You develop a skin rash. °· You have fever. °· You have increasing redness at the site of your procedure. °Get help right away if: °· You have difficulty breathing. °· You have chest pain. °· You have  unexpected bleeding. °· You feel that you are having a life-threatening or urgent problem. °This information is not intended to replace advice given to you by your health care provider. Make sure you discuss any questions you have with your health care provider. °Document Released: 09/29/2000 Document Revised: 11/26/2015 Document Reviewed: 06/07/2015 °Elsevier Interactive Patient Education © 2018 [employer] ° °

## 2018-04-07 NOTE — Anesthesia Post-op Follow-up Note (Signed)
Anesthesia QCDR form completed.        

## 2018-04-08 NOTE — H&P (Signed)
Chief Complaint: Chief Complaint  Patient presents with  . Follow-up  4 wks A-fib  Date of Service: 04/01/2018 Date of Birth: Apr 08, 1928 PCP: Barnabas Lister, MD  History of Present Illness: Jenny [name] is a 84 y.o.female patient with a past medical history significant for persistent atrial fibrillation, on Eliquis, hyperlipidemia, and history of a CVA who presents for a follow up visit. Since starting amiodarone, patient reports a slight improvement in her symptoms, but is still experiencing mild chest pain, shortness of breath, fatigue, and weakness. Has been feeling this way for the past several months. Doing well on Eliquis without significant bruising or bleeding. Denies any recent falls or syncopal episodes. We discussed the option of cardioversion and she wishes to proceed at this time.   We also discussed recent stress test and echocardiogram results. Stress test revealed no ischemia with normal heart function. Echocardiogram revealed normal left ventricular systolic function with mild LVH, moderate TR, moderate PHTN, and mild MR.   Past Medical and Surgical History  Past Medical History Past Medical History:  Diagnosis Date  . Anemia  . AVM (arteriovenous malformation) of small bowel, acquired  . Barrett's esophagus  . Chronic headaches  . GERD (gastroesophageal reflux disease)  . History of CVA (cerebrovascular accident)  . History of diverticulitis  . History of hypercalcemia  . Hyperlipidemia  . Hyperparathyroidism (CMS-HCC)  . Tubular adenoma   Past Surgical History She has a past surgical history that includes hypoparathyroid; Cataract extraction; Colonoscopy (08/12/2006); Colonoscopy (10/28/2007); egd (08/12/2006); egd (10/28/2007); and egd (04/26/2015).   Medications and Allergies  Current Medications  Current Outpatient Medications  Medication Sig Dispense Refill  . albuterol (PROAIR HFA) 90 mcg/actuation inhaler Inhale 2 inhalations into the lungs every 6  (six) hours as needed for Wheezing 1 Inhaler 2  . AMIOdarone (PACERONE) 200 MG tablet Take 1 tablet (200 mg total) by mouth once daily Take 2 tabs twice a day X 1 week, Take 1 tab twice a day X 3 weeks, take 1 tab once a day 30 tablet 11  . cholecalciferol (VITAMIN D3) 1,000 unit capsule Take 1,000 Units by mouth once daily.  Marland Kitchen diltiazem (DILT-XR) 120 MG XR capsule Take 1 capsule (120 mg total) by mouth once daily 90 capsule 1  . DULoxetine (CYMBALTA) 30 MG DR capsule TAKE THREE CAPSULES DAILY 270 capsule 1  . ELIQUIS 5 mg tablet TAKE 1 TABLET BY MOUTH EVERY 12 HOURS 60 tablet 1  . multivitamin tablet Take 1 tablet by mouth once daily.  . pantoprazole (PROTONIX) 40 MG DR tablet Take 1 tablet (40 mg total) by mouth once daily 90 tablet 3  . simvastatin (ZOCOR) 20 MG tablet TAKE 1 TABLET BY MOUTH AT BEDTIME 90 tablet 0   No current facility-administered medications for this visit.   Allergies: Flagyl [metronidazole hcl] and Metronidazole  Social and Family History  Social History reports that she has never smoked. She has never used smokeless tobacco. She reports that she does not drink alcohol or use drugs.  Family History Family History  Problem Relation Age of Onset  . Leukemia Mother  . Osteoporosis (Thinning of bones) Mother  . Emphysema Father  . Myocardial Infarction (Heart attack) Father   Review of Systems   Review of Systems: The patient denies chest pain, shortness of breath, orthopnea, paroxysmal nocturnal dyspnea, pedal edema, palpitations, heart racing, fatigue, dizziness, lightheadedness, presyncope, syncope, leg pain, leg cramping. Review of 12 Systems is negative except as described in HPI.   Physical  Examination   Vitals:BP 118/76  Pulse 66  Ht 170.2 cm (5\' 7" )  Wt 87.3 kg (192 lb 7.4 oz)  BMI 30.14 kg/m  Ht:170.2 cm (5\' 7" ) Wt:87.3 kg (192 lb 7.4 oz) LXB:WIOM surface area is 2.03 meters squared. Body mass index is 30.14 kg/m.  General: Well developed, well  nourished. In no acute distress HEENT: Pupils equally reactive to light and accomodation  Neck: Supple without thyromegaly, or goiter. Carotid pulses 2+. No carotid bruits present.  Pulmonary: Clear to auscultation bilaterally; no wheezes, rales, rhonchi Cardiovascular: Irregularly irregular. Rate 57. No gallops, murmurs or rubs Gastrointestinal: Soft nontender, nondistended, with normal bowel sounds Extremities: No cyanosis or clubbing. Trace peripheral edema bilaterally  Peripheral Pulses: 2+ in upper extremities, 2+ in lower extremities  Neurology: Alert and oriented X3 Pysch: Good affect. Responds appropriately  ECG: Atrial fibrillation with slow ventricular response  Vent rate: 57bpm, PR interval: n/a, QRS duration: 64ms, QT/QTc: 410/367ms  Assessment   84 y.o. female with  1. History of atrial fibrillation  2. Atrial fibrillation, unspecified type (CMS-HCC)  3. Mixed hyperlipidemia  4. Cerebrovascular disease   Plan  1. Atrial fibrillation -Cardioversion scheduled at today's visit -Continue Eliquis 5 mg twice daily -Continue amiodarone 200 mg twice daily until 04/07/18; then begin amiodarone 200 mg once daily; will continue to monitor LFTs, PFTs, and thyroid function  -Continue diltiazem 120 mg once daily 2. Mixed hyperlipidemia -Continue simvastatin 20 mg once nightly -LDL goal <100 3. History of cerebrovascular disease -Continue with Eliquis 5mg  twice daily   Orders Placed This Encounter  Procedures  . CBC w/auto Differential (5 Part)  . Basic Metabolic Panel (BMP)  . ECG 12-lead   Return after cardioversion.  I personally performed the service, non-incident to. (WP)  Jenny LYNN STEPHENS, PA    Pt seen and examnined. No change from above.

## 2018-08-17 ENCOUNTER — Other Ambulatory Visit: Payer: Self-pay | Admitting: Student

## 2018-08-17 DIAGNOSIS — K219 Gastro-esophageal reflux disease without esophagitis: Secondary | ICD-10-CM

## 2018-08-17 DIAGNOSIS — R0789 Other chest pain: Secondary | ICD-10-CM

## 2018-08-24 ENCOUNTER — Ambulatory Visit: Payer: Medicare Other

## 2018-08-25 ENCOUNTER — Ambulatory Visit
Admission: RE | Admit: 2018-08-25 | Discharge: 2018-08-25 | Disposition: A | Discharge status: Home / Self Care | Payer: Medicare Other | Source: Ambulatory Visit | Attending: Student | Admitting: Student

## 2018-08-25 DIAGNOSIS — R0789 Other chest pain: Secondary | ICD-10-CM | POA: Insufficient documentation

## 2018-08-25 DIAGNOSIS — K219 Gastro-esophageal reflux disease without esophagitis: Secondary | ICD-10-CM | POA: Insufficient documentation

## 2019-01-24 ENCOUNTER — Emergency Department: Payer: Medicare Other

## 2019-01-24 ENCOUNTER — Other Ambulatory Visit: Payer: Self-pay

## 2019-01-24 ENCOUNTER — Encounter: Payer: Self-pay | Admitting: Emergency Medicine

## 2019-01-24 ENCOUNTER — Emergency Department
Admission: EM | Admit: 2019-01-24 | Discharge: 2019-01-24 | Disposition: A | Discharge status: Home / Self Care | Payer: Medicare Other | Attending: Emergency Medicine | Admitting: Emergency Medicine

## 2019-01-24 DIAGNOSIS — Y939 Activity, unspecified: Secondary | ICD-10-CM | POA: Insufficient documentation

## 2019-01-24 DIAGNOSIS — Z7901 Long term (current) use of anticoagulants: Secondary | ICD-10-CM | POA: Diagnosis not present

## 2019-01-24 DIAGNOSIS — X501XXA Overexertion from prolonged static or awkward postures, initial encounter: Secondary | ICD-10-CM | POA: Diagnosis not present

## 2019-01-24 DIAGNOSIS — S8992XA Unspecified injury of left lower leg, initial encounter: Secondary | ICD-10-CM | POA: Insufficient documentation

## 2019-01-24 DIAGNOSIS — Y929 Unspecified place or not applicable: Secondary | ICD-10-CM | POA: Insufficient documentation

## 2019-01-24 DIAGNOSIS — Y999 Unspecified external cause status: Secondary | ICD-10-CM | POA: Diagnosis not present

## 2019-01-24 DIAGNOSIS — Z85828 Personal history of other malignant neoplasm of skin: Secondary | ICD-10-CM | POA: Diagnosis not present

## 2019-01-24 DIAGNOSIS — Z79899 Other long term (current) drug therapy: Secondary | ICD-10-CM | POA: Insufficient documentation

## 2019-01-24 HISTORY — DX: Disorder of thyroid, unspecified: E07.9

## 2019-01-24 LAB — CBC
HCT: 42 % (ref 36.0–46.0)
Hemoglobin: 13.5 g/dL (ref 12.0–15.0)
MCH: 29.6 pg (ref 26.0–34.0)
MCHC: 32.1 g/dL (ref 30.0–36.0)
MCV: 92.1 fL (ref 80.0–100.0)
Platelets: 265 10*3/uL (ref 150–400)
RBC: 4.56 MIL/uL (ref 3.87–5.11)
RDW: 13.6 % (ref 11.5–15.5)
WBC: 7.1 10*3/uL (ref 4.0–10.5)
nRBC: 0 % (ref 0.0–0.2)

## 2019-01-24 LAB — BASIC METABOLIC PANEL
Anion gap: 9 (ref 5–15)
BUN: 24 mg/dL — ABNORMAL HIGH (ref 8–23)
CO2: 26 mmol/L (ref 22–32)
Calcium: 9.5 mg/dL (ref 8.9–10.3)
Chloride: 102 mmol/L (ref 98–111)
Creatinine, Ser: 1.02 mg/dL — ABNORMAL HIGH (ref 0.44–1.00)
GFR calc Af Amer: 57 mL/min — ABNORMAL LOW (ref >60)
GFR calc non Af Amer: 49 mL/min — ABNORMAL LOW (ref >60)
Glucose, Bld: 141 mg/dL — ABNORMAL HIGH (ref 70–99)
Potassium: 4.3 mmol/L (ref 3.5–5.1)
Sodium: 137 mmol/L (ref 135–145)

## 2019-01-24 LAB — T4, FREE: Free T4: 1.05 ng/dL (ref 0.61–1.12)

## 2019-01-24 LAB — TSH: TSH: 7.381 u[IU]/mL — ABNORMAL HIGH (ref 0.350–4.500)

## 2019-01-24 NOTE — ED Notes (Signed)
Report left leg pain, denies fall. Reports got up last night to go to bathroom and wisted it in a wrong way. Unable to bare wt w/o pain today. No obvious deformity or swelling noted.

## 2019-01-24 NOTE — ED Notes (Signed)
Arrives to ER via [place] c/o fall on Sunday, knee pain since.

## 2019-01-24 NOTE — ED Notes (Signed)
Md at bedside Patient awaiting xrays.

## 2019-01-24 NOTE — ED Provider Notes (Signed)
[facility] Emergency Department Provider Note ____________________________________________   First MD Initiated Contact with Patient 01/24/19 1216     (approximate)  I have reviewed the triage vital signs and the nursing notes.   HISTORY  Chief Complaint Knee Injury and Thyroid Problem    HPI Jenny [name] is a 85 y.o. female with PMH as noted below who presents primarily with left knee injury acute onset last night when she felt like he twisted the wrong way.  She has been unable to bear weight on it today.  She had no direct impact to the knee.  She denies any weakness or numbness.  The patient also reports that she has had increased generalized fatigue over the last few weeks.  She states that it feels somewhat like before she had her thyroid surgery and was put on levothyroxine, so she wanted to get her thyroid checked.  Past Medical History:  Diagnosis Date  . HBP (high blood pressure)   . Reflux   . Skin cancer   . Thyroid disease     Patient Active Problem List   Diagnosis Date Noted  . Avitaminosis D 06/07/2015  . Back ache 11/22/2013  . Artery disease, cerebral 10/26/2013  . Diverticulitis of colon 10/26/2013  . Acid reflux 10/26/2013  . Calcium blood increased 10/26/2013  . HLD (hyperlipidemia) 10/26/2013    Past Surgical History:  Procedure Laterality Date  . BREAST BIOPSY Right   . CARDIOVERSION N/A 04/07/2018   Procedure: CARDIOVERSION;  Surgeon: Teodoro Spray, MD;  Location: [facility] ORS;  Service: Cardiovascular;  Laterality: N/A;  . ESOPHAGOGASTRODUODENOSCOPY (EGD) WITH PROPOFOL N/A 04/26/2015   Procedure: ESOPHAGOGASTRODUODENOSCOPY (EGD) WITH PROPOFOL;  Surgeon: Manya Silvas, MD;  [facility] ENDOSCOPY;  Service: Endoscopy;  Laterality: N/A;  . NECK SURGERY      Prior to Admission medications   Medication Sig Start Date End Date Taking? Authorizing Provider  amiodarone (PACERONE) 200 MG tablet Take 200 mg by mouth  daily.    [provider]  apixaban (ELIQUIS) 5 MG TABS tablet Take 5 mg by mouth 2 (two) times daily.    [provider]  Cholecalciferol (VITAMIN D-3 PO) Take by mouth daily.    [provider]  citalopram (CELEXA) 20 MG tablet  09/06/15   [provider]  clopidogrel (PLAVIX) 75 MG tablet  05/07/13   [provider]  diclofenac sodium (VOLTAREN) 1 % GEL Apply 4 g topically 4 (four) times daily. Patient not taking: Reported on 04/07/2018 03/24/16   Tyson Dense T, DPM  diltiazem (DILACOR XR) 120 MG 24 hr capsule Take 120 mg by mouth daily.    [provider]  DULoxetine (CYMBALTA) 30 MG capsule Take 90 mg by mouth daily.    [provider]  MELATONIN PO Take by mouth daily.    [provider]  meloxicam (MOBIC) 15 MG tablet Take 1 tablet (15 mg total) by mouth daily. 10/03/15   Hyatt, Max T, DPM  Multiple Vitamins-Minerals (MULTIVITAMIN PO) Take by mouth daily.    [provider]  NONFORMULARY OR COMPOUNDED ITEM Apply 1-2 g topically 4 (four) times daily. 06/10/16   Edrick Kins, DPM  pantoprazole (PROTONIX) 20 MG tablet  05/07/13   [provider]  simvastatin (ZOCOR) 20 MG tablet  05/07/13   [provider]    Allergies Flagyl [metronidazole]  No family history on file.  Social History Social History   Tobacco Use  . Smoking status:  Never Smoker  . Smokeless tobacco: Never Used  Substance Use Topics  . Alcohol use: No    Alcohol/week: 0.0 standard drinks  . Drug use: No    Review of Systems  Constitutional: No fever.  Positive for fatigue. Eyes: No visual changes. ENT: No sore throat. Cardiovascular: Denies chest pain. Respiratory: Denies shortness of breath. Gastrointestinal: No vomiting or diarrhea.  Genitourinary: Negative for dysuria.  Musculoskeletal: Positive for left knee injury.   Skin: Negative for rash. Neurological: Negative for focal weakness or numbness.    ____________________________________________   PHYSICAL EXAM:  VITAL SIGNS: ED Triage Vitals  Enc Vitals Group     BP 01/24/19 1115 114/71     Pulse Rate 01/24/19 1115 (!) 52     Resp 01/24/19 1115 16     Temp 01/24/19 1115 97.6 F (36.4 C)     Temp Source 01/24/19 1115 Oral     SpO2 01/24/19 1115 95 %     Weight --      Height --      Head Circumference --      Peak Flow --      Pain Score 01/24/19 1116 0     Pain Loc --      Pain Edu? --      Excl. in [place]? --     Constitutional: Alert and oriented. Well appearing for age and in no acute distress. Eyes: Conjunctivae are normal.  Head: Atraumatic. Nose: No congestion/rhinnorhea. Mouth/Throat: Mucous membranes are moist.   Neck: Normal range of motion.  Cardiovascular: Normal rate, regular rhythm. Good peripheral circulation. Respiratory: Normal respiratory effort.  No retractions.  Gastrointestinal: No distention.  Musculoskeletal: No lower extremity edema.  Extremities warm and well perfused.  Left knee with full range of motion, but pain with range of motion.  Mild lateral tenderness.  No deformity or crepitus.  No patellar tenderness.  No gross ligamentous laxity.  2+ distal pulses. Neurologic:  Normal speech and language. No gross focal neurologic deficits are appreciated.  Skin:  Skin is warm and dry. No rash noted. Psychiatric: Mood and affect are normal. Speech and behavior are normal.  ____________________________________________   LABS (all labs ordered are listed, but only abnormal results are displayed)  Labs Reviewed  BASIC METABOLIC PANEL - Abnormal; Notable for the following components:      Result Value   Glucose, Bld 141 (*)    BUN 24 (*)    Creatinine, Ser 1.02 (*)    GFR calc non Af Amer 49 (*)    GFR calc Af Amer 57 (*)    All other components within normal limits  TSH - Abnormal; Notable for the following components:   TSH 7.381 (*)    All other components within normal limits  CBC  T4,  FREE  URINALYSIS, COMPLETE (UACMP) WITH MICROSCOPIC   ____________________________________________  EKG  ED ECG REPORT I, Arta Silence, the attending physician, personally viewed and interpreted this ECG.  Date: 01/24/2019 EKG Time: 1125 Rate: 51 Rhythm: normal sinus rhythm QRS Axis: Left axis Intervals: normal ST/T Wave abnormalities: normal Narrative Interpretation: no evidence of acute ischemia  ____________________________________________  RADIOLOGY  XR L knee: No acute fracture  ____________________________________________   PROCEDURES  Procedure(s) performed: No  Procedures  Critical Care performed: No ____________________________________________   INITIAL IMPRESSION / ASSESSMENT AND PLAN / ED COURSE  Pertinent labs & imaging results that were available during my care of the patient were reviewed by me and considered in my medical  decision making (see chart for details).  85 year old female with PMH as noted above presents with left knee injury last night where she felt like it twisted.  She is now unable to bear weight without significant pain.  She also reports some increased generalized fatigue that feels like before she had her thyroid surgery.  She is on levothyroxine.  On exam she is well-appearing for her age.  The left leg is neuro/vascular intact and the knee has good range of motion and no obvious deformity.  Remainder of the exam is unremarkable.  We will obtain x-rays of the left knee.  I suspect meniscus or ligamentous injury.  Anticipate discharge home in a knee immobilizer with orthopedic follow-up.  We will obtain labs including TSH and free T4 to evaluate for the fatigue.  ----------------------------------------- 2:21 PM on 01/24/2019 -----------------------------------------  TSH is only minimally elevated.  The other labs are unremarkable.  X-rays of the knee are negative for acute findings.  We have placed the patient in a knee  immobilizer.  We will give her orthopedic referral and I instructed her to follow-up with her PMD in regards to her fatigue.  I discussed the results and return precautions both with the patient and with her daughter Shauna Hugh on the phone and they both expressed understanding.  ____________________________________________   FINAL CLINICAL IMPRESSION(S) / ED DIAGNOSES  Final diagnoses:  Injury of left knee, initial encounter      NEW MEDICATIONS STARTED DURING THIS VISIT:  Discharge Medication List as of 01/24/2019  1:41 PM       Note:  This document was prepared using Dragon voice recognition software and may include unintentional dictation errors.    Arta Silence, MD 01/24/19 1422

## 2019-01-24 NOTE — ED Notes (Signed)
lefy lleg brace placed by er teach.

## 2019-01-24 NOTE — Discharge Instructions (Addendum)
You likely have an injury of the meniscus (cartilage) or ligaments in the knee.  You should follow-up with an orthopedist.  We have provided the names of 2 area orthopedist in the discharge paperwork and you can call to follow-up with either 1 of them.  Wear the knee immobilizer at all times until you follow-up.  You should also follow-up with your primary care doctor.  Your TSH today was 7.3.  Return to the ER for new or worsening fatigue or weakness, shortness of breath, chest pain, vomiting, or any other new or worsening symptoms that concern you.

## 2019-01-24 NOTE — ED Notes (Signed)
xrays completed.

## 2019-01-24 NOTE — ED Triage Notes (Signed)
Says she has been having trouble with left knee, but went to get up last night and it went thewrong way and it is painful.  She is also saying she has been feeling bad recently and is concerned about her thyroid. Says it feels like she did before she went on meds.

## 2019-02-02 ENCOUNTER — Emergency Department: Payer: Medicare Other

## 2019-02-02 ENCOUNTER — Emergency Department
Admission: EM | Admit: 2019-02-02 | Discharge: 2019-02-02 | Disposition: A | Discharge status: Home / Self Care | Payer: Medicare Other | Attending: Emergency Medicine | Admitting: Emergency Medicine

## 2019-02-02 DIAGNOSIS — R410 Disorientation, unspecified: Secondary | ICD-10-CM | POA: Insufficient documentation

## 2019-02-02 DIAGNOSIS — R413 Other amnesia: Secondary | ICD-10-CM | POA: Insufficient documentation

## 2019-02-02 DIAGNOSIS — Z85828 Personal history of other malignant neoplasm of skin: Secondary | ICD-10-CM | POA: Diagnosis not present

## 2019-02-02 DIAGNOSIS — W19XXXA Unspecified fall, initial encounter: Secondary | ICD-10-CM | POA: Diagnosis not present

## 2019-02-02 DIAGNOSIS — Y998 Other external cause status: Secondary | ICD-10-CM | POA: Insufficient documentation

## 2019-02-02 DIAGNOSIS — Z7901 Long term (current) use of anticoagulants: Secondary | ICD-10-CM | POA: Diagnosis not present

## 2019-02-02 DIAGNOSIS — Y939 Activity, unspecified: Secondary | ICD-10-CM | POA: Insufficient documentation

## 2019-02-02 DIAGNOSIS — Y92019 Unspecified place in single-family (private) house as the place of occurrence of the external cause: Secondary | ICD-10-CM | POA: Insufficient documentation

## 2019-02-02 DIAGNOSIS — Z79899 Other long term (current) drug therapy: Secondary | ICD-10-CM | POA: Insufficient documentation

## 2019-02-02 LAB — URINALYSIS, COMPLETE (UACMP) WITH MICROSCOPIC
Bacteria, UA: NONE SEEN
Bilirubin Urine: NEGATIVE
Glucose, UA: NEGATIVE mg/dL
Hgb urine dipstick: NEGATIVE
Ketones, ur: NEGATIVE mg/dL
Leukocytes,Ua: NEGATIVE
Nitrite: NEGATIVE
Protein, ur: NEGATIVE mg/dL
Specific Gravity, Urine: 1.016 (ref 1.005–1.030)
pH: 5 (ref 5.0–8.0)

## 2019-02-02 LAB — COMPREHENSIVE METABOLIC PANEL
ALT: 38 U/L (ref 0–44)
AST: 27 U/L (ref 15–41)
Albumin: 3.9 g/dL (ref 3.5–5.0)
Alkaline Phosphatase: 23 U/L — ABNORMAL LOW (ref 38–126)
Anion gap: 7 (ref 5–15)
BUN: 29 mg/dL — ABNORMAL HIGH (ref 8–23)
CO2: 27 mmol/L (ref 22–32)
Calcium: 9 mg/dL (ref 8.9–10.3)
Chloride: 101 mmol/L (ref 98–111)
Creatinine, Ser: 0.94 mg/dL (ref 0.44–1.00)
GFR calc Af Amer: >60 mL/min (ref >60)
GFR calc non Af Amer: 54 mL/min — ABNORMAL LOW (ref >60)
Glucose, Bld: 106 mg/dL — ABNORMAL HIGH (ref 70–99)
Potassium: 4.3 mmol/L (ref 3.5–5.1)
Sodium: 135 mmol/L (ref 135–145)
Total Bilirubin: 0.7 mg/dL (ref 0.3–1.2)
Total Protein: 6.9 g/dL (ref 6.5–8.1)

## 2019-02-02 LAB — CBC
HCT: 41.6 % (ref 36.0–46.0)
Hemoglobin: 13.3 g/dL (ref 12.0–15.0)
MCH: 29.6 pg (ref 26.0–34.0)
MCHC: 32 g/dL (ref 30.0–36.0)
MCV: 92.4 fL (ref 80.0–100.0)
Platelets: 295 10*3/uL (ref 150–400)
RBC: 4.5 MIL/uL (ref 3.87–5.11)
RDW: 13.6 % (ref 11.5–15.5)
WBC: 10.3 10*3/uL (ref 4.0–10.5)
nRBC: 0 % (ref 0.0–0.2)

## 2019-02-02 NOTE — ED Notes (Signed)
Daughter asking where pt was- informed that pt was having scans done

## 2019-02-02 NOTE — ED Triage Notes (Addendum)
PT to ED via EMS from home. PT had unwitnessed fall at home. Per EMS pt is confused, normally AOx4. PT unknown if LOC. PT does take eliquis. New short term memory loss since fall.

## 2019-02-02 NOTE — ED Provider Notes (Signed)
[facility] Emergency Department Provider Note  Time seen: 12:02 PM  I have reviewed the triage vital signs and the nursing notes.   HISTORY  Chief Complaint fall, head injury  HPI Jenny [name] is a 85 y.o. female with a past medical history of hypertension, short-term memory loss, presents emergency department after a fall.  According to the patient she was at home, does not recall how she fell, this was unwitnessed at home.  Patient is on Eliquis so she was sent to the emergency department for evaluation.  Patient denies any symptoms at this time.  Denies any recent fever cough or congestion.  Denies any headache denies any weakness or numbness.  Patient is answering questions appropriately at this time.  Past Medical History:  Diagnosis Date  . HBP (high blood pressure)   . Reflux   . Skin cancer   . Thyroid disease     Patient Active Problem List   Diagnosis Date Noted  . Avitaminosis D 06/07/2015  . Back ache 11/22/2013  . Artery disease, cerebral 10/26/2013  . Diverticulitis of colon 10/26/2013  . Acid reflux 10/26/2013  . Calcium blood increased 10/26/2013  . HLD (hyperlipidemia) 10/26/2013    Past Surgical History:  Procedure Laterality Date  . BREAST BIOPSY Right   . CARDIOVERSION N/A 04/07/2018   Procedure: CARDIOVERSION;  Surgeon: Teodoro Spray, MD;  Location: [facility] ORS;  Service: Cardiovascular;  Laterality: N/A;  . ESOPHAGOGASTRODUODENOSCOPY (EGD) WITH PROPOFOL N/A 04/26/2015   Procedure: ESOPHAGOGASTRODUODENOSCOPY (EGD) WITH PROPOFOL;  Surgeon: Manya Silvas, MD;  Location: [facility] ENDOSCOPY;  Service: Endoscopy;  Laterality: N/A;  . NECK SURGERY      Prior to Admission medications   Medication Sig Start Date End Date Taking? Authorizing Provider  amiodarone (PACERONE) 200 MG tablet Take 200 mg by mouth daily.    [provider]  apixaban (ELIQUIS) 5 MG TABS tablet Take 5 mg by mouth 2 (two) times daily.    [provider]  Cholecalciferol (VITAMIN D-3 PO) Take by mouth daily.    [provider]  citalopram (CELEXA) 20 MG tablet  09/06/15   [provider]  clopidogrel (PLAVIX) 75 MG tablet  05/07/13   [provider]  diclofenac sodium (VOLTAREN) 1 % GEL Apply 4 g topically 4 (four) times daily. Patient not taking: Reported on 04/07/2018 03/24/16   Tyson Dense T, DPM  diltiazem (DILACOR XR) 120 MG 24 hr capsule Take 120 mg by mouth daily.    [provider]  DULoxetine (CYMBALTA) 30 MG capsule Take 90 mg by mouth daily.    [provider]  MELATONIN PO Take by mouth daily.    [provider]  meloxicam (MOBIC) 15 MG tablet Take 1 tablet (15 mg total) by mouth daily. 10/03/15   Hyatt, Max T, DPM  Multiple Vitamins-Minerals (MULTIVITAMIN PO) Take by mouth daily.    [provider]  NONFORMULARY OR COMPOUNDED ITEM Apply 1-2 g topically 4 (four) times daily. 06/10/16   Edrick Kins, DPM  pantoprazole (PROTONIX) 20 MG tablet  05/07/13   [provider]  simvastatin (ZOCOR) 20 MG tablet  05/07/13   [provider]    Allergies  Allergen Reactions  . Flagyl [Metronidazole] Nausea And Vomiting    History reviewed. No pertinent family history.  Social History Social History   Tobacco Use  . Smoking status: Never Smoker  . Smokeless tobacco: Never Used  Substance Use Topics  . Alcohol use:  No    Alcohol/week: 0.0 standard drinks  . Drug use: No    Review of Systems Constitutional: Negative for fever. Eyes: Negative for visual complaints ENT: Negative for recent illness/congestion Cardiovascular: Negative for chest pain. Respiratory: Negative for shortness of breath. Gastrointestinal: Negative for abdominal pain, vomiting and diarrhea. Genitourinary: Negative for urinary compaints Musculoskeletal: Negative for musculoskeletal complaints Skin: Negative for skin complaints  Neurological: Negative for headache All  other ROS negative  ____________________________________________   PHYSICAL EXAM:  VITAL SIGNS: ED Triage Vitals  Enc Vitals Group     BP 02/02/19 1157 (!) 156/97     Pulse Rate 02/02/19 1154 (!) 56     Resp 02/02/19 1154 (!) 21     Temp --      Temp src --      SpO2 02/02/19 1154 99 %     Weight 02/02/19 1154 185 lb (83.9 kg)     Height 02/02/19 1154 5\' 7"  (1.702 m)     Head Circumference --      Peak Flow --      Pain Score 02/02/19 1154 0     Pain Loc --      Pain Edu? --      Excl. in [place]? --    Constitutional: Patient is awake and alert, no distress. Eyes: Normal exam ENT      Head: Normocephalic and atraumatic.      Mouth/Throat: Mucous membranes are moist. Cardiovascular: Normal rate, regular rhythm.  Respiratory: Normal respiratory effort without tachypnea nor retractions. Breath sounds are clear  Gastrointestinal: Soft and nontender. No distention.  Musculoskeletal: Nontender with normal range of motion in all extremities. Neurologic:  Normal speech and language. No gross focal neurologic deficits  Skin:  Skin is warm, dry and intact.  Psychiatric: Mood and affect are normal.   ____________________________________________    EKG  EKG viewed and interpreted by myself shows a normal sinus rhythm at 56 bpm with a narrow QRS, normal axis, normal intervals, nonspecific ST changes.  ____________________________________________    RADIOLOGY  CT negative for acute abnormality  ____________________________________________   INITIAL IMPRESSION / ASSESSMENT AND PLAN / ED COURSE  Pertinent labs & imaging results that were available during my care of the patient were reviewed by me and considered in my medical decision making (see chart for details).   Patient presents to the emergency department for fall with possible head injury, unwitnessed.  Patient is awake and alert, has short-term memory loss and does not recall what happened.  We will check labs including  blood work and urinalysis, CT scan head as a precaution continue to closely monitor.  Patient agreeable to plan of care.  Remains calm cooperative and pleasant.  Patient's work-up is reassuring.  Blood work and urinalysis are normal.  CT scan of the head is negative.  Daughter is here with the patient.  We will discharge the patient home with PCP follow-up.  Patient agreeable to plan of care.  [place] P Gad was evaluated in Emergency Department on 02/02/2019 for the symptoms described in the history of present illness. She was evaluated in the context of the global COVID-19 pandemic, which necessitated consideration that the patient might be at risk for infection with the SARS-CoV-2 virus that causes COVID-19. Institutional protocols and algorithms that pertain to the evaluation of patients at risk for COVID-19 are in a state of rapid change based on information released by regulatory bodies including the CDC and federal and state organizations. These policies  and algorithms were followed during the patient's care in the ED.  ____________________________________________   FINAL CLINICAL IMPRESSION(S) / ED DIAGNOSES  Cheral Marker, MD 02/02/19 [phone]

## 2019-02-02 NOTE — ED Notes (Signed)
ED Provider at bedside. 

## 2019-02-16 ENCOUNTER — Other Ambulatory Visit: Payer: Self-pay | Admitting: Physician Assistant

## 2019-02-16 DIAGNOSIS — M25362 Other instability, left knee: Secondary | ICD-10-CM

## 2019-03-01 ENCOUNTER — Ambulatory Visit
Admission: RE | Admit: 2019-03-01 | Discharge: 2019-03-01 | Disposition: A | Discharge status: Home / Self Care | Payer: Medicare Other | Source: Ambulatory Visit | Attending: Physician Assistant | Admitting: Physician Assistant

## 2019-03-01 ENCOUNTER — Other Ambulatory Visit: Payer: Self-pay

## 2019-03-01 DIAGNOSIS — M25362 Other instability, left knee: Secondary | ICD-10-CM

## 2019-03-18 ENCOUNTER — Encounter
Admission: RE | Admit: 2019-03-18 | Discharge: 2019-03-18 | Disposition: A | Discharge status: Home / Self Care | Payer: Medicare Other | Source: Ambulatory Visit | Attending: Orthopedic Surgery | Admitting: Orthopedic Surgery

## 2019-03-18 ENCOUNTER — Other Ambulatory Visit: Payer: Self-pay

## 2019-03-18 DIAGNOSIS — Z01812 Encounter for preprocedural laboratory examination: Secondary | ICD-10-CM | POA: Insufficient documentation

## 2019-03-18 DIAGNOSIS — M1712 Unilateral primary osteoarthritis, left knee: Secondary | ICD-10-CM | POA: Insufficient documentation

## 2019-03-18 DIAGNOSIS — M2392 Unspecified internal derangement of left knee: Secondary | ICD-10-CM | POA: Insufficient documentation

## 2019-03-18 DIAGNOSIS — Z20828 Contact with and (suspected) exposure to other viral communicable diseases: Secondary | ICD-10-CM | POA: Insufficient documentation

## 2019-03-18 HISTORY — DX: Cardiac arrhythmia, unspecified: I49.9

## 2019-03-18 HISTORY — DX: Depression, unspecified: F32.A

## 2019-03-18 HISTORY — DX: Gastro-esophageal reflux disease without esophagitis: K21.9

## 2019-03-18 HISTORY — DX: Dyspnea, unspecified: R06.00

## 2019-03-18 NOTE — Pre-Procedure Instructions (Signed)
Called Dr Menz's office for orders. 

## 2019-03-18 NOTE — Patient Instructions (Signed)
Your procedure is scheduled on: 03/22/2019 Tues Report to Same Day Surgery 2nd floor medical mall [facility] Entrance-take elevator on left to 2nd floor.  Check in with surgery information desk.) To find out your arrival time please call [phone] between 1PM - 3PM on 03/21/2019 Mon  Remember: Instructions that are not followed completely may result in serious medical risk, up to and including death, or upon the discretion of your surgeon and anesthesiologist your surgery may need to be rescheduled.    _x___ 1. Do not eat food after midnight the night before your procedure. You may drink clear liquids up to 2 hours before you are scheduled to arrive at the hospital for your procedure.  Do not drink clear liquids within 2 hours of your scheduled arrival to the hospital.  Clear liquids include  --Water or Apple juice without pulp  --Clear carbohydrate beverage such as ClearFast or Gatorade  --Black Coffee or Clear Tea (No milk, no creamers, do not add anything to                  the coffee or Tea Type 1 and type 2 diabetics should only drink water.   ____Ensure clear carbohydrate drink on the way to the hospital for bariatric patients  ____Ensure clear carbohydrate drink 3 hours before surgery.   No gum chewing or hard candies.     __x__ 2. No Alcohol for 24 hours before or after surgery.   __x__3. No Smoking or e-cigarettes for 24 prior to surgery.  Do not use any chewable tobacco products for at least 6 hour prior to surgery   ____  4. Bring all medications with you on the day of surgery if instructed.    __x__ 5. Notify your doctor if there is any change in your medical condition     (cold, fever, infections).    x___6. On the morning of surgery brush your teeth with toothpaste and water.  You may rinse your mouth with mouth wash if you wish.  Do not swallow any toothpaste or mouthwash.   Do not wear jewelry, make-up, hairpins, clips or nail polish.  Do not wear lotions,  powders, or perfumes. You may wear deodorant.  Do not shave 48 hours prior to surgery. Men may shave face and neck.  Do not bring valuables to the hospital.    [facility] is not responsible for any belongings or valuables.               Contacts, dentures or bridgework may not be worn into surgery.  Leave your suitcase in the car. After surgery it may be brought to your room.  For patients admitted to the hospital, discharge time is determined by your                       treatment team.  _  Patients discharged the day of surgery will not be allowed to drive home.  You will need someone to drive you home and stay with you the night of your procedure.    Please read over the following fact sheets that you were given:   [facility] Preparing for Surgery and or MRSA Information   _x___ Take anti-hypertensive listed below, cardiac, seizure, asthma,     anti-reflux and psychiatric medicines. These include:  1.DULoxetine (CYMBALTA) 30 MG capsule  2.pantoprazole (PROTONIX) 20 MG tablet  3.  4.  5.  6.  ____Fleets enema or Magnesium Citrate as directed.  _x___ Use CHG Soap or sage wipes as directed on instruction sheet   ____ Use inhalers on the day of surgery and bring to hospital day of surgery  ____ Stop Metformin and Janumet 2 days prior to surgery.    ____ Take 1/2 of usual insulin dose the night before surgery and none on the morning     surgery.   _x___ Follow recommendations from Cardiologist, Pulmonologist or PCP regarding          stopping Aspirin, Coumadin, Plavix ,Eliquis, Effient, or Pradaxa, and Pletal.  X____Stop Anti-inflammatories such as Advil, Aleve, Ibuprofen, Motrin, Naproxen, Naprosyn, Goodies powders or aspirin products. OK to take Tylenol and                          Celebrex.   _x___ Stop supplements until after surgery.  But may continue Vitamin D, Vitamin B,       and multivitamin.   ____ Bring C-Pap to the hospital.

## 2019-03-19 LAB — SARS CORONAVIRUS 2 (TAT 6-24 HRS): SARS Coronavirus 2: NEGATIVE

## 2019-03-22 ENCOUNTER — Encounter
Admission: RE | Disposition: A | Discharge status: Home / Self Care | Payer: Self-pay | Source: Home / Self Care | Attending: Orthopedic Surgery

## 2019-03-22 ENCOUNTER — Ambulatory Visit: Payer: Medicare Other | Admitting: Anesthesiology

## 2019-03-22 ENCOUNTER — Ambulatory Visit
Admission: RE | Admit: 2019-03-22 | Discharge: 2019-03-22 | Disposition: A | Discharge status: Home / Self Care | Payer: Medicare Other | Attending: Orthopedic Surgery | Admitting: Orthopedic Surgery

## 2019-03-22 ENCOUNTER — Encounter: Payer: Self-pay | Admitting: *Deleted

## 2019-03-22 ENCOUNTER — Other Ambulatory Visit: Payer: Self-pay

## 2019-03-22 DIAGNOSIS — Z79899 Other long term (current) drug therapy: Secondary | ICD-10-CM | POA: Insufficient documentation

## 2019-03-22 DIAGNOSIS — M1712 Unilateral primary osteoarthritis, left knee: Secondary | ICD-10-CM | POA: Diagnosis not present

## 2019-03-22 DIAGNOSIS — I4891 Unspecified atrial fibrillation: Secondary | ICD-10-CM | POA: Diagnosis not present

## 2019-03-22 DIAGNOSIS — K219 Gastro-esophageal reflux disease without esophagitis: Secondary | ICD-10-CM | POA: Diagnosis not present

## 2019-03-22 DIAGNOSIS — M2392 Unspecified internal derangement of left knee: Secondary | ICD-10-CM | POA: Diagnosis present

## 2019-03-22 DIAGNOSIS — X58XXXA Exposure to other specified factors, initial encounter: Secondary | ICD-10-CM | POA: Insufficient documentation

## 2019-03-22 DIAGNOSIS — I10 Essential (primary) hypertension: Secondary | ICD-10-CM | POA: Insufficient documentation

## 2019-03-22 DIAGNOSIS — S83242A Other tear of medial meniscus, current injury, left knee, initial encounter: Secondary | ICD-10-CM | POA: Diagnosis not present

## 2019-03-22 DIAGNOSIS — M11262 Other chondrocalcinosis, left knee: Secondary | ICD-10-CM | POA: Insufficient documentation

## 2019-03-22 DIAGNOSIS — Z79890 Hormone replacement therapy (postmenopausal): Secondary | ICD-10-CM | POA: Insufficient documentation

## 2019-03-22 DIAGNOSIS — Z7901 Long term (current) use of anticoagulants: Secondary | ICD-10-CM | POA: Insufficient documentation

## 2019-03-22 DIAGNOSIS — S83282A Other tear of lateral meniscus, current injury, left knee, initial encounter: Secondary | ICD-10-CM | POA: Insufficient documentation

## 2019-03-22 DIAGNOSIS — F329 Major depressive disorder, single episode, unspecified: Secondary | ICD-10-CM | POA: Diagnosis not present

## 2019-03-22 HISTORY — PX: KNEE ARTHROSCOPY WITH LATERAL MENISECTOMY: SHX6193

## 2019-03-22 SURGERY — ARTHROSCOPY, KNEE, WITH LATERAL MENISCECTOMY
Anesthesia: General | Site: Knee | Laterality: Left

## 2019-03-22 MED ORDER — DEXAMETHASONE SODIUM PHOSPHATE 10 MG/ML IJ SOLN
INTRAMUSCULAR | Status: DC | PRN
  Administered 2019-03-22: 10 mg via INTRAVENOUS

## 2019-03-22 MED ORDER — ONDANSETRON HCL 4 MG/2ML IJ SOLN: 4.0000 mg | Freq: Four times a day (QID) | INTRAMUSCULAR | Status: DC | PRN

## 2019-03-22 MED ORDER — ONDANSETRON HCL 4 MG/2ML IJ SOLN: 4.0000 mg | Freq: Once | INTRAMUSCULAR | Status: DC | PRN

## 2019-03-22 MED ORDER — LIDOCAINE HCL (PF) 2 % IJ SOLN
INTRAMUSCULAR | Status: AC
  Filled 2019-03-22: qty 10

## 2019-03-22 MED ORDER — PROPOFOL 10 MG/ML IV BOLUS
INTRAVENOUS | Status: AC
  Filled 2019-03-22: qty 20

## 2019-03-22 MED ORDER — EPHEDRINE SULFATE 50 MG/ML IJ SOLN
INTRAMUSCULAR | Status: DC | PRN
  Administered 2019-03-22 (×2): 15 mg via INTRAVENOUS

## 2019-03-22 MED ORDER — CEFAZOLIN SODIUM-DEXTROSE 2-4 GM/100ML-% IV SOLN
INTRAVENOUS | Status: AC
  Filled 2019-03-22: qty 100

## 2019-03-22 MED ORDER — HYDROCODONE-ACETAMINOPHEN 5-325 MG PO TABS: 1.0000 | ORAL_TABLET | ORAL | Status: DC | PRN

## 2019-03-22 MED ORDER — DEXMEDETOMIDINE HCL 200 MCG/2ML IV SOLN
INTRAVENOUS | Status: DC | PRN
  Administered 2019-03-22: 8 ug via INTRAVENOUS

## 2019-03-22 MED ORDER — LIDOCAINE HCL (CARDIAC) PF 100 MG/5ML IV SOSY
PREFILLED_SYRINGE | INTRAVENOUS | Status: DC | PRN
  Administered 2019-03-22: 100 mg via INTRAVENOUS

## 2019-03-22 MED ORDER — MORPHINE SULFATE (PF) 4 MG/ML IV SOLN: 0.5000 mg | INTRAVENOUS | Status: DC | PRN

## 2019-03-22 MED ORDER — BUPIVACAINE-EPINEPHRINE (PF) 0.5% -1:200000 IJ SOLN
INTRAMUSCULAR | Status: DC | PRN
  Administered 2019-03-22: 20 mL via PERINEURAL

## 2019-03-22 MED ORDER — VASOPRESSIN 20 UNIT/ML IV SOLN
INTRAVENOUS | Status: DC | PRN
  Administered 2019-03-22: 1 [IU] via INTRAVENOUS

## 2019-03-22 MED ORDER — METOCLOPRAMIDE HCL 10 MG PO TABS: 5.0000 mg | ORAL_TABLET | Freq: Three times a day (TID) | ORAL | Status: DC | PRN

## 2019-03-22 MED ORDER — ACETAMINOPHEN 500 MG PO TABS: 500.0000 mg | ORAL_TABLET | Freq: Four times a day (QID) | ORAL | Status: DC

## 2019-03-22 MED ORDER — ACETAMINOPHEN 325 MG PO TABS: 325.0000 mg | ORAL_TABLET | Freq: Four times a day (QID) | ORAL | Status: DC | PRN

## 2019-03-22 MED ORDER — FENTANYL CITRATE (PF) 100 MCG/2ML IJ SOLN
INTRAMUSCULAR | Status: AC
  Filled 2019-03-22: qty 2

## 2019-03-22 MED ORDER — METOCLOPRAMIDE HCL 5 MG/ML IJ SOLN: 5.0000 mg | Freq: Three times a day (TID) | INTRAMUSCULAR | Status: DC | PRN

## 2019-03-22 MED ORDER — ACETAMINOPHEN 10 MG/ML IV SOLN
INTRAVENOUS | Status: DC | PRN
  Administered 2019-03-22: 1000 mg via INTRAVENOUS

## 2019-03-22 MED ORDER — HYDROCODONE-ACETAMINOPHEN 7.5-325 MG PO TABS
1.0000 | ORAL_TABLET | ORAL | Status: DC | PRN
  Filled 2019-03-22: qty 2

## 2019-03-22 MED ORDER — FENTANYL CITRATE (PF) 100 MCG/2ML IJ SOLN: 25.0000 ug | INTRAMUSCULAR | Status: DC | PRN

## 2019-03-22 MED ORDER — PROPOFOL 10 MG/ML IV BOLUS
INTRAVENOUS | Status: DC | PRN
  Administered 2019-03-22: 120 mg via INTRAVENOUS

## 2019-03-22 MED ORDER — MIDAZOLAM HCL 2 MG/2ML IJ SOLN
INTRAMUSCULAR | Status: AC
  Filled 2019-03-22: qty 2

## 2019-03-22 MED ORDER — FENTANYL CITRATE (PF) 100 MCG/2ML IJ SOLN
INTRAMUSCULAR | Status: DC | PRN
  Administered 2019-03-22: 50 ug via INTRAVENOUS

## 2019-03-22 MED ORDER — HYDROCODONE-ACETAMINOPHEN 5-325 MG PO TABS: 1.0000 | ORAL_TABLET | Freq: Four times a day (QID) | ORAL | 0 refills | Status: DC | PRN

## 2019-03-22 MED ORDER — ONDANSETRON HCL 4 MG PO TABS: 4.0000 mg | ORAL_TABLET | Freq: Four times a day (QID) | ORAL | Status: DC | PRN

## 2019-03-22 MED ORDER — CEFAZOLIN SODIUM-DEXTROSE 2-4 GM/100ML-% IV SOLN
2.0000 g | Freq: Once | INTRAVENOUS | Status: AC
  Administered 2019-03-22: 2 g via INTRAVENOUS

## 2019-03-22 MED ORDER — LACTATED RINGERS IV SOLN: INTRAVENOUS | Status: DC

## 2019-03-22 MED ORDER — SODIUM CHLORIDE 0.9 % IV SOLN: INTRAVENOUS | Status: DC

## 2019-03-22 MED ORDER — ONDANSETRON HCL 4 MG/2ML IJ SOLN
INTRAMUSCULAR | Status: DC | PRN
  Administered 2019-03-22: 4 mg via INTRAVENOUS

## 2019-03-22 MED ORDER — LACTATED RINGERS IV SOLN
INTRAVENOUS | Status: DC | PRN
  Administered 2019-03-22: 10:00:00 via INTRAVENOUS

## 2019-03-22 SURGICAL SUPPLY — 30 items
ADAPTER IRRIG TUBE 2 SPIKE SOL (ADAPTER) ×2 IMPLANT
BLADE INCISOR PLUS 4.5 (BLADE) IMPLANT
BNDG ELASTIC 4X5.8 VLCR STR LF (GAUZE/BANDAGES/DRESSINGS) ×2 IMPLANT
CHLORAPREP W/TINT 26 (MISCELLANEOUS) ×3 IMPLANT
COVER WAND RF STERILE (DRAPES) ×3 IMPLANT
CUFF TOURN SGL QUICK 24 (TOURNIQUET CUFF)
CUFF TOURN SGL QUICK 30 (TOURNIQUET CUFF)
CUFF TRNQT CYL 24X4X16.5-23 (TOURNIQUET CUFF) IMPLANT
CUFF TRNQT CYL 30X4X21-28X (TOURNIQUET CUFF) IMPLANT
GAUZE SPONGE 4X4 12PLY STRL (GAUZE/BANDAGES/DRESSINGS) ×3 IMPLANT
GLOVE SURG SYN 9.0  PF PI (GLOVE) ×2
GLOVE SURG SYN 9.0 PF PI (GLOVE) ×1 IMPLANT
GOWN SRG 2XL LVL 4 RGLN SLV (GOWNS) ×1 IMPLANT
GOWN STRL NON-REIN 2XL LVL4 (GOWNS) ×2
GOWN STRL REUS W/ TWL LRG LVL3 (GOWN DISPOSABLE) ×2 IMPLANT
GOWN STRL REUS W/TWL LRG LVL3 (GOWN DISPOSABLE) ×4
IV LACTATED RINGER IRRG 3000ML (IV SOLUTION) ×4
IV LR IRRIG 3000ML ARTHROMATIC (IV SOLUTION) ×2 IMPLANT
KIT TURNOVER KIT A (KITS) ×3 IMPLANT
MANIFOLD NEPTUNE II (INSTRUMENTS) ×3 IMPLANT
NEEDLE HYPO 22GX1.5 SAFETY (NEEDLE) ×3 IMPLANT
PACK ARTHROSCOPY KNEE (MISCELLANEOUS) ×3 IMPLANT
SCALPEL PROTECTED #11 DISP (BLADE) ×3 IMPLANT
SET TUBE SUCT SHAVER OUTFL 24K (TUBING) ×3 IMPLANT
SET TUBE TIP INTRA-ARTICULAR (MISCELLANEOUS) ×3 IMPLANT
SUT ETHILON 4-0 (SUTURE) ×2
SUT ETHILON 4-0 FS2 18XMFL BLK (SUTURE) ×1
SUTURE ETHLN 4-0 FS2 18XMF BLK (SUTURE) ×1 IMPLANT
TUBING ARTHRO INFLOW-ONLY STRL (TUBING) ×3 IMPLANT
WAND COBLATION FLOW 50 (SURGICAL WAND) ×3 IMPLANT

## 2019-03-22 NOTE — Anesthesia Preprocedure Evaluation (Signed)
Anesthesia Evaluation  Patient identified by MRN, date of birth, ID band Patient awake    Reviewed: Allergy & Precautions, H&P , NPO status , Patient's Chart, lab work & pertinent test results  History of Anesthesia Complications Negative for: history of anesthetic complications  Airway Mallampati: II  TM Distance: >3 FB Neck ROM: limited    Dental  (+) Poor Dentition, Chipped, Dental Advidsory Given   Pulmonary shortness of breath (secondary to a fib), neg COPD, neg recent URI,           Cardiovascular Exercise Tolerance: Good hypertension, (-) angina(-) CAD, (-) Past MI, (-) Cardiac Stents, (-) CABG and (-) DOE + dysrhythmias Atrial Fibrillation (-) Valvular Problems/Murmurs     Neuro/Psych PSYCHIATRIC DISORDERS Depression negative neurological ROS     GI/Hepatic Neg liver ROS, GERD  Controlled,  Endo/Other  negative endocrine ROS  Renal/GU negative Renal ROS  negative genitourinary   Musculoskeletal   Abdominal   Peds  Hematology negative hematology ROS (+)   Anesthesia Other Findings Past Medical History:   Reflux                                                       Skin cancer                                                  HBP (high blood pressure)                                   Past Surgical History:   NECK SURGERY                                                  BREAST BIOPSY                                   Right             BMI    Body Mass Index   27.40 kg/m 2      Reproductive/Obstetrics negative OB ROS                             Anesthesia Physical  Anesthesia Plan  ASA: III  Anesthesia Plan: General   Post-op Pain Management:    Induction: Intravenous  PONV Risk Score and Plan: 3 and Ondansetron, Dexamethasone and Treatment may vary due to age or medical condition  Airway Management Planned: LMA  Additional Equipment:   Intra-op Plan:    Post-operative Plan: Extubation in OR  Informed Consent: I have reviewed the patients History and Physical, chart, labs and discussed the procedure including the risks, benefits and alternatives for the proposed anesthesia with the patient or authorized representative who has indicated his/her understanding and acceptance.     Dental Advisory Given  Plan Discussed with: Anesthesiologist, CRNA and Surgeon  Anesthesia Plan Comments:  Anesthesia Quick Evaluation  

## 2019-03-22 NOTE — Transfer of Care (Signed)
Immediate Anesthesia Transfer of Care Note  Patient: Jenny [name]  Procedure(s) Performed: KNEE ARTHROSCOPY WITH PARTIAL, MEDIAL AND LATERAL MENISECTOMY (Left Knee)  Patient Location: PACU  Anesthesia Type:General  Level of Consciousness: sedated  Airway & Oxygen Therapy: Patient Spontanous Breathing and Patient connected to face mask oxygen  Post-op Assessment: VSS  Post vital signs: Reviewed and stable  Last Vitals:  Vitals Value Taken Time  BP 172/67 03/22/19 1057  Temp 36.4 C 03/22/19 1057  Pulse 63 03/22/19 1103  Resp 18 03/22/19 1103  SpO2 100 % 03/22/19 1103  Vitals shown include unvalidated device data.  Last Pain:  Vitals:   03/22/19 1057  TempSrc:   PainSc: Asleep         Complications: No apparent anesthesia complications

## 2019-03-22 NOTE — Anesthesia Procedure Notes (Signed)
Procedure Name: LMA Insertion Date/Time: 03/22/2019 9:58 AM Performed by: Justus Memory, CRNA Pre-anesthesia Checklist: Patient identified, Patient being monitored, Timeout performed, Emergency Drugs available and Suction available Patient Re-evaluated:Patient Re-evaluated prior to induction Oxygen Delivery Method: Circle system utilized Preoxygenation: Pre-oxygenation with 100% oxygen Induction Type: IV induction Ventilation: Mask ventilation without difficulty LMA: LMA inserted LMA Size: 3.5 Tube type: Oral Number of attempts: 1 Placement Confirmation: positive ETCO2 and breath sounds checked- equal and bilateral Tube secured with: Tape Dental Injury: Teeth and Oropharynx as per pre-operative assessment

## 2019-03-22 NOTE — Op Note (Signed)
03/22/2019  10:53 AM  PATIENT:  Jenny [name]  85 y.o. female  PRE-OPERATIVE DIAGNOSIS:  PRIMARY OSTEOARTHRITIS OF LEFT KNEE.  INTERNAL DERANGEMENT OF LEFT KNEE INVOLVING POSTERIOR HORN OF LATERAL MENISCUS.  POST-OPERATIVE DIAGNOSIS:  PRIMARY OSTEOARTHRITIS OF LEFT KNEE.  INTERNAL DERANGEMENT OF LEFT KNEE INVOLVING POSTERIOR HORN OF LATERAL MENISCUS.  Medial meniscus tear  PROCEDURE:  Procedure(s): KNEE ARTHROSCOPY WITH PARTIAL, MEDIAL AND LATERAL MENISECTOMY (Left)  SURGEON: Laurene Footman, MD  ASSISTANTS: None  ANESTHESIA:   general  EBL:  Total I/O In: 400 [I.V.:400] Out: 3 [Blood:3]  BLOOD ADMINISTERED:none  DRAINS: none   LOCAL MEDICATIONS USED:  MARCAINE     SPECIMEN:  No Specimen  DISPOSITION OF SPECIMEN:  N/A  COUNTS:  YES  TOURNIQUET:  * Missing tourniquet times found for documented tourniquets in log: [id number] *  IMPLANTS: None  DICTATION: .Dragon Dictation patient was brought to the operating room and after adequate general anesthesia was obtained the leg was left leg was placed in the arthroscopic leg holder with a tourniquet applied but not utilized during the procedure with arthroscopic leg holder holding the position just above the knee.  The knee was then prepped and draped in the usual sterile fashion and appropriate patient identification and timeout procedure carried out.  An inferior lateral portal was made and the arthroscope was introduced showing extensive chondrocalcinosis in the soft tissues as well as affecting his patellofemoral joint, on medially there is extensive fat pad hypertrophy and inferior medial portal made and a flap tear of the anterior horn of the medial meniscus was identified that displaced into the joint this was debrided with a meniscal shaver there is also significant degenerative change with chondrocalcinosis noted going to the notch the ACL was intact going the lateral compartment there is a large flap tear of the anterior portal  portion of the lateral meniscus that displaced into the joint was debrided with a meniscal shaver as well as the tear noted on the MRI at the junction of the posterior middle thirds which was debrided with a shaver as well as ArthroCare wand.  Some of the fat pad was ablated with the ArthroCare wand for better visualization as well.  The gutters were free of any loose bodies and after thorough irrigation the joint on instrumentation was withdrawn.  The wounds were closed with simple erupted 4-0 nylon and 20 cc of half percent Sensorcaine infiltrated to the portals.  Xeroform 4 x 4 web roll and Ace wrap applied  PLAN OF CARE: Discharge to home after PACU  PATIENT DISPOSITION:  PACU - hemodynamically stable.

## 2019-03-22 NOTE — H&P (Signed)
Reviewed paper H+P, will be scanned into chart. No changes noted.  

## 2019-03-22 NOTE — Discharge Instructions (Addendum)
AMBULATORY SURGERY  DISCHARGE INSTRUCTIONS   1) The drugs that you were given will stay in your system until tomorrow so for the next 24 hours you should not:  A) Drive an automobile B) Make any legal decisions C) Drink any alcoholic beverage   2) You may resume regular meals tomorrow.  Today it is better to start with liquids and gradually work up to solid foods.  You may eat anything you prefer, but it is better to start with liquids, then soup and crackers, and gradually work up to solid foods.   3) Please notify your doctor immediately if you have any unusual bleeding, trouble breathing, redness and pain at the surgery site, drainage, fever, or pain not relieved by medication.    4) Additional Instructions:        Please contact your physician with any problems or Same Day Surgery at [phone], Monday through Friday 6 am to 4 pm, or [place] [facility] number at [phone].Leave dressing in place.  Try to minimize activity for the next 3 days until recheck.  Resume all normal medications and take pain medicine as directed.

## 2019-03-22 NOTE — Anesthesia Post-op Follow-up Note (Signed)
Anesthesia QCDR form completed.        

## 2019-03-22 NOTE — TOC Progression Note (Signed)
Transition of Care [facility]) - Progression Note    Patient Details  Name: Jenny [name] MRN: [id number] Date of Birth: Dec 20, 1927  Transition of Care [facility]) CM/SW Palmer, RN Phone Number: 03/22/2019, 10:25 AM  Clinical Narrative:     Requested the price of Lovenox, will notify the patient once obtained       Expected Discharge Plan and Services                                                 Social Determinants of Health (SDOH) Interventions    Readmission Risk Interventions No flowsheet data found.

## 2019-03-22 NOTE — Anesthesia Postprocedure Evaluation (Signed)
Anesthesia Post Note  Patient: [employer]  Procedure(s) Performed: KNEE ARTHROSCOPY WITH PARTIAL, MEDIAL AND LATERAL MENISECTOMY (Left Knee)  Patient location during evaluation: PACU Anesthesia Type: General Level of consciousness: awake and alert Pain management: pain level controlled Vital Signs Assessment: post-procedure vital signs reviewed and stable Respiratory status: spontaneous breathing, nonlabored ventilation, respiratory function stable and patient connected to nasal cannula oxygen Cardiovascular status: blood pressure returned to baseline and stable Postop Assessment: no apparent nausea or vomiting Anesthetic complications: no     Last Vitals:  Vitals:   03/22/19 1142 03/22/19 1156  BP: (!) 151/67 (!) 163/63  Pulse: (!) 58 63  Resp: 17 16  Temp: 36.5 C (!) 36.4 C  SpO2: 94% 95%    Last Pain:  Vitals:   03/22/19 1156  TempSrc: Temporal  PainSc: 0-No pain                 Martha Clan

## 2019-03-22 NOTE — TOC Benefit Eligibility Note (Signed)
Transition of Care [facility]) Benefit Eligibility Note    Patient Details  Name: Jenny [name] MRN: [id number] Date of Birth: 1927/12/23   Medication/Dose: Enoxaparin 40mg  once daily for 14 days  Covered?: Yes  Tier: Other(Tier 4)  Prescription Coverage Preferred Pharmacy: [facility], [facility], [facility] preferred  Spoke with Person/Company/Phone Number:: Dori with [employer] at [phone]  Co-Pay: $25.14 estimated copay  Prior Approval: No  Deductible: (In stage coverage gap per rep.)    Dannette Barbara Phone Number: [phone] or [phone] 03/22/2019, 1:24 PM

## 2019-03-23 ENCOUNTER — Encounter: Payer: Self-pay | Admitting: Orthopedic Surgery

## 2019-06-29 ENCOUNTER — Ambulatory Visit: Payer: Medicare Other | Attending: Internal Medicine

## 2019-06-29 DIAGNOSIS — Z20822 Contact with and (suspected) exposure to covid-19: Secondary | ICD-10-CM

## 2019-07-01 LAB — NOVEL CORONAVIRUS, NAA: SARS-CoV-2, NAA: NOT DETECTED

## 2020-03-24 ENCOUNTER — Other Ambulatory Visit: Payer: Self-pay

## 2020-03-24 ENCOUNTER — Emergency Department: Payer: Medicare Other

## 2020-03-24 ENCOUNTER — Inpatient Hospital Stay
Admission: EM | Admit: 2020-03-24 | Discharge: 2020-03-27 | DRG: 070 | Disposition: A | Discharge status: Home Health | Payer: Medicare Other | Attending: Internal Medicine | Admitting: Internal Medicine

## 2020-03-24 DIAGNOSIS — E213 Hyperparathyroidism, unspecified: Secondary | ICD-10-CM | POA: Diagnosis present

## 2020-03-24 DIAGNOSIS — Z8673 Personal history of transient ischemic attack (TIA), and cerebral infarction without residual deficits: Secondary | ICD-10-CM | POA: Diagnosis not present

## 2020-03-24 DIAGNOSIS — I482 Chronic atrial fibrillation, unspecified: Secondary | ICD-10-CM | POA: Diagnosis not present

## 2020-03-24 DIAGNOSIS — R531 Weakness: Secondary | ICD-10-CM | POA: Diagnosis present

## 2020-03-24 DIAGNOSIS — I480 Paroxysmal atrial fibrillation: Secondary | ICD-10-CM | POA: Insufficient documentation

## 2020-03-24 DIAGNOSIS — E039 Hypothyroidism, unspecified: Secondary | ICD-10-CM | POA: Diagnosis present

## 2020-03-24 DIAGNOSIS — R0989 Other specified symptoms and signs involving the circulatory and respiratory systems: Secondary | ICD-10-CM | POA: Diagnosis present

## 2020-03-24 DIAGNOSIS — Z79890 Hormone replacement therapy (postmenopausal): Secondary | ICD-10-CM

## 2020-03-24 DIAGNOSIS — Z66 Do not resuscitate: Secondary | ICD-10-CM | POA: Diagnosis present

## 2020-03-24 DIAGNOSIS — I10 Essential (primary) hypertension: Secondary | ICD-10-CM | POA: Diagnosis present

## 2020-03-24 DIAGNOSIS — I6340 Cerebral infarction due to embolism of unspecified cerebral artery: Secondary | ICD-10-CM | POA: Diagnosis not present

## 2020-03-24 DIAGNOSIS — I63423 Cerebral infarction due to embolism of bilateral anterior cerebral arteries: Secondary | ICD-10-CM | POA: Diagnosis not present

## 2020-03-24 DIAGNOSIS — Z888 Allergy status to other drugs, medicaments and biological substances status: Secondary | ICD-10-CM | POA: Diagnosis not present

## 2020-03-24 DIAGNOSIS — Z7901 Long term (current) use of anticoagulants: Secondary | ICD-10-CM

## 2020-03-24 DIAGNOSIS — F329 Major depressive disorder, single episode, unspecified: Secondary | ICD-10-CM | POA: Diagnosis present

## 2020-03-24 DIAGNOSIS — E785 Hyperlipidemia, unspecified: Secondary | ICD-10-CM | POA: Diagnosis present

## 2020-03-24 DIAGNOSIS — R4182 Altered mental status, unspecified: Secondary | ICD-10-CM | POA: Diagnosis present

## 2020-03-24 DIAGNOSIS — Z20822 Contact with and (suspected) exposure to covid-19: Secondary | ICD-10-CM | POA: Diagnosis present

## 2020-03-24 DIAGNOSIS — Y92009 Unspecified place in unspecified non-institutional (private) residence as the place of occurrence of the external cause: Secondary | ICD-10-CM

## 2020-03-24 DIAGNOSIS — I959 Hypotension, unspecified: Secondary | ICD-10-CM | POA: Diagnosis not present

## 2020-03-24 DIAGNOSIS — D62 Acute posthemorrhagic anemia: Secondary | ICD-10-CM | POA: Diagnosis present

## 2020-03-24 DIAGNOSIS — Z85828 Personal history of other malignant neoplasm of skin: Secondary | ICD-10-CM

## 2020-03-24 DIAGNOSIS — H9190 Unspecified hearing loss, unspecified ear: Secondary | ICD-10-CM | POA: Diagnosis present

## 2020-03-24 DIAGNOSIS — R040 Epistaxis: Secondary | ICD-10-CM | POA: Diagnosis present

## 2020-03-24 DIAGNOSIS — R001 Bradycardia, unspecified: Secondary | ICD-10-CM | POA: Diagnosis present

## 2020-03-24 DIAGNOSIS — I4811 Longstanding persistent atrial fibrillation: Secondary | ICD-10-CM | POA: Diagnosis present

## 2020-03-24 DIAGNOSIS — W19XXXA Unspecified fall, initial encounter: Secondary | ICD-10-CM

## 2020-03-24 DIAGNOSIS — G9341 Metabolic encephalopathy: Secondary | ICD-10-CM | POA: Diagnosis present

## 2020-03-24 DIAGNOSIS — K219 Gastro-esophageal reflux disease without esophagitis: Secondary | ICD-10-CM | POA: Diagnosis present

## 2020-03-24 DIAGNOSIS — F0390 Unspecified dementia without behavioral disturbance: Secondary | ICD-10-CM

## 2020-03-24 DIAGNOSIS — Z79899 Other long term (current) drug therapy: Secondary | ICD-10-CM | POA: Diagnosis not present

## 2020-03-24 DIAGNOSIS — I639 Cerebral infarction, unspecified: Secondary | ICD-10-CM | POA: Diagnosis not present

## 2020-03-24 LAB — COMPREHENSIVE METABOLIC PANEL
ALT: 19 U/L (ref 0–44)
AST: 22 U/L (ref 15–41)
Albumin: 3.6 g/dL (ref 3.5–5.0)
Alkaline Phosphatase: 25 U/L — ABNORMAL LOW (ref 38–126)
Anion gap: 6 (ref 5–15)
BUN: 25 mg/dL — ABNORMAL HIGH (ref 8–23)
CO2: 28 mmol/L (ref 22–32)
Calcium: 8.6 mg/dL — ABNORMAL LOW (ref 8.9–10.3)
Chloride: 102 mmol/L (ref 98–111)
Creatinine, Ser: 1.02 mg/dL — ABNORMAL HIGH (ref 0.44–1.00)
GFR calc Af Amer: 56 mL/min — ABNORMAL LOW (ref >60)
GFR calc non Af Amer: 49 mL/min — ABNORMAL LOW (ref >60)
Glucose, Bld: 153 mg/dL — ABNORMAL HIGH (ref 70–99)
Potassium: 4.5 mmol/L (ref 3.5–5.1)
Sodium: 136 mmol/L (ref 135–145)
Total Bilirubin: 0.5 mg/dL (ref 0.3–1.2)
Total Protein: 6.6 g/dL (ref 6.5–8.1)

## 2020-03-24 LAB — URINALYSIS, COMPLETE (UACMP) WITH MICROSCOPIC
Bacteria, UA: NONE SEEN
Bilirubin Urine: NEGATIVE
Glucose, UA: NEGATIVE mg/dL
Ketones, ur: NEGATIVE mg/dL
Leukocytes,Ua: NEGATIVE
Nitrite: NEGATIVE
Protein, ur: 30 mg/dL — AB
RBC / HPF: >50 RBC/hpf — ABNORMAL HIGH (ref 0–5)
Specific Gravity, Urine: 1.02 (ref 1.005–1.030)
pH: 5 (ref 5.0–8.0)

## 2020-03-24 LAB — CBC WITH DIFFERENTIAL/PLATELET
Abs Immature Granulocytes: 0.05 10*3/uL (ref 0.00–0.07)
Basophils Absolute: 0.1 10*3/uL (ref 0.0–0.1)
Basophils Relative: 0 %
Eosinophils Absolute: 0.2 10*3/uL (ref 0.0–0.5)
Eosinophils Relative: 2 %
HCT: 36.3 % (ref 36.0–46.0)
Hemoglobin: 11.6 g/dL — ABNORMAL LOW (ref 12.0–15.0)
Immature Granulocytes: 0 %
Lymphocytes Relative: 19 %
Lymphs Abs: 2.3 10*3/uL (ref 0.7–4.0)
MCH: 29.3 pg (ref 26.0–34.0)
MCHC: 32 g/dL (ref 30.0–36.0)
MCV: 91.7 fL (ref 80.0–100.0)
Monocytes Absolute: 0.9 10*3/uL (ref 0.1–1.0)
Monocytes Relative: 8 %
Neutro Abs: 8.6 10*3/uL — ABNORMAL HIGH (ref 1.7–7.7)
Neutrophils Relative %: 71 %
Platelets: 269 10*3/uL (ref 150–400)
RBC: 3.96 MIL/uL (ref 3.87–5.11)
RDW: 14 % (ref 11.5–15.5)
WBC: 12.1 10*3/uL — ABNORMAL HIGH (ref 4.0–10.5)
nRBC: 0 % (ref 0.0–0.2)

## 2020-03-24 LAB — TROPONIN I (HIGH SENSITIVITY)
Troponin I (High Sensitivity): 10 ng/L (ref <18)
Troponin I (High Sensitivity): 9 ng/L (ref <18)

## 2020-03-24 LAB — CK: Total CK: 133 U/L (ref 38–234)

## 2020-03-24 LAB — TSH: TSH: 4.453 u[IU]/mL (ref 0.350–4.500)

## 2020-03-24 LAB — GLUCOSE, CAPILLARY: Glucose-Capillary: 143 mg/dL — ABNORMAL HIGH (ref 70–99)

## 2020-03-24 LAB — LACTIC ACID, PLASMA: Lactic Acid, Venous: 1.4 mmol/L (ref 0.5–1.9)

## 2020-03-24 LAB — SARS CORONAVIRUS 2 BY RT PCR (HOSPITAL ORDER, PERFORMED IN ~~LOC~~ HOSPITAL LAB): SARS Coronavirus 2: NEGATIVE

## 2020-03-24 LAB — HEMOGLOBIN: Hemoglobin: 10.5 g/dL — ABNORMAL LOW (ref 12.0–15.0)

## 2020-03-24 MED ORDER — ADULT MULTIVITAMIN LIQUID CH
Freq: Every day | ORAL | Status: DC
  Filled 2020-03-24: qty 15

## 2020-03-24 MED ORDER — VITAMIN D 25 MCG (1000 UNIT) PO TABS
1000.0000 [IU] | ORAL_TABLET | Freq: Every day | ORAL | Status: DC
  Administered 2020-03-24 – 2020-03-27 (×4): 1000 [IU] via ORAL
  Filled 2020-03-24 (×4): qty 1

## 2020-03-24 MED ORDER — SODIUM CHLORIDE 0.9 % IV SOLN
1.0000 g | INTRAVENOUS | Status: DC
  Administered 2020-03-24: 1 g via INTRAVENOUS
  Filled 2020-03-24 (×2): qty 10

## 2020-03-24 MED ORDER — ONDANSETRON HCL 4 MG PO TABS: 4.0000 mg | ORAL_TABLET | Freq: Four times a day (QID) | ORAL | Status: DC | PRN

## 2020-03-24 MED ORDER — ATENOLOL 25 MG PO TABS: 25.0000 mg | ORAL_TABLET | Freq: Every day | ORAL | Status: DC

## 2020-03-24 MED ORDER — ATORVASTATIN CALCIUM 10 MG PO TABS
10.0000 mg | ORAL_TABLET | Freq: Every day | ORAL | Status: DC
  Administered 2020-03-24 – 2020-03-27 (×4): 10 mg via ORAL
  Filled 2020-03-24 (×4): qty 1

## 2020-03-24 MED ORDER — SODIUM CHLORIDE 0.9 % IV SOLN: INTRAVENOUS | Status: DC

## 2020-03-24 MED ORDER — LEVOTHYROXINE SODIUM 100 MCG PO TABS
100.0000 ug | ORAL_TABLET | Freq: Every day | ORAL | Status: DC
  Administered 2020-03-25 – 2020-03-27 (×3): 100 ug via ORAL
  Filled 2020-03-24 (×3): qty 1

## 2020-03-24 MED ORDER — ADULT MULTIVITAMIN W/MINERALS CH
1.0000 | ORAL_TABLET | Freq: Every day | ORAL | Status: DC
  Administered 2020-03-25 – 2020-03-27 (×3): 1 via ORAL
  Filled 2020-03-24 (×3): qty 1

## 2020-03-24 MED ORDER — ACETAMINOPHEN 500 MG PO TABS
500.0000 mg | ORAL_TABLET | Freq: Four times a day (QID) | ORAL | Status: DC | PRN
  Administered 2020-03-26: 500 mg via ORAL
  Filled 2020-03-24: qty 1

## 2020-03-24 MED ORDER — PANTOPRAZOLE SODIUM 40 MG PO TBEC
40.0000 mg | DELAYED_RELEASE_TABLET | Freq: Two times a day (BID) | ORAL | Status: DC
  Administered 2020-03-24 – 2020-03-27 (×7): 40 mg via ORAL
  Filled 2020-03-24 (×7): qty 1

## 2020-03-24 MED ORDER — DULOXETINE HCL 30 MG PO CPEP
120.0000 mg | ORAL_CAPSULE | Freq: Every day | ORAL | Status: DC
  Administered 2020-03-24 – 2020-03-27 (×4): 120 mg via ORAL
  Filled 2020-03-24 (×3): qty 4
  Filled 2020-03-24: qty 2

## 2020-03-24 MED ORDER — ONDANSETRON HCL 4 MG/2ML IJ SOLN: 4.0000 mg | Freq: Four times a day (QID) | INTRAMUSCULAR | Status: DC | PRN

## 2020-03-24 NOTE — ED Notes (Signed)
Report to [facility], rn.

## 2020-03-24 NOTE — ED Notes (Signed)
Gave PT a glass of water. No other needs at this time.

## 2020-03-24 NOTE — ED Provider Notes (Signed)
[facility] Emergency Department Provider Note   ____________________________________________   First MD Initiated Contact with Patient 03/24/20 [phone]     (approximate)  I have reviewed the triage vital signs and the nursing notes.   HISTORY  Chief Complaint Altered Mental Status  Level V caveat: Limited by altered mentation  HPI Jenny [name] is a 86 y.o. female brought to the ED via EMS from home with a chief complaint of altered mental status and fall.  Patient has a history of hyperparathyroidism, hypertension, paroxysmal atrial fibrillation on Eliquis.  Patient's family took her to [facility] clinic yesterday for a 3-day history of altered mentation, wandering around the house without pants.  Despite a normal UA, she was treated with Omnicef.  Daughter also requested a Covid test which is still pending.  Tonight family found patient sitting next to the toilet in the bathroom after hearing a thud.  Dried blood found on patient's nose.  Rest of history is unobtainable secondary to patient's altered mentation.        Past Medical History:  Diagnosis Date  . Depression   . Dyspnea   . Dysrhythmia   . GERD (gastroesophageal reflux disease)   . HBP (high blood pressure)   . Reflux   . Skin cancer   . Thyroid disease   Dementia  Patient Active Problem List   Diagnosis Date Noted  . Acute metabolic encephalopathy 79/89/2119  . Avitaminosis D 06/07/2015  . Back ache 11/22/2013  . Artery disease, cerebral 10/26/2013  . Diverticulitis of colon 10/26/2013  . Acid reflux 10/26/2013  . Calcium blood increased 10/26/2013  . HLD (hyperlipidemia) 10/26/2013    Past Surgical History:  Procedure Laterality Date  . BREAST BIOPSY Right   . CARDIOVERSION N/A 04/07/2018   Procedure: CARDIOVERSION;  Surgeon: Teodoro Spray, MD;  Location: [facility] ORS;  Service: Cardiovascular;  Laterality: N/A;  . ESOPHAGOGASTRODUODENOSCOPY (EGD) WITH PROPOFOL N/A 04/26/2015     Procedure: ESOPHAGOGASTRODUODENOSCOPY (EGD) WITH PROPOFOL;  Surgeon: Manya Silvas, MD;  Location: [facility] ENDOSCOPY;  Service: Endoscopy;  Laterality: N/A;  . KNEE ARTHROSCOPY WITH LATERAL MENISECTOMY Left 03/22/2019   Procedure: KNEE ARTHROSCOPY WITH PARTIAL, MEDIAL AND LATERAL MENISECTOMY;  Surgeon: Hessie Knows, MD;  Location: [facility] ORS;  Service: Orthopedics;  Laterality: Left;  . NECK SURGERY      Prior to Admission medications   Medication Sig Start Date End Date Taking? Authorizing Provider  acetaminophen (TYLENOL) 500 MG tablet Take 500 mg by mouth every 6 (six) hours as needed.    [provider]  apixaban (ELIQUIS) 5 MG TABS tablet Take 5 mg by mouth 2 (two) times daily.    [provider]  citalopram (CELEXA) 20 MG tablet Take 20 mg by mouth daily.  09/06/15   [provider]  clopidogrel (PLAVIX) 75 MG tablet Take 75 mg by mouth daily.  05/07/13   [provider]  diltiazem (DILACOR XR) 120 MG 24 hr capsule Take 120 mg by mouth daily.    [provider]  DULoxetine (CYMBALTA) 30 MG capsule Take 60 mg by mouth daily.     [provider]  HYDROcodone-acetaminophen (NORCO) 5-325 MG tablet Take 1 tablet by mouth every 6 (six) hours as needed for moderate pain. 03/22/19   Hessie Knows, MD  levothyroxine (SYNTHROID) 100 MCG tablet Take 100 mcg by mouth daily before breakfast.    [provider]  meloxicam (MOBIC) 15 MG tablet Take 1 tablet (15 mg total) by mouth  daily. 10/03/15   Hyatt, Max T, DPM  Multiple Vitamins-Minerals (MULTIVITAMIN PO) Take 1 tablet by mouth daily.     [provider]  pantoprazole (PROTONIX) 20 MG tablet Take 40 mg by mouth 2 (two) times daily.  05/07/13   [provider]    Allergies Flagyl [metronidazole]  No family history on file.  Social History Social History   Tobacco Use  . Smoking status: Never Smoker  . Smokeless tobacco: Never Used  Vaping Use  . Vaping Use: Never  used  Substance Use Topics  . Alcohol use: No    Alcohol/week: 0.0 standard drinks  . Drug use: No    Review of Systems  Constitutional: Positive for generalized weakness and fall.  No fever/chills Eyes: No visual changes. ENT: Positive for dried nasal blood.  No sore throat. Cardiovascular: Denies chest pain. Respiratory: Denies shortness of breath. Gastrointestinal: No abdominal pain.  No nausea, no vomiting.  No diarrhea.  No constipation. Genitourinary: Negative for dysuria. Musculoskeletal: Negative for back pain. Skin: Negative for rash. Neurological: Positive for altered mentation.  Negative for headaches, focal weakness or numbness.   ____________________________________________   PHYSICAL EXAM:  VITAL SIGNS: ED Triage Vitals [03/24/20 0458]  Enc Vitals Group     BP      Pulse      Resp      Temp      Temp src      SpO2      Weight 180 lb (81.6 kg)     Height 5\' 7"  (1.702 m)     Head Circumference      [place] Flow      Pain Score      Pain Loc      Pain Edu?      Excl. in [place]?     Constitutional: Alert and oriented.  Elderly appearing, extremely hard of hearing and in mild acute distress. Eyes: Conjunctivae are normal. PERRL. EOMI. Head: Atraumatic. Nose: Dried blood in both nares. Mouth/Throat: Mucous membranes are mildly dry.  No dental malocclusion.   Neck: No stridor.  No cervical spine tenderness to palpation.  No step-offs or deformities noted. Cardiovascular: Normal rate, regular rhythm. Grossly normal heart sounds.  Good peripheral circulation. Respiratory: Normal respiratory effort.  No retractions. Lungs CTAB. Gastrointestinal: Soft and nontender to light or deep palpation. No distention. No abdominal bruits. No CVA tenderness. Musculoskeletal: No spinal tenderness to palpation.  Pelvis is stable.  No lower extremity tenderness nor edema.  No joint effusions. Neurologic: Alert and oriented to person.  CN II to XII grossly intact.  Normal speech  and language. No gross focal neurologic deficits are appreciated. MAEx4. Skin:  Skin is warm, dry and intact. No rash noted.  No petechiae. Psychiatric: Mood and affect are normal. Speech and behavior are normal.  ____________________________________________   LABS (all labs ordered are listed, but only abnormal results are displayed)  Labs Reviewed  CBC WITH DIFFERENTIAL/PLATELET - Abnormal; Notable for the following components:      Result Value   WBC 12.1 (*)    Hemoglobin 11.6 (*)    Neutro Abs 8.6 (*)    All other components within normal limits  COMPREHENSIVE METABOLIC PANEL - Abnormal; Notable for the following components:   Glucose, Bld 153 (*)    BUN 25 (*)    Creatinine, Ser 1.02 (*)    Calcium 8.6 (*)    Alkaline Phosphatase 25 (*)    GFR calc non Af Amer 49 (*)  GFR calc Af Amer 56 (*)    All other components within normal limits  URINALYSIS, COMPLETE (UACMP) WITH MICROSCOPIC - Abnormal; Notable for the following components:   Color, Urine YELLOW (*)    APPearance HAZY (*)    Hgb urine dipstick LARGE (*)    Protein, ur 30 (*)    RBC / HPF >50 (*)    All other components within normal limits  GLUCOSE, CAPILLARY - Abnormal; Notable for the following components:   Glucose-Capillary 143 (*)    All other components within normal limits  SARS CORONAVIRUS 2 BY RT PCR (HOSPITAL ORDER, [facility])  URINE CULTURE  CULTURE, BLOOD (ROUTINE X 2)  CULTURE, BLOOD (ROUTINE X 2)  LACTIC ACID, PLASMA  TROPONIN I (HIGH SENSITIVITY)  TROPONIN I (HIGH SENSITIVITY)   ____________________________________________  EKG  ED ECG REPORT I, Sara Keys J, the attending physician, personally viewed and interpreted this ECG.   Date: 03/24/2020  EKG Time: 0459  Rate: 58  Rhythm: atrial fibrillation, rate 58  Axis: Normal  Intervals:none  ST&T Change: Nonspecific  ____________________________________________  RADIOLOGY  ED MD interpretation:  CT  Head/Cspine/Maxillofacial negative. No acute cardiopulmonary process.  Official radiology report(s): CT Head Wo Contrast  Result Date: 03/24/2020 CLINICAL DATA:  Facial trauma.  Fall.  Progressive confusion. EXAM: CT HEAD WITHOUT CONTRAST CT MAXILLOFACIAL WITHOUT CONTRAST CT CERVICAL SPINE WITHOUT CONTRAST TECHNIQUE: Multidetector CT imaging of the head, cervical spine, and maxillofacial structures were performed using the standard protocol without intravenous contrast. Multiplanar CT image reconstructions of the cervical spine and maxillofacial structures were also generated. COMPARISON:  CT head 02/02/2019 FINDINGS: CT HEAD FINDINGS Brain: No evidence of acute infarction, hemorrhage, hydrocephalus, extra-axial collection or mass lesion/mass effect. Mild prominence of the sulci and ventricles. There is mild diffuse low-attenuation within the subcortical and periventricular white matter compatible with chronic microvascular disease. Vascular: No hyperdense vessel or unexpected calcification. Skull: Normal. Negative for fracture or focal lesion. Other: None. CT MAXILLOFACIAL FINDINGS Osseous: No fracture or mandibular dislocation. No destructive process. Orbits: Negative. No traumatic or inflammatory finding. Sinuses: Clear. Soft tissues: Negative. CT CERVICAL SPINE FINDINGS Alignment: Straightening of normal cervical lordosis. Skull base and vertebrae: No acute fracture. No primary bone lesion or focal pathologic process. Soft tissues and spinal canal: No prevertebral fluid or swelling. No visible canal hematoma. Disc levels: Multilevel disc space narrowing and endplate spurring noted compatible with degenerative disc disease. Most advanced C5-6. Upper chest: Aortic atherosclerotic calcifications noted Other: None IMPRESSION: 1. No acute intracranial abnormality. 2. Chronic small vessel ischemic disease and brain atrophy. 3. No evidence for facial bone fracture. 4. No evidence for cervical spine fracture or  dislocation. 5. Cervical degenerative disc disease. 6. Aortic atherosclerosis. Aortic Atherosclerosis (ICD10-I70.0). Electronically Signed   By: Kerby Moors M.D.   On: 03/24/2020 06:07   CT Cervical Spine Wo Contrast  Result Date: 03/24/2020 CLINICAL DATA:  Facial trauma.  Fall.  Progressive confusion. EXAM: CT HEAD WITHOUT CONTRAST CT MAXILLOFACIAL WITHOUT CONTRAST CT CERVICAL SPINE WITHOUT CONTRAST TECHNIQUE: Multidetector CT imaging of the head, cervical spine, and maxillofacial structures were performed using the standard protocol without intravenous contrast. Multiplanar CT image reconstructions of the cervical spine and maxillofacial structures were also generated. COMPARISON:  CT head 02/02/2019 FINDINGS: CT HEAD FINDINGS Brain: No evidence of acute infarction, hemorrhage, hydrocephalus, extra-axial collection or mass lesion/mass effect. Mild prominence of the sulci and ventricles. There is mild diffuse low-attenuation within the subcortical and periventricular white matter compatible with chronic  microvascular disease. Vascular: No hyperdense vessel or unexpected calcification. Skull: Normal. Negative for fracture or focal lesion. Other: None. CT MAXILLOFACIAL FINDINGS Osseous: No fracture or mandibular dislocation. No destructive process. Orbits: Negative. No traumatic or inflammatory finding. Sinuses: Clear. Soft tissues: Negative. CT CERVICAL SPINE FINDINGS Alignment: Straightening of normal cervical lordosis. Skull base and vertebrae: No acute fracture. No primary bone lesion or focal pathologic process. Soft tissues and spinal canal: No prevertebral fluid or swelling. No visible canal hematoma. Disc levels: Multilevel disc space narrowing and endplate spurring noted compatible with degenerative disc disease. Most advanced C5-6. Upper chest: Aortic atherosclerotic calcifications noted Other: None IMPRESSION: 1. No acute intracranial abnormality. 2. Chronic small vessel ischemic disease and brain  atrophy. 3. No evidence for facial bone fracture. 4. No evidence for cervical spine fracture or dislocation. 5. Cervical degenerative disc disease. 6. Aortic atherosclerosis. Aortic Atherosclerosis (ICD10-I70.0). Electronically Signed   By: Kerby Moors M.D.   On: 03/24/2020 06:07   DG Chest Port 1 View  Result Date: 03/24/2020 CLINICAL DATA:  Weakness EXAM: PORTABLE CHEST 1 VIEW COMPARISON:  07/31/2015 FINDINGS: The heart size and mediastinal contours are within normal limits. Both lungs are clear. The visualized skeletal structures are unremarkable. IMPRESSION: No active disease. Electronically Signed   By: Ulyses Jarred M.D.   On: 03/24/2020 05:26   CT Maxillofacial WO CM  Result Date: 03/24/2020 CLINICAL DATA:  Facial trauma.  Fall.  Progressive confusion. EXAM: CT HEAD WITHOUT CONTRAST CT MAXILLOFACIAL WITHOUT CONTRAST CT CERVICAL SPINE WITHOUT CONTRAST TECHNIQUE: Multidetector CT imaging of the head, cervical spine, and maxillofacial structures were performed using the standard protocol without intravenous contrast. Multiplanar CT image reconstructions of the cervical spine and maxillofacial structures were also generated. COMPARISON:  CT head 02/02/2019 FINDINGS: CT HEAD FINDINGS Brain: No evidence of acute infarction, hemorrhage, hydrocephalus, extra-axial collection or mass lesion/mass effect. Mild prominence of the sulci and ventricles. There is mild diffuse low-attenuation within the subcortical and periventricular white matter compatible with chronic microvascular disease. Vascular: No hyperdense vessel or unexpected calcification. Skull: Normal. Negative for fracture or focal lesion. Other: None. CT MAXILLOFACIAL FINDINGS Osseous: No fracture or mandibular dislocation. No destructive process. Orbits: Negative. No traumatic or inflammatory finding. Sinuses: Clear. Soft tissues: Negative. CT CERVICAL SPINE FINDINGS Alignment: Straightening of normal cervical lordosis. Skull base and vertebrae:  No acute fracture. No primary bone lesion or focal pathologic process. Soft tissues and spinal canal: No prevertebral fluid or swelling. No visible canal hematoma. Disc levels: Multilevel disc space narrowing and endplate spurring noted compatible with degenerative disc disease. Most advanced C5-6. Upper chest: Aortic atherosclerotic calcifications noted Other: None IMPRESSION: 1. No acute intracranial abnormality. 2. Chronic small vessel ischemic disease and brain atrophy. 3. No evidence for facial bone fracture. 4. No evidence for cervical spine fracture or dislocation. 5. Cervical degenerative disc disease. 6. Aortic atherosclerosis. Aortic Atherosclerosis (ICD10-I70.0). Electronically Signed   By: Kerby Moors M.D.   On: 03/24/2020 06:07    ____________________________________________   PROCEDURES  Procedure(s) performed (including Critical Care):  .1-3 Lead EKG Interpretation Performed by: Paulette Blanch, MD Authorized by: Paulette Blanch, MD     Interpretation: abnormal     ECG rate:  58   ECG rate assessment: bradycardic     Rhythm: atrial fibrillation     Ectopy: none     Conduction: normal   Comments:     Patient placed on cardiac monitor to evaluate for arrhythmias     ____________________________________________   INITIAL IMPRESSION /  ASSESSMENT AND PLAN / ED COURSE  As part of my medical decision making, I reviewed the following data within the [facility] notes reviewed and incorporated, Labs reviewed, EKG interpreted, Old chart reviewed, Radiograph reviewed, Discussed with admitting physician and Notes from prior ED visits        86 year old female on [place] for UTI symptoms presenting with altered mentation and fall. Differential diagnosis includes, but is not limited to, alcohol, illicit or prescription medications, or other toxic ingestion; intracranial pathology such as stroke or intracerebral hemorrhage; fever or infectious causes including  sepsis; hypoxemia and/or hypercarbia; uremia; trauma; endocrine related disorders such as diabetes, hypoglycemia, and thyroid-related diseases; hypertensive encephalopathy; etc.  I have personally reviewed patient's clinic note from yesterday and verified normal UA.  Covid result not back yet.  Will obtain sepsis work-up, CT head/C-spine/maxillofacial.  Apply Bair hugger for subpar rectal temperature.  Obtain Covid swab.  Anticipate hospitalization.   Clinical Course as of Mar 24 634  Sat Mar 24, 2020  0635 Discussed case with hospitalist services for admission.   [JS]    Clinical Course User Index [JS] Paulette Blanch, MD     ____________________________________________   FINAL CLINICAL IMPRESSION(S) / ED DIAGNOSES  Final diagnoses:  Altered mental status, unspecified altered mental status type  Fall, initial encounter  Weakness  Dementia without behavioral disturbance, unspecified dementia type [facility])     ED Discharge Orders    None      *Please note:  Jenny [name] was evaluated in Emergency Department on 03/24/2020 for the symptoms described in the history of present illness. She was evaluated in the context of the global COVID-19 pandemic, which necessitated consideration that the patient might be at risk for infection with the SARS-CoV-2 virus that causes COVID-19. Institutional protocols and algorithms that pertain to the evaluation of patients at risk for COVID-19 are in a state of rapid change based on information released by regulatory bodies including the CDC and federal and state organizations. These policies and algorithms were followed during the patient's care in the ED.  Some ED evaluations and interventions may be delayed as a result of limited staffing during and the pandemic.*   Note:  This document was prepared using Dragon voice recognition software and may include unintentional dictation errors.   Paulette Blanch, MD 03/24/20 [phone]

## 2020-03-24 NOTE — ED Notes (Signed)
Cannot hear without her hearing aids

## 2020-03-24 NOTE — Progress Notes (Signed)
Physical Therapy Evaluation Patient Details Name: Jenny [name] MRN: [id number] DOB: 1928/05/19 Today's Date: 03/24/2020   History of Present Illness  Per MD notes:Jenny [name] is a 86 y.o. female with medical history significant for atrial fibrillation on chronic anticoagulation therapy, hypertension, hyperparathyroidism who was brought into the ER via EMS for evaluation of change in mental status and a fall.  Family had found patient sitting next to a toilet after they heard a loud thud, EMS was called and patient was found with dried blood on her nose.  There was a copious amount of blood in the commode and patient's daughter states that she has frequent nosebleeds.  Patient does not remember if she hit her head when she fell.  Family states that she has been increasingly confused over the last 24 to 48 hours.  Clinical Impression  Patient agrees to PT eval. She has 3/5 BLE hip flex and knee extension strength. She has good sitting balance and good standing balance with UE support with RW. She is MI with bed mobility, min assist for transfers sit to stand with RW. She ambulates with RW 8 feet fwd and bwd due to leads and lines from ER with MI assist. She steps up and down off a small 3 inch step x 4. Patient will continue to benefit from skilled PT to improve mobility and strength.    Follow Up Recommendations Home health PT    Equipment Recommendations  None recommended by PT    Recommendations for Other Services       Precautions / Restrictions Precautions Precautions: Fall Restrictions Weight Bearing Restrictions: No      Mobility  Bed Mobility Overal bed mobility: Modified Independent                Transfers Overall transfer level: Modified independent Equipment used: Rolling walker (2 wheeled)                Ambulation/Gait Ambulation/Gait assistance: Modified independent (Device/Increase time) Gait Distance (Feet): 8 Feet Assistive device: Rolling  walker (2 wheeled) Gait Pattern/deviations: Step-through pattern     General Gait Details:  (Limited by lines and leads/ could have ambulated further)  Stairs            Wheelchair Mobility    Modified Rankin (Stroke Patients Only)       Balance Overall balance assessment: Modified Independent                                           Pertinent Vitals/Pain Pain Assessment: No/denies pain    Home Living Family/patient expects to be discharged to:: Private residence Living Arrangements: Spouse/significant other   Type of Home: House Home Access: Stairs to enter Entrance Stairs-Rails: Right Entrance Stairs-Number of Steps: 3 Home Layout: One level Home Equipment: Environmental consultant - 2 wheels      Prior Function Level of Independence: Independent with assistive device(s)               Hand Dominance        Extremity/Trunk Assessment        Lower Extremity Assessment Lower Extremity Assessment: Generalized weakness       Communication   Communication: No difficulties;HOH  Cognition Arousal/Alertness: Awake/alert Behavior During Therapy: WFL for tasks assessed/performed Overall Cognitive Status: Within Functional Limits for tasks assessed  General Comments      Exercises     Assessment/Plan    PT Assessment Patient needs continued PT services  PT Problem List Decreased strength;Decreased mobility       PT Treatment Interventions Gait training;Therapeutic activities;Therapeutic exercise    PT Goals (Current goals can be found in the Care Plan section)  Acute Rehab PT Goals Patient Stated Goal:  (no goals stated) PT Goal Formulation: Patient unable to participate in goal setting Time For Goal Achievement: 04/07/20 Potential to Achieve Goals: Good    Frequency Min 2X/week   Barriers to discharge        Co-evaluation               AM-PAC PT "6 Clicks" Mobility   Outcome Measure Help needed turning from your back to your side while in a flat bed without using bedrails?: A Little Help needed moving from lying on your back to sitting on the side of a flat bed without using bedrails?: A Little Help needed moving to and from a bed to a chair (including a wheelchair)?: A Little Help needed standing up from a chair using your arms (e.g., wheelchair or bedside chair)?: A Little Help needed to walk in hospital room?: A Little Help needed climbing 3-5 steps with a railing? : A Little 6 Click Score: 18    End of Session Equipment Utilized During Treatment: Gait belt   Patient left: in bed (ER/ stretcher)   PT Visit Diagnosis: Muscle weakness (generalized) (M62.81);History of falling (Z91.81);Difficulty in walking, not elsewhere classified (R26.2)    Time: 1000-1025 PT Time Calculation (min) (ACUTE ONLY): 25 min   Charges:   PT Evaluation $PT Eval Low Complexity: 1 Low PT Treatments $Gait Training: 8-22 mins         Alanson Puls, PT DPT 03/24/2020, 11:29 AM

## 2020-03-24 NOTE — ED Notes (Signed)
Patient transported to CT 

## 2020-03-24 NOTE — Consult Note (Signed)
Cardiology Consultation Note    Patient ID: Jenny [name], MRN: [id number], DOB/AGE: August 31, 1927 86 y.o. Admit date: 03/24/2020   Date of Consult: 03/24/2020 Primary Physician: Derinda Late, MD Primary Cardiologist: Dr. Ubaldo Glassing  Chief Complaint: epistaxis/falls Reason for Consultation: sif/apistaxis Requesting MD: dr. Francine Graven  HPI: Jenny [name] is a 86 y.o. female with history of paroxsymal atrial fibrillations/p cardioversion, anticoagulated with Eliquis, history of CVA, and hyperlipidemia who presented to emergency room with parent altered mental status and a fall.  Patient found sitting on the floor.  Patient had blood from her nose.  Patient is not able to say whether she hit her head when she fell.  She apparently has been increasingly confused over the past several days.  She is on Eliquis for anticoagulation as mentioned previously.  Cervical spine and head CT showed no acute process other than small vessel ischemic disease and brain atrophy.  Hemoglobin is 11.6 creatinine 1.02.  EKG showed A. fib at a rate of 58.  Chest x-ray showed no acute cardiopulmonary disease. She remains somewhat confused and disoriented. Somewhat hypertensive.   Past Medical History:  Diagnosis Date  . Depression   . Dyspnea   . Dysrhythmia   . GERD (gastroesophageal reflux disease)   . HBP (high blood pressure)   . Reflux   . Skin cancer   . Thyroid disease       Surgical History:  Past Surgical History:  Procedure Laterality Date  . BREAST BIOPSY Right   . CARDIOVERSION N/A 04/07/2018   Procedure: CARDIOVERSION;  Surgeon: Teodoro Spray, MD;  Location: [facility] ORS;  Service: Cardiovascular;  Laterality: N/A;  . ESOPHAGOGASTRODUODENOSCOPY (EGD) WITH PROPOFOL N/A 04/26/2015   Procedure: ESOPHAGOGASTRODUODENOSCOPY (EGD) WITH PROPOFOL;  Surgeon: Manya Silvas, MD;  Location: [facility] ENDOSCOPY;  Service: Endoscopy;  Laterality: N/A;  . KNEE ARTHROSCOPY WITH LATERAL MENISECTOMY Left 03/22/2019    Procedure: KNEE ARTHROSCOPY WITH PARTIAL, MEDIAL AND LATERAL MENISECTOMY;  Surgeon: Hessie Knows, MD;  Location: [facility] ORS;  Service: Orthopedics;  Laterality: Left;  . NECK SURGERY       Home Meds: Prior to Admission medications   Medication Sig Start Date End Date Taking? Authorizing Provider  acetaminophen (TYLENOL) 500 MG tablet Take 500 mg by mouth every 6 (six) hours as needed for mild pain, fever or headache.    Yes [provider]  apixaban (ELIQUIS) 5 MG TABS tablet Take 5 mg by mouth 2 (two) times daily.   Yes [provider]  atenolol (TENORMIN) 25 MG tablet Take 25 mg by mouth daily. 01/27/20  Yes [provider]  atorvastatin (LIPITOR) 10 MG tablet Take 10 mg by mouth daily. 01/19/20  Yes [provider]  cefdinir (OMNICEF) 300 MG capsule Take 300 mg by mouth 2 (two) times daily. 03/23/20 03/30/20 Yes [provider]  cholecalciferol (VITAMIN D3) 25 MCG (1000 UNIT) tablet Take 1,000 Units by mouth daily.   Yes [provider]  DULoxetine (CYMBALTA) 60 MG capsule Take 120 mg by mouth daily.    Yes [provider]  levothyroxine (SYNTHROID) 100 MCG tablet Take 100 mcg by mouth daily before breakfast.   Yes [provider]  Multiple Vitamins-Minerals (MULTIVITAMIN PO) Take 1 tablet by mouth daily.    Yes [provider]  pantoprazole (PROTONIX) 40 MG tablet Take 40 mg by mouth 2 (two) times daily.  05/07/13  Yes [provider]    Inpatient Medications:  . atorvastatin  10 mg Oral Daily  .  cholecalciferol  1,000 Units Oral Daily  . DULoxetine  120 mg Oral Daily  . levothyroxine  100 mcg Oral QAC breakfast  . Multivitamin   Oral Daily  . pantoprazole  40 mg Oral BID   . sodium chloride    . cefTRIAXone (ROCEPHIN)  IV      Allergies:  Allergies  Allergen Reactions  . Flagyl [Metronidazole] Nausea And Vomiting    Social History   Socioeconomic History  . Marital status: Married     Spouse name: Not on file  . Number of children: Not on file  . Years of education: Not on file  . Highest education level: Not on file  Occupational History  . Not on file  Tobacco Use  . Smoking status: Never Smoker  . Smokeless tobacco: Never Used  Vaping Use  . Vaping Use: Never used  Substance and Sexual Activity  . Alcohol use: No    Alcohol/week: 0.0 standard drinks  . Drug use: No  . Sexual activity: Not on file  Other Topics Concern  . Not on file  Social History Narrative  . Not on file   Social Determinants of Health   Financial Resource Strain:   . Difficulty of Paying Living Expenses: Not on file  Food Insecurity:   . Worried About Charity fundraiser in the Last Year: Not on file  . Ran Out of Food in the Last Year: Not on file  Transportation Needs:   . Lack of Transportation (Medical): Not on file  . Lack of Transportation (Non-Medical): Not on file  Physical Activity:   . Days of Exercise per Week: Not on file  . Minutes of Exercise per Session: Not on file  Stress:   . Feeling of Stress : Not on file  Social Connections:   . Frequency of Communication with Friends and Family: Not on file  . Frequency of Social Gatherings with Friends and Family: Not on file  . Attends Religious Services: Not on file  . Active Member of Clubs or Organizations: Not on file  . Attends Archivist Meetings: Not on file  . Marital Status: Not on file  Intimate Partner Violence:   . Fear of Current or Ex-Partner: Not on file  . Emotionally Abused: Not on file  . Physically Abused: Not on file  . Sexually Abused: Not on file     No family history on file.   Review of Systems: A 12-system review of systems was performed and is negative except as noted in the HPI.  Labs: Recent Labs    03/24/20 0651  CKTOTAL 133   Lab Results  Component Value Date   WBC 12.1 (H) 03/24/2020   HGB 11.6 (L) 03/24/2020   HCT 36.3 03/24/2020   MCV 91.7 03/24/2020   PLT  269 03/24/2020    Recent Labs  Lab 03/24/20 0507  NA 136  K 4.5  CL 102  CO2 28  BUN 25*  CREATININE 1.02*  CALCIUM 8.6*  PROT 6.6  BILITOT 0.5  ALKPHOS 25*  ALT 19  AST 22  GLUCOSE 153*   No results found for: CHOL, HDL, LDLCALC, TRIG No results found for: DDIMER  Radiology/Studies:  CT Head Wo Contrast  Result Date: 03/24/2020 CLINICAL DATA:  Facial trauma.  Fall.  Progressive confusion. EXAM: CT HEAD WITHOUT CONTRAST CT MAXILLOFACIAL WITHOUT CONTRAST CT CERVICAL SPINE WITHOUT CONTRAST TECHNIQUE: Multidetector CT imaging of the head, cervical spine, and maxillofacial structures were performed using the  standard protocol without intravenous contrast. Multiplanar CT image reconstructions of the cervical spine and maxillofacial structures were also generated. COMPARISON:  CT head 02/02/2019 FINDINGS: CT HEAD FINDINGS Brain: No evidence of acute infarction, hemorrhage, hydrocephalus, extra-axial collection or mass lesion/mass effect. Mild prominence of the sulci and ventricles. There is mild diffuse low-attenuation within the subcortical and periventricular white matter compatible with chronic microvascular disease. Vascular: No hyperdense vessel or unexpected calcification. Skull: Normal. Negative for fracture or focal lesion. Other: None. CT MAXILLOFACIAL FINDINGS Osseous: No fracture or mandibular dislocation. No destructive process. Orbits: Negative. No traumatic or inflammatory finding. Sinuses: Clear. Soft tissues: Negative. CT CERVICAL SPINE FINDINGS Alignment: Straightening of normal cervical lordosis. Skull base and vertebrae: No acute fracture. No primary bone lesion or focal pathologic process. Soft tissues and spinal canal: No prevertebral fluid or swelling. No visible canal hematoma. Disc levels: Multilevel disc space narrowing and endplate spurring noted compatible with degenerative disc disease. Most advanced C5-6. Upper chest: Aortic atherosclerotic calcifications noted Other:  None IMPRESSION: 1. No acute intracranial abnormality. 2. Chronic small vessel ischemic disease and brain atrophy. 3. No evidence for facial bone fracture. 4. No evidence for cervical spine fracture or dislocation. 5. Cervical degenerative disc disease. 6. Aortic atherosclerosis. Aortic Atherosclerosis (ICD10-I70.0). Electronically Signed   By: Kerby Moors M.D.   On: 03/24/2020 06:07   CT Cervical Spine Wo Contrast  Result Date: 03/24/2020 CLINICAL DATA:  Facial trauma.  Fall.  Progressive confusion. EXAM: CT HEAD WITHOUT CONTRAST CT MAXILLOFACIAL WITHOUT CONTRAST CT CERVICAL SPINE WITHOUT CONTRAST TECHNIQUE: Multidetector CT imaging of the head, cervical spine, and maxillofacial structures were performed using the standard protocol without intravenous contrast. Multiplanar CT image reconstructions of the cervical spine and maxillofacial structures were also generated. COMPARISON:  CT head 02/02/2019 FINDINGS: CT HEAD FINDINGS Brain: No evidence of acute infarction, hemorrhage, hydrocephalus, extra-axial collection or mass lesion/mass effect. Mild prominence of the sulci and ventricles. There is mild diffuse low-attenuation within the subcortical and periventricular white matter compatible with chronic microvascular disease. Vascular: No hyperdense vessel or unexpected calcification. Skull: Normal. Negative for fracture or focal lesion. Other: None. CT MAXILLOFACIAL FINDINGS Osseous: No fracture or mandibular dislocation. No destructive process. Orbits: Negative. No traumatic or inflammatory finding. Sinuses: Clear. Soft tissues: Negative. CT CERVICAL SPINE FINDINGS Alignment: Straightening of normal cervical lordosis. Skull base and vertebrae: No acute fracture. No primary bone lesion or focal pathologic process. Soft tissues and spinal canal: No prevertebral fluid or swelling. No visible canal hematoma. Disc levels: Multilevel disc space narrowing and endplate spurring noted compatible with degenerative  disc disease. Most advanced C5-6. Upper chest: Aortic atherosclerotic calcifications noted Other: None IMPRESSION: 1. No acute intracranial abnormality. 2. Chronic small vessel ischemic disease and brain atrophy. 3. No evidence for facial bone fracture. 4. No evidence for cervical spine fracture or dislocation. 5. Cervical degenerative disc disease. 6. Aortic atherosclerosis. Aortic Atherosclerosis (ICD10-I70.0). Electronically Signed   By: Kerby Moors M.D.   On: 03/24/2020 06:07   DG Chest Port 1 View  Result Date: 03/24/2020 CLINICAL DATA:  Weakness EXAM: PORTABLE CHEST 1 VIEW COMPARISON:  07/31/2015 FINDINGS: The heart size and mediastinal contours are within normal limits. Both lungs are clear. The visualized skeletal structures are unremarkable. IMPRESSION: No active disease. Electronically Signed   By: Ulyses Jarred M.D.   On: 03/24/2020 05:26   CT Maxillofacial WO CM  Result Date: 03/24/2020 CLINICAL DATA:  Facial trauma.  Fall.  Progressive confusion. EXAM: CT HEAD WITHOUT CONTRAST CT MAXILLOFACIAL WITHOUT CONTRAST  CT CERVICAL SPINE WITHOUT CONTRAST TECHNIQUE: Multidetector CT imaging of the head, cervical spine, and maxillofacial structures were performed using the standard protocol without intravenous contrast. Multiplanar CT image reconstructions of the cervical spine and maxillofacial structures were also generated. COMPARISON:  CT head 02/02/2019 FINDINGS: CT HEAD FINDINGS Brain: No evidence of acute infarction, hemorrhage, hydrocephalus, extra-axial collection or mass lesion/mass effect. Mild prominence of the sulci and ventricles. There is mild diffuse low-attenuation within the subcortical and periventricular white matter compatible with chronic microvascular disease. Vascular: No hyperdense vessel or unexpected calcification. Skull: Normal. Negative for fracture or focal lesion. Other: None. CT MAXILLOFACIAL FINDINGS Osseous: No fracture or mandibular dislocation. No destructive process.  Orbits: Negative. No traumatic or inflammatory finding. Sinuses: Clear. Soft tissues: Negative. CT CERVICAL SPINE FINDINGS Alignment: Straightening of normal cervical lordosis. Skull base and vertebrae: No acute fracture. No primary bone lesion or focal pathologic process. Soft tissues and spinal canal: No prevertebral fluid or swelling. No visible canal hematoma. Disc levels: Multilevel disc space narrowing and endplate spurring noted compatible with degenerative disc disease. Most advanced C5-6. Upper chest: Aortic atherosclerotic calcifications noted Other: None IMPRESSION: 1. No acute intracranial abnormality. 2. Chronic small vessel ischemic disease and brain atrophy. 3. No evidence for facial bone fracture. 4. No evidence for cervical spine fracture or dislocation. 5. Cervical degenerative disc disease. 6. Aortic atherosclerosis. Aortic Atherosclerosis (ICD10-I70.0). Electronically Signed   By: Kerby Moors M.D.   On: 03/24/2020 06:07    Wt Readings from Last 3 Encounters:  03/24/20 81.6 kg  03/22/19 83.9 kg  03/18/19 83.9 kg    EKG: Atrial fibrillation with slow ventricular response at 58  Physical Exam:  Blood pressure 104/83, pulse (!) 57, temperature 97.6 F (36.4 C), temperature source Oral, resp. rate 20, height 5\' 7"  (1.702 m), weight 81.6 kg, SpO2 100 %. Body mass index is 28.19 kg/m. General: Elderly female with confusion and weakness. Head: Normocephalic, atraumatic, sclera non-icteric, no xanthomas, nares are without discharge.  Neck: Negative for carotid bruits. JVD not elevated. Lungs: Clear bilaterally to auscultation without wheezes, rales, or rhonchi. Breathing is unlabored. Heart: Irregular rhythm Abdomen: Soft, non-tender, non-distended with normoactive bowel sounds. No hepatomegaly. No rebound/guarding. No obvious abdominal masses. Msk:  Strength and tone appear normal for age. Extremities: No clubbing or cyanosis. No edema.  Distal pedal pulses are 2+ and equal  bilaterally. Neuro: Somewhat lethargic and confused     Assessment and Plan  86 y.o. female with history of paroxsymal atrial fibrillations/p cardioversion, anticoagulated with Eliquis, history of CVA, and hyperlipidemia who presented to emergency room with parent altered mental status and a fall.  Patient found sitting on the floor.  Patient had blood from her nose.  Patient is not able to say whether she hit her head when she fell.  She apparently has been increasingly confused over the past several days.  She is on Eliquis for anticoagulation as mentioned previously.  Cervical spine and head CT showed no acute process other than small vessel ischemic disease and brain atrophy.  Hemoglobin is 11.6 creatinine 1.02.  EKG showed A. fib at a rate of 58.  Chest x-ray showed no acute cardiopulmonary disease.  1.  Atrial fibrillation-CHA2DS2-VASc score is at least 35 in an 86 year old female with hypertension.  Rate is well controlled at present.  Would remain off atenolol for now following heart rate.  Patient has been on Eliquis but given falls, severe epistaxis and instability will need to discontinue this at least for now following  course.  2.  Altered mental status-no acute process on brain CT. Etiology unclear.   3. Hypertension-somewhat labile blood pressure at present. Relatively hypertensive during my exam but also somewhat agitated due to her confusion.   Signed, Teodoro Spray MD 03/24/2020, 9:26 AM Pager: [phone]

## 2020-03-24 NOTE — ED Triage Notes (Signed)
Pt arrived by EMS from home. EMS states family found pt sitting next to toilet in bathroom after hearing a thud. Blood found all over pt. Dried blood on nose and down front of body. Family states pt has been increasingly confused since yesterday.

## 2020-03-24 NOTE — ED Notes (Signed)
Pt returned from ct scan

## 2020-03-24 NOTE — ED Notes (Signed)
Pt provided tissues for nosebleed.

## 2020-03-24 NOTE — H&P (Signed)
History and Physical    [place] NTZ:[id number] DOB: 06-05-1928 DOA: 03/24/2020  PCP: Derinda Late, MD   Patient coming from: Home  I have personally briefly reviewed patient's old medical records in [place]  Chief Complaint: Change in mental status Most of the history was obtained from patient's family and ER notes  HPI: Jenny [name] is a 86 y.o. female with medical history significant for atrial fibrillation on chronic anticoagulation therapy, hypertension, hyperparathyroidism who was brought into the ER via EMS for evaluation of change in mental status and a fall.  Family had found patient sitting next to a toilet after they heard a loud thud, EMS was called and patient was found with dried blood on her nose.  There was a copious amount of blood in the commode and patient's daughter states that she has frequent nosebleeds.  Patient does not remember if she hit her head when she fell.  Family states that she has been increasingly confused over the last 24 to 48 hours. Patient had seen her primary care provider on the day prior to admission for evaluation of a 3-day history of change in mental status which family describes as patient walking around the house without pants.  Her daughter also states that she has been talking about events that have not happened.  She had some work-up done as an outpatient and was prescribed Omnicef for presumed UTI.  She had a Covid test which is still pending. Unable to do review of systems on this patient Labs show sodium of 136, potassium 4.5, chloride 102, bicarb 28, BUN 25, creatinine 1.02, calcium 8.6, albumin 3.6, AST 22, ALT 19, lactic acid 1.4, white count 12.9, hemoglobin 11.6, hematocrit 36.3, MCV 99.7, RDW 14, platelet count 269 Cervical spine CT/CT head shows no acute intracranial abnormality.  Chronic small vessel ischemic disease and brain atrophy.  No evidence for cervical spine fracture or dislocation. Chest x-ray reviewed  by me shows no obvious infiltrate or effusion. Twelve-lead EKG reviewed by me shows sinus rhythm with left axis deviation.   ED Course: Patient is an 86 year old female who was brought into the ER by EMS for evaluation of change in mental status.  Patient was started on Omnicef as an outpatient for presumed UTI.  She is also status post fall.  She will be admitted to the hospital for further evaluation.  Review of Systems: As per HPI otherwise 10 point review of systems negative.    Past Medical History:  Diagnosis Date  . Depression   . Dyspnea   . Dysrhythmia   . GERD (gastroesophageal reflux disease)   . HBP (high blood pressure)   . Reflux   . Skin cancer   . Thyroid disease     Past Surgical History:  Procedure Laterality Date  . BREAST BIOPSY Right   . CARDIOVERSION N/A 04/07/2018   Procedure: CARDIOVERSION;  Surgeon: Teodoro Spray, MD;  Location: [facility] ORS;  Service: Cardiovascular;  Laterality: N/A;  . ESOPHAGOGASTRODUODENOSCOPY (EGD) WITH PROPOFOL N/A 04/26/2015   Procedure: ESOPHAGOGASTRODUODENOSCOPY (EGD) WITH PROPOFOL;  Surgeon: Manya Silvas, MD;  Location: [facility] ENDOSCOPY;  Service: Endoscopy;  Laterality: N/A;  . KNEE ARTHROSCOPY WITH LATERAL MENISECTOMY Left 03/22/2019   Procedure: KNEE ARTHROSCOPY WITH PARTIAL, MEDIAL AND LATERAL MENISECTOMY;  Surgeon: Hessie Knows, MD;  Location: [facility] ORS;  Service: Orthopedics;  Laterality: Left;  . NECK SURGERY       reports that she has never smoked. She has never used smokeless tobacco.  She reports that she does not drink alcohol and does not use drugs.  Allergies  Allergen Reactions  . Flagyl [Metronidazole] Nausea And Vomiting    No family history on file.   Prior to Admission medications   Medication Sig Start Date End Date Taking? Authorizing Provider  acetaminophen (TYLENOL) 500 MG tablet Take 500 mg by mouth every 6 (six) hours as needed for mild pain, fever or headache.    Yes [provider]    apixaban (ELIQUIS) 5 MG TABS tablet Take 5 mg by mouth 2 (two) times daily.   Yes [provider]  atenolol (TENORMIN) 25 MG tablet Take 25 mg by mouth daily. 01/27/20  Yes [provider]  atorvastatin (LIPITOR) 10 MG tablet Take 10 mg by mouth daily. 01/19/20  Yes [provider]  cefdinir (OMNICEF) 300 MG capsule Take 300 mg by mouth 2 (two) times daily. 03/23/20 03/30/20 Yes [provider]  cholecalciferol (VITAMIN D3) 25 MCG (1000 UNIT) tablet Take 1,000 Units by mouth daily.   Yes [provider]  DULoxetine (CYMBALTA) 60 MG capsule Take 120 mg by mouth daily.    Yes [provider]  levothyroxine (SYNTHROID) 100 MCG tablet Take 100 mcg by mouth daily before breakfast.   Yes [provider]  Multiple Vitamins-Minerals (MULTIVITAMIN PO) Take 1 tablet by mouth daily.    Yes [provider]  pantoprazole (PROTONIX) 40 MG tablet Take 40 mg by mouth 2 (two) times daily.  05/07/13  Yes [provider]    Physical Exam: Vitals:   03/24/20 0615 03/24/20 0630 03/24/20 0700 03/24/20 0800  BP: (!) 146/85 (!) 150/91  104/83  Pulse:  (!) 53  (!) 57  Resp: 19 15  20   Temp:   97.6 F (36.4 C)   TempSrc:   Oral   SpO2:  100%    Weight:      Height:         Vitals:   03/24/20 0615 03/24/20 0630 03/24/20 0700 03/24/20 0800  BP: (!) 146/85 (!) 150/91  104/83  Pulse:  (!) 53  (!) 57  Resp: 19 15  20   Temp:   97.6 F (36.4 C)   TempSrc:   Oral   SpO2:  100%    Weight:      Height:        Constitutional: NAD, alert and oriented x 3 Eyes: PERRL, lids and conjunctivae pallor ENMT: Mucous membranes are moist.  Bloodsoaked packing in right nostril, hard of hearing Neck: normal, supple, no masses, no thyromegaly Respiratory: clear to auscultation bilaterally, no wheezing, no crackles. Normal respiratory effort. No accessory muscle use. Cardiovascular: Bradycardia, no murmurs / rubs / gallops. No extremity edema. 2+  pedal pulses. No carotid bruits.  Abdomen: no tenderness, no masses palpated. No hepatosplenomegaly. Bowel sounds positive.  Musculoskeletal: no clubbing / cyanosis. No joint deformity upper and lower extremities.  Skin: no rashes, lesions, ulcers.  Neurologic: No gross focal neurologic deficit. Moving all extremities Psychiatric: Normal mood and affect.   Labs on Admission: I have personally reviewed following labs and imaging studies  CBC: Recent Labs  Lab 03/24/20 0507  WBC 12.1*  NEUTROABS 8.6*  HGB 11.6*  HCT 36.3  MCV 91.7  PLT 825   Basic Metabolic Panel: Recent Labs  Lab 03/24/20 0507  NA 136  K 4.5  CL 102  CO2 28  GLUCOSE 153*  BUN 25*  CREATININE 1.02*  CALCIUM 8.6*   GFR: Estimated Creatinine Clearance:  41.1 mL/min (A) (by C-G formula based on SCr of 1.02 mg/dL (H)). Liver Function Tests: Recent Labs  Lab 03/24/20 0507  AST 22  ALT 19  ALKPHOS 25*  BILITOT 0.5  PROT 6.6  ALBUMIN 3.6   No results for input(s): LIPASE, AMYLASE in the last 168 hours. No results for input(s): AMMONIA in the last 168 hours. Coagulation Profile: No results for input(s): INR, PROTIME in the last 168 hours. Cardiac Enzymes: Recent Labs  Lab 03/24/20 0651  CKTOTAL 133   BNP (last 3 results) No results for input(s): PROBNP in the last 8760 hours. HbA1C: No results for input(s): HGBA1C in the last 72 hours. CBG: Recent Labs  Lab 03/24/20 0506  GLUCAP 143*   Lipid Profile: No results for input(s): CHOL, HDL, LDLCALC, TRIG, CHOLHDL, LDLDIRECT in the last 72 hours. Thyroid Function Tests: No results for input(s): TSH, T4TOTAL, FREET4, T3FREE, THYROIDAB in the last 72 hours. Anemia Panel: No results for input(s): VITAMINB12, FOLATE, FERRITIN, TIBC, IRON, RETICCTPCT in the last 72 hours. Urine analysis:    Component Value Date/Time   COLORURINE YELLOW (A) 03/24/2020 0507   APPEARANCEUR HAZY (A) 03/24/2020 0507   LABSPEC 1.020 03/24/2020 0507   PHURINE 5.0  03/24/2020 0507   GLUCOSEU NEGATIVE 03/24/2020 0507   HGBUR LARGE (A) 03/24/2020 0507   BILIRUBINUR NEGATIVE 03/24/2020 0507   KETONESUR NEGATIVE 03/24/2020 0507   PROTEINUR 30 (A) 03/24/2020 0507   NITRITE NEGATIVE 03/24/2020 0507   LEUKOCYTESUR NEGATIVE 03/24/2020 0507    Radiological Exams on Admission: CT Head Wo Contrast  Result Date: 03/24/2020 CLINICAL DATA:  Facial trauma.  Fall.  Progressive confusion. EXAM: CT HEAD WITHOUT CONTRAST CT MAXILLOFACIAL WITHOUT CONTRAST CT CERVICAL SPINE WITHOUT CONTRAST TECHNIQUE: Multidetector CT imaging of the head, cervical spine, and maxillofacial structures were performed using the standard protocol without intravenous contrast. Multiplanar CT image reconstructions of the cervical spine and maxillofacial structures were also generated. COMPARISON:  CT head 02/02/2019 FINDINGS: CT HEAD FINDINGS Brain: No evidence of acute infarction, hemorrhage, hydrocephalus, extra-axial collection or mass lesion/mass effect. Mild prominence of the sulci and ventricles. There is mild diffuse low-attenuation within the subcortical and periventricular white matter compatible with chronic microvascular disease. Vascular: No hyperdense vessel or unexpected calcification. Skull: Normal. Negative for fracture or focal lesion. Other: None. CT MAXILLOFACIAL FINDINGS Osseous: No fracture or mandibular dislocation. No destructive process. Orbits: Negative. No traumatic or inflammatory finding. Sinuses: Clear. Soft tissues: Negative. CT CERVICAL SPINE FINDINGS Alignment: Straightening of normal cervical lordosis. Skull base and vertebrae: No acute fracture. No primary bone lesion or focal pathologic process. Soft tissues and spinal canal: No prevertebral fluid or swelling. No visible canal hematoma. Disc levels: Multilevel disc space narrowing and endplate spurring noted compatible with degenerative disc disease. Most advanced C5-6. Upper chest: Aortic atherosclerotic calcifications  noted Other: None IMPRESSION: 1. No acute intracranial abnormality. 2. Chronic small vessel ischemic disease and brain atrophy. 3. No evidence for facial bone fracture. 4. No evidence for cervical spine fracture or dislocation. 5. Cervical degenerative disc disease. 6. Aortic atherosclerosis. Aortic Atherosclerosis (ICD10-I70.0). Electronically Signed   By: Kerby Moors M.D.   On: 03/24/2020 06:07   CT Cervical Spine Wo Contrast  Result Date: 03/24/2020 CLINICAL DATA:  Facial trauma.  Fall.  Progressive confusion. EXAM: CT HEAD WITHOUT CONTRAST CT MAXILLOFACIAL WITHOUT CONTRAST CT CERVICAL SPINE WITHOUT CONTRAST TECHNIQUE: Multidetector CT imaging of the head, cervical spine, and maxillofacial structures were performed using the standard protocol without intravenous contrast. Multiplanar CT image reconstructions  of the cervical spine and maxillofacial structures were also generated. COMPARISON:  CT head 02/02/2019 FINDINGS: CT HEAD FINDINGS Brain: No evidence of acute infarction, hemorrhage, hydrocephalus, extra-axial collection or mass lesion/mass effect. Mild prominence of the sulci and ventricles. There is mild diffuse low-attenuation within the subcortical and periventricular white matter compatible with chronic microvascular disease. Vascular: No hyperdense vessel or unexpected calcification. Skull: Normal. Negative for fracture or focal lesion. Other: None. CT MAXILLOFACIAL FINDINGS Osseous: No fracture or mandibular dislocation. No destructive process. Orbits: Negative. No traumatic or inflammatory finding. Sinuses: Clear. Soft tissues: Negative. CT CERVICAL SPINE FINDINGS Alignment: Straightening of normal cervical lordosis. Skull base and vertebrae: No acute fracture. No primary bone lesion or focal pathologic process. Soft tissues and spinal canal: No prevertebral fluid or swelling. No visible canal hematoma. Disc levels: Multilevel disc space narrowing and endplate spurring noted compatible with  degenerative disc disease. Most advanced C5-6. Upper chest: Aortic atherosclerotic calcifications noted Other: None IMPRESSION: 1. No acute intracranial abnormality. 2. Chronic small vessel ischemic disease and brain atrophy. 3. No evidence for facial bone fracture. 4. No evidence for cervical spine fracture or dislocation. 5. Cervical degenerative disc disease. 6. Aortic atherosclerosis. Aortic Atherosclerosis (ICD10-I70.0). Electronically Signed   By: Kerby Moors M.D.   On: 03/24/2020 06:07   DG Chest Port 1 View  Result Date: 03/24/2020 CLINICAL DATA:  Weakness EXAM: PORTABLE CHEST 1 VIEW COMPARISON:  07/31/2015 FINDINGS: The heart size and mediastinal contours are within normal limits. Both lungs are clear. The visualized skeletal structures are unremarkable. IMPRESSION: No active disease. Electronically Signed   By: Ulyses Jarred M.D.   On: 03/24/2020 05:26   CT Maxillofacial WO CM  Result Date: 03/24/2020 CLINICAL DATA:  Facial trauma.  Fall.  Progressive confusion. EXAM: CT HEAD WITHOUT CONTRAST CT MAXILLOFACIAL WITHOUT CONTRAST CT CERVICAL SPINE WITHOUT CONTRAST TECHNIQUE: Multidetector CT imaging of the head, cervical spine, and maxillofacial structures were performed using the standard protocol without intravenous contrast. Multiplanar CT image reconstructions of the cervical spine and maxillofacial structures were also generated. COMPARISON:  CT head 02/02/2019 FINDINGS: CT HEAD FINDINGS Brain: No evidence of acute infarction, hemorrhage, hydrocephalus, extra-axial collection or mass lesion/mass effect. Mild prominence of the sulci and ventricles. There is mild diffuse low-attenuation within the subcortical and periventricular white matter compatible with chronic microvascular disease. Vascular: No hyperdense vessel or unexpected calcification. Skull: Normal. Negative for fracture or focal lesion. Other: None. CT MAXILLOFACIAL FINDINGS Osseous: No fracture or mandibular dislocation. No  destructive process. Orbits: Negative. No traumatic or inflammatory finding. Sinuses: Clear. Soft tissues: Negative. CT CERVICAL SPINE FINDINGS Alignment: Straightening of normal cervical lordosis. Skull base and vertebrae: No acute fracture. No primary bone lesion or focal pathologic process. Soft tissues and spinal canal: No prevertebral fluid or swelling. No visible canal hematoma. Disc levels: Multilevel disc space narrowing and endplate spurring noted compatible with degenerative disc disease. Most advanced C5-6. Upper chest: Aortic atherosclerotic calcifications noted Other: None IMPRESSION: 1. No acute intracranial abnormality. 2. Chronic small vessel ischemic disease and brain atrophy. 3. No evidence for facial bone fracture. 4. No evidence for cervical spine fracture or dislocation. 5. Cervical degenerative disc disease. 6. Aortic atherosclerosis. Aortic Atherosclerosis (ICD10-I70.0). Electronically Signed   By: Kerby Moors M.D.   On: 03/24/2020 06:07    EKG: Independently reviewed Atrial fibrillation Left axis deviation  Assessment/Plan Principal Problem:   Acute metabolic encephalopathy Active Problems:   Fall at home, initial encounter   Atrial fibrillation, chronic ([place])   Hypothyroid  GERD (gastroesophageal reflux disease)   AMS (altered mental status)   Bradycardia   Severe epistaxis     Acute metabolic encephalopathy Patient was brought to the ER by EMS for evaluation of change in mental status which family said has been going on for about 3 days prior to her presentation She was seen by her primary care provider and started empirically on Omnicef for presumed UTI, urine culture results are unavailable at this time At the time of my evaluation patient appears to be awake, alert and oriented to person place and time which is her baseline Will continue Rocephin 1 g IV daily for 3 days for presumed UTI Follow-up results of urine culture   Status post fall Patient had a  fall at home Urine analysis shows large blood Obtain total CK levels We will place on fall precautions We will request PT consult   History of chronic A. fib on anticoagulation Patient is on atenolol for rate control but is bradycardic Hold atenolol for now Hold Eliquis which patient is on as primary prophylaxis for an acute stroke due to severe epistaxis and recent fall We will consult cardiology to determine the need for continued anticoagulation therapy   Hypothyroidism Continue Synthroid   GERD Continue Synthroid    DVT prophylaxis: SCD Code Status: DO NOT RESUSCITATE Family Communication: Greater than 50% of time was spent discussing plan of care with patient's daughter at the bedside.  All questions and concerns have been addressed.  She verbalizes understanding and agrees to the plan.  CODE STATUS was discussed and patient is a DO NOT RESUSCITATE Disposition Plan: Back to previous home environment Consults called: Physical therapy    Swade Shonka MD [facility]     03/24/2020, 9:24 AM

## 2020-03-24 NOTE — ED Notes (Signed)
Cleaned up PT hands that were covered in blood from a nose bleed and repositioned perwick back in the correct place. Pt had removed it.

## 2020-03-25 ENCOUNTER — Encounter: Payer: Self-pay | Admitting: Internal Medicine

## 2020-03-25 DIAGNOSIS — W19XXXA Unspecified fall, initial encounter: Secondary | ICD-10-CM

## 2020-03-25 DIAGNOSIS — R040 Epistaxis: Secondary | ICD-10-CM

## 2020-03-25 DIAGNOSIS — Y92009 Unspecified place in unspecified non-institutional (private) residence as the place of occurrence of the external cause: Secondary | ICD-10-CM

## 2020-03-25 LAB — URINE CULTURE: Culture: NO GROWTH

## 2020-03-25 LAB — CBC
HCT: 29.7 % — ABNORMAL LOW (ref 36.0–46.0)
Hemoglobin: 9.5 g/dL — ABNORMAL LOW (ref 12.0–15.0)
MCH: 29.1 pg (ref 26.0–34.0)
MCHC: 32 g/dL (ref 30.0–36.0)
MCV: 90.8 fL (ref 80.0–100.0)
Platelets: 256 10*3/uL (ref 150–400)
RBC: 3.27 MIL/uL — ABNORMAL LOW (ref 3.87–5.11)
RDW: 14.2 % (ref 11.5–15.5)
WBC: 10.8 10*3/uL — ABNORMAL HIGH (ref 4.0–10.5)
nRBC: 0 % (ref 0.0–0.2)

## 2020-03-25 LAB — BASIC METABOLIC PANEL
Anion gap: 8 (ref 5–15)
BUN: 43 mg/dL — ABNORMAL HIGH (ref 8–23)
CO2: 28 mmol/L (ref 22–32)
Calcium: 8.8 mg/dL — ABNORMAL LOW (ref 8.9–10.3)
Chloride: 103 mmol/L (ref 98–111)
Creatinine, Ser: 0.89 mg/dL (ref 0.44–1.00)
GFR calc Af Amer: >60 mL/min (ref >60)
GFR calc non Af Amer: 57 mL/min — ABNORMAL LOW (ref >60)
Glucose, Bld: 133 mg/dL — ABNORMAL HIGH (ref 70–99)
Potassium: 4.2 mmol/L (ref 3.5–5.1)
Sodium: 139 mmol/L (ref 135–145)

## 2020-03-25 LAB — HEMOGLOBIN: Hemoglobin: 9.2 g/dL — ABNORMAL LOW (ref 12.0–15.0)

## 2020-03-25 NOTE — Progress Notes (Signed)
Patient was alert and oriented times 3 when she arrived on the unit. She became increasingly confused throughout the night, pulling off purewick and telemetry leads. Patient's nose is still bleeding out of right nare. I placed a wad of rolled up tissue to stop the bleeding. I pulled out 2 large clots while taking wad of tissue out of right nare.  Vitals are stable. Patient is in a low bed with alarm activated.  Will continue to monitor.  Christene Slates 03/25/2020  6:46 AM

## 2020-03-25 NOTE — Progress Notes (Addendum)
Progress Note    [place]  KPT:[id number] DOB: 1927-10-25  DOA: 03/24/2020 PCP: Derinda Late, MD      Brief Narrative:    Medical records reviewed and are as summarized below:  Jenny [name] is a 86 y.o. female       Assessment/Plan:   Principal Problem:   Acute metabolic encephalopathy Active Problems:   Fall at home, initial encounter   Atrial fibrillation, chronic (HCC)   Hypothyroid   GERD (gastroesophageal reflux disease)   AMS (altered mental status)   Bradycardia   Severe epistaxis  Body mass index is 28.19 kg/m.   Anemia, likely from acute blood loss from epistaxis    PLAN  Urinalysis was unremarkable.  No evidence of UTI. Discontinue IV Rocephin. Eliquis has been held because of epistaxis. Discontinue IV fluids because BP is elevated. Monitor heart rate off of atenolol. Monitor CBC. PT evaluation because of recent fall Follow-up with cardiologist.   Diet Order            Diet 2 gram sodium Room service appropriate? Yes; Fluid consistency: Thin  Diet effective now                    Consultants:  Cardiologist  Procedures:  None    Medications:   . atorvastatin  10 mg Oral Daily  . cholecalciferol  1,000 Units Oral Daily  . DULoxetine  120 mg Oral Daily  . levothyroxine  100 mcg Oral Q0600  . multivitamin with minerals  1 tablet Oral Daily  . pantoprazole  40 mg Oral BID   Continuous Infusions:    Anti-infectives (From admission, onward)   Start     Dose/Rate Route Frequency Ordered Stop   03/24/20 1000  cefTRIAXone (ROCEPHIN) 1 g in sodium chloride 0.9 % 100 mL IVPB  Status:  Discontinued        1 g 200 mL/hr over 30 Minutes Intravenous Every 24 hours 03/24/20 0902 03/25/20 0736             Family Communication/Anticipated D/C date and plan/Code Status   DVT prophylaxis: SCDs Start: 03/24/20 0857     Code Status: DNR  Family Communication: Plan discussed with patient Disposition  Plan:    Status is: Inpatient  Remains inpatient appropriate because:Inpatient level of care appropriate due to severity of illness and Intermittent nosebleeding    Dispo: The patient is from: Home              Anticipated d/c is to: Home              Anticipated d/c date is: 1 day              Patient currently is not medically stable to d/c.           Subjective:   Interval events noted.  She has had intermittent nose bleeding today.  Objective:    Vitals:   03/24/20 1958 03/25/20 0634 03/25/20 0806 03/25/20 1243  BP: (!) 161/86 (!) 155/82 (!) 157/88 (!) 153/81  Pulse: 68 94 (!) 103 85  Resp: 20 18 20 18   Temp: 97.7 F (36.5 C) 97.8 F (36.6 C) 97.6 F (36.4 C) 97.7 F (36.5 C)  TempSrc:  Oral Oral Oral  SpO2: 98% 98% 98% 99%  Weight:      Height:       No data found.   Intake/Output Summary (Last 24 hours) at 03/25/2020 1429 Last data filed  at 03/25/2020 1200 Gross per 24 hour  Intake 1418.5 ml  Output 800 ml  Net 618.5 ml   Filed Weights   03/24/20 0458  Weight: 81.6 kg    Exam:  GEN: NAD SKIN: Warm and dry EYES: EOMI ENT: MMM, no active nose bleeding CV: RRR PULM: CTA B ABD: soft, ND, NT, +BS CNS: AAO x 2 (person and place), non focal EXT: No edema or tenderness   Data Reviewed:   I have personally reviewed following labs and imaging studies:  Labs: Labs show the following:   Basic Metabolic Panel: Recent Labs  Lab 03/24/20 0507 03/25/20 0537  NA 136 139  K 4.5 4.2  CL 102 103  CO2 28 28  GLUCOSE 153* 133*  BUN 25* 43*  CREATININE 1.02* 0.89  CALCIUM 8.6* 8.8*   GFR Estimated Creatinine Clearance: 47.1 mL/min (by C-G formula based on SCr of 0.89 mg/dL). Liver Function Tests: Recent Labs  Lab 03/24/20 0507  AST 22  ALT 19  ALKPHOS 25*  BILITOT 0.5  PROT 6.6  ALBUMIN 3.6   No results for input(s): LIPASE, AMYLASE in the last 168 hours. No results for input(s): AMMONIA in the last 168 hours. Coagulation  profile No results for input(s): INR, PROTIME in the last 168 hours.  CBC: Recent Labs  Lab 03/24/20 0507 03/24/20 1853 03/25/20 0537  WBC 12.1*  --  10.8*  NEUTROABS 8.6*  --   --   HGB 11.6* 10.5* 9.5*  HCT 36.3  --  29.7*  MCV 91.7  --  90.8  PLT 269  --  256   Cardiac Enzymes: Recent Labs  Lab 03/24/20 0651  CKTOTAL 133   BNP (last 3 results) No results for input(s): PROBNP in the last 8760 hours. CBG: Recent Labs  Lab 03/24/20 0506  GLUCAP 143*   D-Dimer: No results for input(s): DDIMER in the last 72 hours. Hgb A1c: No results for input(s): HGBA1C in the last 72 hours. Lipid Profile: No results for input(s): CHOL, HDL, LDLCALC, TRIG, CHOLHDL, LDLDIRECT in the last 72 hours. Thyroid function studies: Recent Labs    03/24/20 0651  TSH 4.453   Anemia work up: No results for input(s): VITAMINB12, FOLATE, FERRITIN, TIBC, IRON, RETICCTPCT in the last 72 hours. Sepsis Labs: Recent Labs  Lab 03/24/20 0507 03/25/20 0537  WBC 12.1* 10.8*  LATICACIDVEN 1.4  --     Microbiology Recent Results (from the past 240 hour(s))  Urine culture     Status: None   Collection Time: 03/24/20  5:07 AM   Specimen: Urine, Random  Result Value Ref Range Status   Specimen Description   Final    URINE, RANDOM Performed at [facility], [address], [place], [place] [address]    Special Requests   Final    NONE Performed at [facility], [address], [place], [place] [address]    Culture   Final    NO GROWTH Performed at [facility], [place] [address] [place], [place] [address]    Report Status 03/25/2020 FINAL  Final  Culture, blood (routine x 2)     Status: None (Preliminary result)   Collection Time: 03/24/20  5:07 AM   Specimen: BLOOD  Result Value Ref Range Status   Specimen Description BLOOD BLOOD LEFT FOREARM  Final   Special Requests   Final    BOTTLES DRAWN AEROBIC AND ANAEROBIC Blood Culture results may not be optimal due to  an inadequate volume  of blood received in culture bottles   Culture   Final    NO GROWTH 1 DAY Performed at [facility], [address] [place], [place] [address]    Report Status PENDING  Incomplete  SARS Coronavirus 2 by RT PCR (hospital order, performed in [facility] hospital lab) Nasopharyngeal Nasopharyngeal Swab     Status: None   Collection Time: 03/24/20  5:07 AM   Specimen: Nasopharyngeal Swab  Result Value Ref Range Status   SARS Coronavirus 2 NEGATIVE NEGATIVE Final    Comment: (NOTE) SARS-CoV-2 target nucleic acids are NOT DETECTED.  The SARS-CoV-2 RNA is generally detectable in upper and lower respiratory specimens during the acute phase of infection. The lowest concentration of SARS-CoV-2 viral copies this assay can detect is 250 copies / mL. A negative result does not preclude SARS-CoV-2 infection and should not be used as the sole basis for treatment or other patient management decisions.  A negative result may occur with improper specimen collection / handling, submission of specimen other than nasopharyngeal swab, presence of viral mutation(s) within the areas targeted by this assay, and inadequate number of viral copies (<250 copies / mL). A negative result must be combined with clinical observations, patient history, and epidemiological information.  Fact Sheet for Patients:   StrictlyIdeas.no  Fact Sheet for Healthcare Providers: BankingDealers.co.za  This test is not yet approved or  cleared by the [place] FDA and has been authorized for detection and/or diagnosis of SARS-CoV-2 by FDA under an Emergency Use Authorization (EUA).  This EUA will remain in effect (meaning this test can be used) for the duration of the COVID-19 declaration under Section 564(b)(1) of the Act, 21 U.S.C. section 360bbb-3(b)(1), unless the authorization is terminated or revoked sooner.  Performed at [facility], [address] [place], [place] [address]   Culture, blood (routine x 2)     Status: None (Preliminary result)   Collection Time: 03/24/20  5:08 AM   Specimen: BLOOD  Result Value Ref Range Status   Specimen Description BLOOD BLOOD LEFT HAND  Final   Special Requests   Final    BOTTLES DRAWN AEROBIC AND ANAEROBIC Blood Culture results may not be optimal due to an inadequate volume of blood received in culture bottles   Culture   Final    NO GROWTH 1 DAY Performed at [facility], [address], [place], [place] [address]    Report Status PENDING  Incomplete    Procedures and diagnostic studies:  CT Head Wo Contrast  Result Date: 03/24/2020 CLINICAL DATA:  Facial trauma.  Fall.  Progressive confusion. EXAM: CT HEAD WITHOUT CONTRAST CT MAXILLOFACIAL WITHOUT CONTRAST CT CERVICAL SPINE WITHOUT CONTRAST TECHNIQUE: Multidetector CT imaging of the head, cervical spine, and maxillofacial structures were performed using the standard protocol without intravenous contrast. Multiplanar CT image reconstructions of the cervical spine and maxillofacial structures were also generated. COMPARISON:  CT head 02/02/2019 FINDINGS: CT HEAD FINDINGS Brain: No evidence of acute infarction, hemorrhage, hydrocephalus, extra-axial collection or mass lesion/mass effect. Mild prominence of the sulci and ventricles. There is mild diffuse low-attenuation within the subcortical and periventricular white matter compatible with chronic microvascular disease. Vascular: No hyperdense vessel or unexpected calcification. Skull: Normal. Negative for fracture or focal lesion. Other: None. CT MAXILLOFACIAL FINDINGS Osseous: No fracture or mandibular dislocation. No destructive process. Orbits: Negative. No traumatic or inflammatory finding. Sinuses: Clear. Soft tissues: Negative. CT CERVICAL SPINE FINDINGS Alignment: Straightening of normal cervical lordosis. Skull base and  vertebrae: No acute fracture. No primary  bone lesion or focal pathologic process. Soft tissues and spinal canal: No prevertebral fluid or swelling. No visible canal hematoma. Disc levels: Multilevel disc space narrowing and endplate spurring noted compatible with degenerative disc disease. Most advanced C5-6. Upper chest: Aortic atherosclerotic calcifications noted Other: None IMPRESSION: 1. No acute intracranial abnormality. 2. Chronic small vessel ischemic disease and brain atrophy. 3. No evidence for facial bone fracture. 4. No evidence for cervical spine fracture or dislocation. 5. Cervical degenerative disc disease. 6. Aortic atherosclerosis. Aortic Atherosclerosis (ICD10-I70.0). Electronically Signed   By: Kerby Moors M.D.   On: 03/24/2020 06:07   CT Cervical Spine Wo Contrast  Result Date: 03/24/2020 CLINICAL DATA:  Facial trauma.  Fall.  Progressive confusion. EXAM: CT HEAD WITHOUT CONTRAST CT MAXILLOFACIAL WITHOUT CONTRAST CT CERVICAL SPINE WITHOUT CONTRAST TECHNIQUE: Multidetector CT imaging of the head, cervical spine, and maxillofacial structures were performed using the standard protocol without intravenous contrast. Multiplanar CT image reconstructions of the cervical spine and maxillofacial structures were also generated. COMPARISON:  CT head 02/02/2019 FINDINGS: CT HEAD FINDINGS Brain: No evidence of acute infarction, hemorrhage, hydrocephalus, extra-axial collection or mass lesion/mass effect. Mild prominence of the sulci and ventricles. There is mild diffuse low-attenuation within the subcortical and periventricular white matter compatible with chronic microvascular disease. Vascular: No hyperdense vessel or unexpected calcification. Skull: Normal. Negative for fracture or focal lesion. Other: None. CT MAXILLOFACIAL FINDINGS Osseous: No fracture or mandibular dislocation. No destructive process. Orbits: Negative. No traumatic or inflammatory finding. Sinuses: Clear. Soft tissues: Negative. CT CERVICAL SPINE FINDINGS Alignment:  Straightening of normal cervical lordosis. Skull base and vertebrae: No acute fracture. No primary bone lesion or focal pathologic process. Soft tissues and spinal canal: No prevertebral fluid or swelling. No visible canal hematoma. Disc levels: Multilevel disc space narrowing and endplate spurring noted compatible with degenerative disc disease. Most advanced C5-6. Upper chest: Aortic atherosclerotic calcifications noted Other: None IMPRESSION: 1. No acute intracranial abnormality. 2. Chronic small vessel ischemic disease and brain atrophy. 3. No evidence for facial bone fracture. 4. No evidence for cervical spine fracture or dislocation. 5. Cervical degenerative disc disease. 6. Aortic atherosclerosis. Aortic Atherosclerosis (ICD10-I70.0). Electronically Signed   By: Kerby Moors M.D.   On: 03/24/2020 06:07   DG Chest Port 1 View  Result Date: 03/24/2020 CLINICAL DATA:  Weakness EXAM: PORTABLE CHEST 1 VIEW COMPARISON:  07/31/2015 FINDINGS: The heart size and mediastinal contours are within normal limits. Both lungs are clear. The visualized skeletal structures are unremarkable. IMPRESSION: No active disease. Electronically Signed   By: Ulyses Jarred M.D.   On: 03/24/2020 05:26   CT Maxillofacial WO CM  Result Date: 03/24/2020 CLINICAL DATA:  Facial trauma.  Fall.  Progressive confusion. EXAM: CT HEAD WITHOUT CONTRAST CT MAXILLOFACIAL WITHOUT CONTRAST CT CERVICAL SPINE WITHOUT CONTRAST TECHNIQUE: Multidetector CT imaging of the head, cervical spine, and maxillofacial structures were performed using the standard protocol without intravenous contrast. Multiplanar CT image reconstructions of the cervical spine and maxillofacial structures were also generated. COMPARISON:  CT head 02/02/2019 FINDINGS: CT HEAD FINDINGS Brain: No evidence of acute infarction, hemorrhage, hydrocephalus, extra-axial collection or mass lesion/mass effect. Mild prominence of the sulci and ventricles. There is mild diffuse  low-attenuation within the subcortical and periventricular white matter compatible with chronic microvascular disease. Vascular: No hyperdense vessel or unexpected calcification. Skull: Normal. Negative for fracture or focal lesion. Other: None. CT MAXILLOFACIAL FINDINGS Osseous: No fracture or mandibular dislocation. No destructive process. Orbits: Negative. No  traumatic or inflammatory finding. Sinuses: Clear. Soft tissues: Negative. CT CERVICAL SPINE FINDINGS Alignment: Straightening of normal cervical lordosis. Skull base and vertebrae: No acute fracture. No primary bone lesion or focal pathologic process. Soft tissues and spinal canal: No prevertebral fluid or swelling. No visible canal hematoma. Disc levels: Multilevel disc space narrowing and endplate spurring noted compatible with degenerative disc disease. Most advanced C5-6. Upper chest: Aortic atherosclerotic calcifications noted Other: None IMPRESSION: 1. No acute intracranial abnormality. 2. Chronic small vessel ischemic disease and brain atrophy. 3. No evidence for facial bone fracture. 4. No evidence for cervical spine fracture or dislocation. 5. Cervical degenerative disc disease. 6. Aortic atherosclerosis. Aortic Atherosclerosis (ICD10-I70.0). Electronically Signed   By: Kerby Moors M.D.   On: 03/24/2020 06:07               LOS: 1 day   Aristidis Talerico  [facility]   Pager on [email or web address]. If 7PM-7AM, please contact night-coverage at [email or web address]     03/25/2020, 2:29 PM

## 2020-03-25 NOTE — Progress Notes (Signed)
Patient Name: Jenny [name] Date of Encounter: 03/25/2020  Hospital Problem List     Principal Problem:   Acute metabolic encephalopathy Active Problems:   Fall at home, initial encounter   Atrial fibrillation, chronic (HCC)   Hypothyroid   GERD (gastroesophageal reflux disease)   AMS (altered mental status)   Bradycardia   Severe epistaxis    Patient Profile     86 y.o. female with history of paroxsymal atrial fibrillations/p cardioversion, anticoagulated with Eliquis, history of CVA, and hyperlipidemia who presented to emergency room with parent altered mental status and a fall.  Patient found sitting on the floor.  Patient had blood from her nose.  Patient is not able to say whether she hit her head when she fell.  She apparently has been increasingly confused over the past several days.  She is on Eliquis for anticoagulation as mentioned previously.  Cervical spine and head CT showed no acute process other than small vessel ischemic disease and brain atrophy.  Hemoglobin is 11.6 creatinine 1.02.  EKG showed A. fib at a rate of 58.  Chest x-ray showed no acute cardiopulmonary disease.   Subjective   Less confused this am. Still with nose bleed  Inpatient Medications     atorvastatin  10 mg Oral Daily   cholecalciferol  1,000 Units Oral Daily   DULoxetine  120 mg Oral Daily   levothyroxine  100 mcg Oral Q0600   multivitamin with minerals  1 tablet Oral Daily   pantoprazole  40 mg Oral BID    Vital Signs    Vitals:   03/24/20 1958 03/25/20 0634 03/25/20 0806 03/25/20 1243  BP: (!) 161/86 (!) 155/82 (!) 157/88 (!) 153/81  Pulse: 68 94 (!) 103 85  Resp: 20 18 20 18   Temp: 97.7 F (36.5 C) 97.8 F (36.6 C) 97.6 F (36.4 C) 97.7 F (36.5 C)  TempSrc:  Oral Oral Oral  SpO2: 98% 98% 98% 99%  Weight:      Height:        Intake/Output Summary (Last 24 hours) at 03/25/2020 1333 Last data filed at 03/25/2020 1200 Gross per 24 hour  Intake 1418.5 ml  Output  800 ml  Net 618.5 ml   Filed Weights   03/24/20 0458  Weight: 81.6 kg    Physical Exam    GEN: Well nourished, well developed, in no acute distress.  HEENT: normal.  Neck: Supple, no JVD, carotid bruits, or masses.Continues to have a nose bleed Cardiac: irr, irr Respiratory:  Respirations regular and unlabored, clear to auscultation bilaterally. GI: Soft, nontender, nondistended, BS + x 4. MS: no deformity or atrophy. Skin: warm and dry, no rash. Neuro:  Strength and sensation are intact. Psych: Normal affect.  Labs    CBC Recent Labs    03/24/20 0507 03/24/20 0507 03/24/20 1853 03/25/20 0537  WBC 12.1*  --   --  10.8*  NEUTROABS 8.6*  --   --   --   HGB 11.6*   < > 10.5* 9.5*  HCT 36.3  --   --  29.7*  MCV 91.7  --   --  90.8  PLT 269  --   --  256   < > = values in this interval not displayed.   Basic Metabolic Panel Recent Labs    03/24/20 0507 03/25/20 0537  NA 136 139  K 4.5 4.2  CL 102 103  CO2 28 28  GLUCOSE 153* 133*  BUN 25* 43*  CREATININE 1.02* 0.89  CALCIUM 8.6* 8.8*   Liver Function Tests Recent Labs    03/24/20 0507  AST 22  ALT 19  ALKPHOS 25*  BILITOT 0.5  PROT 6.6  ALBUMIN 3.6   No results for input(s): LIPASE, AMYLASE in the last 72 hours. Cardiac Enzymes Recent Labs    03/24/20 0651  CKTOTAL 133   BNP No results for input(s): BNP in the last 72 hours. D-Dimer No results for input(s): DDIMER in the last 72 hours. Hemoglobin A1C No results for input(s): HGBA1C in the last 72 hours. Fasting Lipid Panel No results for input(s): CHOL, HDL, LDLCALC, TRIG, CHOLHDL, LDLDIRECT in the last 72 hours. Thyroid Function Tests Recent Labs    03/24/20 0651  TSH 4.453    Telemetry    afib with controlled vr  ECG    afib  Radiology    CT Head Wo Contrast  Result Date: 03/24/2020 CLINICAL DATA:  Facial trauma.  Fall.  Progressive confusion. EXAM: CT HEAD WITHOUT CONTRAST CT MAXILLOFACIAL WITHOUT CONTRAST CT CERVICAL  SPINE WITHOUT CONTRAST TECHNIQUE: Multidetector CT imaging of the head, cervical spine, and maxillofacial structures were performed using the standard protocol without intravenous contrast. Multiplanar CT image reconstructions of the cervical spine and maxillofacial structures were also generated. COMPARISON:  CT head 02/02/2019 FINDINGS: CT HEAD FINDINGS Brain: No evidence of acute infarction, hemorrhage, hydrocephalus, extra-axial collection or mass lesion/mass effect. Mild prominence of the sulci and ventricles. There is mild diffuse low-attenuation within the subcortical and periventricular white matter compatible with chronic microvascular disease. Vascular: No hyperdense vessel or unexpected calcification. Skull: Normal. Negative for fracture or focal lesion. Other: None. CT MAXILLOFACIAL FINDINGS Osseous: No fracture or mandibular dislocation. No destructive process. Orbits: Negative. No traumatic or inflammatory finding. Sinuses: Clear. Soft tissues: Negative. CT CERVICAL SPINE FINDINGS Alignment: Straightening of normal cervical lordosis. Skull base and vertebrae: No acute fracture. No primary bone lesion or focal pathologic process. Soft tissues and spinal canal: No prevertebral fluid or swelling. No visible canal hematoma. Disc levels: Multilevel disc space narrowing and endplate spurring noted compatible with degenerative disc disease. Most advanced C5-6. Upper chest: Aortic atherosclerotic calcifications noted Other: None IMPRESSION: 1. No acute intracranial abnormality. 2. Chronic small vessel ischemic disease and brain atrophy. 3. No evidence for facial bone fracture. 4. No evidence for cervical spine fracture or dislocation. 5. Cervical degenerative disc disease. 6. Aortic atherosclerosis. Aortic Atherosclerosis (ICD10-I70.0). Electronically Signed   By: Kerby Moors M.D.   On: 03/24/2020 06:07   CT Cervical Spine Wo Contrast  Result Date: 03/24/2020 CLINICAL DATA:  Facial trauma.  Fall.   Progressive confusion. EXAM: CT HEAD WITHOUT CONTRAST CT MAXILLOFACIAL WITHOUT CONTRAST CT CERVICAL SPINE WITHOUT CONTRAST TECHNIQUE: Multidetector CT imaging of the head, cervical spine, and maxillofacial structures were performed using the standard protocol without intravenous contrast. Multiplanar CT image reconstructions of the cervical spine and maxillofacial structures were also generated. COMPARISON:  CT head 02/02/2019 FINDINGS: CT HEAD FINDINGS Brain: No evidence of acute infarction, hemorrhage, hydrocephalus, extra-axial collection or mass lesion/mass effect. Mild prominence of the sulci and ventricles. There is mild diffuse low-attenuation within the subcortical and periventricular white matter compatible with chronic microvascular disease. Vascular: No hyperdense vessel or unexpected calcification. Skull: Normal. Negative for fracture or focal lesion. Other: None. CT MAXILLOFACIAL FINDINGS Osseous: No fracture or mandibular dislocation. No destructive process. Orbits: Negative. No traumatic or inflammatory finding. Sinuses: Clear. Soft tissues: Negative. CT CERVICAL SPINE FINDINGS Alignment: Straightening of normal cervical lordosis. Skull base and  vertebrae: No acute fracture. No primary bone lesion or focal pathologic process. Soft tissues and spinal canal: No prevertebral fluid or swelling. No visible canal hematoma. Disc levels: Multilevel disc space narrowing and endplate spurring noted compatible with degenerative disc disease. Most advanced C5-6. Upper chest: Aortic atherosclerotic calcifications noted Other: None IMPRESSION: 1. No acute intracranial abnormality. 2. Chronic small vessel ischemic disease and brain atrophy. 3. No evidence for facial bone fracture. 4. No evidence for cervical spine fracture or dislocation. 5. Cervical degenerative disc disease. 6. Aortic atherosclerosis. Aortic Atherosclerosis (ICD10-I70.0). Electronically Signed   By: Kerby Moors M.D.   On: 03/24/2020 06:07    DG Chest Port 1 View  Result Date: 03/24/2020 CLINICAL DATA:  Weakness EXAM: PORTABLE CHEST 1 VIEW COMPARISON:  07/31/2015 FINDINGS: The heart size and mediastinal contours are within normal limits. Both lungs are clear. The visualized skeletal structures are unremarkable. IMPRESSION: No active disease. Electronically Signed   By: Ulyses Jarred M.D.   On: 03/24/2020 05:26   CT Maxillofacial WO CM  Result Date: 03/24/2020 CLINICAL DATA:  Facial trauma.  Fall.  Progressive confusion. EXAM: CT HEAD WITHOUT CONTRAST CT MAXILLOFACIAL WITHOUT CONTRAST CT CERVICAL SPINE WITHOUT CONTRAST TECHNIQUE: Multidetector CT imaging of the head, cervical spine, and maxillofacial structures were performed using the standard protocol without intravenous contrast. Multiplanar CT image reconstructions of the cervical spine and maxillofacial structures were also generated. COMPARISON:  CT head 02/02/2019 FINDINGS: CT HEAD FINDINGS Brain: No evidence of acute infarction, hemorrhage, hydrocephalus, extra-axial collection or mass lesion/mass effect. Mild prominence of the sulci and ventricles. There is mild diffuse low-attenuation within the subcortical and periventricular white matter compatible with chronic microvascular disease. Vascular: No hyperdense vessel or unexpected calcification. Skull: Normal. Negative for fracture or focal lesion. Other: None. CT MAXILLOFACIAL FINDINGS Osseous: No fracture or mandibular dislocation. No destructive process. Orbits: Negative. No traumatic or inflammatory finding. Sinuses: Clear. Soft tissues: Negative. CT CERVICAL SPINE FINDINGS Alignment: Straightening of normal cervical lordosis. Skull base and vertebrae: No acute fracture. No primary bone lesion or focal pathologic process. Soft tissues and spinal canal: No prevertebral fluid or swelling. No visible canal hematoma. Disc levels: Multilevel disc space narrowing and endplate spurring noted compatible with degenerative disc disease. Most  advanced C5-6. Upper chest: Aortic atherosclerotic calcifications noted Other: None IMPRESSION: 1. No acute intracranial abnormality. 2. Chronic small vessel ischemic disease and brain atrophy. 3. No evidence for facial bone fracture. 4. No evidence for cervical spine fracture or dislocation. 5. Cervical degenerative disc disease. 6. Aortic atherosclerosis. Aortic Atherosclerosis (ICD10-I70.0). Electronically Signed   By: Kerby Moors M.D.   On: 03/24/2020 06:07    Assessment & Plan    86 y.o. female with history of paroxsymal atrial fibrillations/p cardioversion, anticoagulated with Eliquis, history of CVA, and hyperlipidemia who presented to emergency room with parent altered mental status and a fall.  Patient found sitting on the floor.  Patient had blood from her nose.  Patient is not able to say whether she hit her head when she fell.  She apparently has been increasingly confused over the past several days.  She is on Eliquis for anticoagulation as mentioned previously.  Cervical spine and head CT showed no acute process other than small vessel ischemic disease and brain atrophy.  Hemoglobin is 11.6 creatinine 1.02.  EKG showed A. fib at a rate of 58.  Chest x-ray showed no acute cardiopulmonary disease.  1.  Atrial fibrillation-CHA2DS2-VASc score is at least 27 in an 86 year old female with hypertension.  Rate is well controlled at present.  Would remain off atenolol following heart rate.  Patient has been on Eliquis but given falls, severe epistaxis and instability will need to discontinue this at least for now following course.Will need consideration for ent evaluation as outpatient to determine source of bleed.   2.  Altered mental status-no acute process on brain CT. Etiology unclear. Improved this am.  3. Hypertension-more stale this am. Hopefully will be able for discharge in am if bleeding improves.    Signed, Javier Docker Walta [place] MD 03/25/2020, 1:33 PM  Pager: [phone]

## 2020-03-25 NOTE — Plan of Care (Signed)
Continuing with plan of care. 

## 2020-03-26 ENCOUNTER — Inpatient Hospital Stay: Payer: Medicare Other

## 2020-03-26 LAB — CBC WITH DIFFERENTIAL/PLATELET
Abs Immature Granulocytes: 0.07 10*3/uL (ref 0.00–0.07)
Basophils Absolute: 0.1 10*3/uL (ref 0.0–0.1)
Basophils Relative: 1 %
Eosinophils Absolute: 0.3 10*3/uL (ref 0.0–0.5)
Eosinophils Relative: 3 %
HCT: 26.4 % — ABNORMAL LOW (ref 36.0–46.0)
Hemoglobin: 8.9 g/dL — ABNORMAL LOW (ref 12.0–15.0)
Immature Granulocytes: 1 %
Lymphocytes Relative: 20 %
Lymphs Abs: 2.7 10*3/uL (ref 0.7–4.0)
MCH: 29.7 pg (ref 26.0–34.0)
MCHC: 33.7 g/dL (ref 30.0–36.0)
MCV: 88 fL (ref 80.0–100.0)
Monocytes Absolute: 1.3 10*3/uL — ABNORMAL HIGH (ref 0.1–1.0)
Monocytes Relative: 10 %
Neutro Abs: 8.8 10*3/uL — ABNORMAL HIGH (ref 1.7–7.7)
Neutrophils Relative %: 65 %
Platelets: 238 10*3/uL (ref 150–400)
RBC: 3 MIL/uL — ABNORMAL LOW (ref 3.87–5.11)
RDW: 14.6 % (ref 11.5–15.5)
WBC: 13.3 10*3/uL — ABNORMAL HIGH (ref 4.0–10.5)
nRBC: 0 % (ref 0.0–0.2)

## 2020-03-26 LAB — VITAMIN B12: Vitamin B-12: 325 pg/mL (ref 180–914)

## 2020-03-26 LAB — IRON AND TIBC
Iron: 57 ug/dL (ref 28–170)
Saturation Ratios: 21 % (ref 10.4–31.8)
TIBC: 266 ug/dL (ref 250–450)
UIBC: 209 ug/dL

## 2020-03-26 LAB — HEMOGLOBIN AND HEMATOCRIT, BLOOD
HCT: 28.2 % — ABNORMAL LOW (ref 36.0–46.0)
Hemoglobin: 9.2 g/dL — ABNORMAL LOW (ref 12.0–15.0)

## 2020-03-26 LAB — FERRITIN: Ferritin: 35 ng/mL (ref 11–307)

## 2020-03-26 MED ORDER — SODIUM CHLORIDE 0.9 % IV BOLUS
500.0000 mL | Freq: Once | INTRAVENOUS | Status: AC
  Administered 2020-03-26: 500 mL via INTRAVENOUS

## 2020-03-26 NOTE — Progress Notes (Signed)
Progress Note    [place]i  ZOX:[id number] DOB: 1928/02/22  DOA: 03/24/2020 PCP: Derinda Late, MD      Brief Narrative:    Medical records reviewed and are as summarized below:  [place] Yepez is a 86 y.o. female       Assessment/Plan:   Principal Problem:   Acute metabolic encephalopathy Active Problems:   Fall at home, initial encounter   Atrial fibrillation, chronic (HCC)   Hypothyroid   GERD (gastroesophageal reflux disease)   AMS (altered mental status)   Bradycardia   Severe epistaxis  Body mass index is 28.19 kg/m.   Anemia, likely from acute blood loss from epistaxis Hypotension   PLAN  Ordered IV normal saline bolus for low blood pressure Obtain CT head for further evaluation. Eliquis and atenolol will be held at discharge given epistaxis and recent bradycardia and hypotension respectively. Repeat H&H because of recent epistaxis and blood loss anemia PT recommended home health therapy Outpatient follow-up with cardiologist and anticoagulation will be addressed at at that time. Plan of care was discussed with her daughters Almyra Free at the bedside and Diane over the phone).   Diet Order            Diet - low sodium heart healthy           Diet 2 gram sodium Room service appropriate? Yes; Fluid consistency: Thin  Diet effective now                    Consultants:  Cardiologist  Procedures:  None    Medications:   . atorvastatin  10 mg Oral Daily  . cholecalciferol  1,000 Units Oral Daily  . DULoxetine  120 mg Oral Daily  . levothyroxine  100 mcg Oral Q0600  . multivitamin with minerals  1 tablet Oral Daily  . pantoprazole  40 mg Oral BID   Continuous Infusions: . sodium chloride       Anti-infectives (From admission, onward)   Start     Dose/Rate Route Frequency Ordered Stop   03/24/20 1000  cefTRIAXone (ROCEPHIN) 1 g in sodium chloride 0.9 % 100 mL IVPB  Status:  Discontinued        1 g 200 mL/hr over  30 Minutes Intravenous Every 24 hours 03/24/20 0902 03/25/20 0736             Family Communication/Anticipated D/C date and plan/Code Status   DVT prophylaxis: SCDs Start: 03/24/20 0857     Code Status: DNR  Family Communication: Plan discussed with patient Disposition Plan:    Status is: Inpatient  Remains inpatient appropriate because:Inpatient level of care appropriate due to severity of illness and Intermittent nosebleeding    Dispo: The patient is from: Home              Anticipated d/c is to: Home              Anticipated d/c date is: 1 day              Patient currently is not medically stable to d/c.           Subjective:   Interval events noted.  Patient was completely awake this morning when I did my morning rounds.  Almyra Free, her daughter, was at the bedside at that time.  Later participated in physical therapy and home health therapy was recommended.  Plan was to discharge the patient home today.  Later in the day, I  was notified by Danae Chen, RN, that patient had become lethargic/less responsive.  Objective:    Vitals:   03/26/20 0546 03/26/20 1120 03/26/20 1340 03/26/20 1344  BP: 125/64 (!) 93/50 (!) 105/45 100/64  Pulse: 78 70 84 72  Resp: 20 17 20    Temp: (!) 97.5 F (36.4 C) 98.4 F (36.9 C)    TempSrc: Oral     SpO2:  92%    Weight:      Height:       No data found.   Intake/Output Summary (Last 24 hours) at 03/26/2020 1415 Last data filed at 03/26/2020 0551 Gross per 24 hour  Intake 236.24 ml  Output 1650 ml  Net -1413.76 ml   Filed Weights   03/24/20 0458  Weight: 81.6 kg    Exam:  GEN: NAD SKIN: Warm and dry EYES: EOMI, PERRLA ENT: MMM CV: RRR PULM: CTA B ABD: soft, ND, NT, +BS CNS: Lethargic and difficult to arouse.  Unable to participate in full neurological exam.  Hypotonia in left upper extremity. EXT: No edema or tenderness    Data Reviewed:   I have personally reviewed following labs and imaging  studies:  Labs: Labs show the following:   Basic Metabolic Panel: Recent Labs  Lab 03/24/20 0507 03/25/20 0537  NA 136 139  K 4.5 4.2  CL 102 103  CO2 28 28  GLUCOSE 153* 133*  BUN 25* 43*  CREATININE 1.02* 0.89  CALCIUM 8.6* 8.8*   GFR Estimated Creatinine Clearance: 47.1 mL/min (by C-G formula based on SCr of 0.89 mg/dL). Liver Function Tests: Recent Labs  Lab 03/24/20 0507  AST 22  ALT 19  ALKPHOS 25*  BILITOT 0.5  PROT 6.6  ALBUMIN 3.6   No results for input(s): LIPASE, AMYLASE in the last 168 hours. No results for input(s): AMMONIA in the last 168 hours. Coagulation profile No results for input(s): INR, PROTIME in the last 168 hours.  CBC: Recent Labs  Lab 03/24/20 0507 03/24/20 1853 03/25/20 0537 03/25/20 1644 03/26/20 0551  WBC 12.1*  --  10.8*  --  13.3*  NEUTROABS 8.6*  --   --   --  8.8*  HGB 11.6* 10.5* 9.5* 9.2* 8.9*  HCT 36.3  --  29.7*  --  26.4*  MCV 91.7  --  90.8  --  88.0  PLT 269  --  256  --  238   Cardiac Enzymes: Recent Labs  Lab 03/24/20 0651  CKTOTAL 133   BNP (last 3 results) No results for input(s): PROBNP in the last 8760 hours. CBG: Recent Labs  Lab 03/24/20 0506  GLUCAP 143*   D-Dimer: No results for input(s): DDIMER in the last 72 hours. Hgb A1c: No results for input(s): HGBA1C in the last 72 hours. Lipid Profile: No results for input(s): CHOL, HDL, LDLCALC, TRIG, CHOLHDL, LDLDIRECT in the last 72 hours. Thyroid function studies: Recent Labs    03/24/20 0651  TSH 4.453   Anemia work up: Recent Labs    03/26/20 0537 03/26/20 0551  VITAMINB12 325  --   FERRITIN  --  35  TIBC  --  266  IRON  --  57   Sepsis Labs: Recent Labs  Lab 03/24/20 0507 03/25/20 0537 03/26/20 0551  WBC 12.1* 10.8* 13.3*  LATICACIDVEN 1.4  --   --     Microbiology Recent Results (from the past 240 hour(s))  Urine culture     Status: None   Collection Time: 03/24/20  5:07 AM  Specimen: Urine, Random  Result Value  Ref Range Status   Specimen Description   Final    URINE, RANDOM Performed at [facility], [address], [place], [place] [address]    Special Requests   Final    NONE Performed at [facility], [address] [place], [place] [address]    Culture   Final    NO GROWTH Performed at [facility], [place] [address], [place], [place] [address]    Report Status 03/25/2020 FINAL  Final  Culture, blood (routine x 2)     Status: None (Preliminary result)   Collection Time: 03/24/20  5:07 AM   Specimen: BLOOD  Result Value Ref Range Status   Specimen Description BLOOD BLOOD LEFT FOREARM  Final   Special Requests   Final    BOTTLES DRAWN AEROBIC AND ANAEROBIC Blood Culture results may not be optimal due to an inadequate volume of blood received in culture bottles   Culture   Final    NO GROWTH 2 DAYS Performed at [facility], [address], [place], [place] [address]    Report Status PENDING  Incomplete  SARS Coronavirus 2 by RT PCR (hospital order, performed in [facility] hospital lab) Nasopharyngeal Nasopharyngeal Swab     Status: None   Collection Time: 03/24/20  5:07 AM   Specimen: Nasopharyngeal Swab  Result Value Ref Range Status   SARS Coronavirus 2 NEGATIVE NEGATIVE Final    Comment: (NOTE) SARS-CoV-2 target nucleic acids are NOT DETECTED.  The SARS-CoV-2 RNA is generally detectable in upper and lower respiratory specimens during the acute phase of infection. The lowest concentration of SARS-CoV-2 viral copies this assay can detect is 250 copies / mL. A negative result does not preclude SARS-CoV-2 infection and should not be used as the sole basis for treatment or other patient management decisions.  A negative result may occur with improper specimen collection / handling, submission of specimen other than nasopharyngeal swab, presence of viral mutation(s) within the areas targeted by this assay, and inadequate number of viral  copies (<250 copies / mL). A negative result must be combined with clinical observations, patient history, and epidemiological information.  Fact Sheet for Patients:   StrictlyIdeas.no  Fact Sheet for Healthcare Providers: BankingDealers.co.za  This test is not yet approved or  cleared by the [place] FDA and has been authorized for detection and/or diagnosis of SARS-CoV-2 by FDA under an Emergency Use Authorization (EUA).  This EUA will remain in effect (meaning this test can be used) for the duration of the COVID-19 declaration under Section 564(b)(1) of the Act, 21 U.S.C. section 360bbb-3(b)(1), unless the authorization is terminated or revoked sooner.  Performed at [facility], [address] [place], [place] [address]   Culture, blood (routine x 2)     Status: None (Preliminary result)   Collection Time: 03/24/20  5:08 AM   Specimen: BLOOD  Result Value Ref Range Status   Specimen Description BLOOD BLOOD LEFT HAND  Final   Special Requests   Final    BOTTLES DRAWN AEROBIC AND ANAEROBIC Blood Culture results may not be optimal due to an inadequate volume of blood received in culture bottles   Culture   Final    NO GROWTH 2 DAYS Performed at [facility], [address] [place],  [address]    Report Status PENDING  Incomplete    Procedures and diagnostic studies:  No results found.  LOS: 2 days   Chandy Tarman  [facility]   Pager on [email or web address]. If 7PM-7AM, please contact night-coverage at [email or web address]     03/26/2020, 2:15 PM

## 2020-03-26 NOTE — Progress Notes (Signed)
Physical Therapy Treatment Patient Details Name: Jenny [name] MRN: [id number] DOB: [birth year] Today's Date: 03/26/2020    History of Present Illness Per MD notes:Jenny [name] is a 86 y.o. female with medical history significant for atrial fibrillation on chronic anticoagulation therapy, hypertension, hyperparathyroidism who was brought into the ER via EMS for evaluation of change in mental status and a fall.  Family had found patient sitting next to a toilet after they heard a loud thud, EMS was called and patient was found with dried blood on her nose.  There was a copious amount of blood in the commode and patient's daughter states that she has frequent nosebleeds.  Patient does not remember if she hit her head when she fell.  Family states that she has been increasingly confused over the last 24 to 48 hours.    PT Comments    On arrival to room, pt was sitting on EOB with CNA about to use bedside commode. Once pt was finished, she needed to lay back down into bed due to fatigue. She was minA+1 for all bed mobility as she needs minimal assistance with UE and LE to get on/off the bed. During transfers, she was modA+1 to stand as she needed assistance to help stand up straight. Pt stood total of 3 times. The first time, she needed to sit immediately due to dizziness and fatigue. She layed sidelying in bed to relax and cool down. The second time, she was able to take a 3 steps with RW, but needed to side down again. The third time, she rolled to the L and sat EOB on the side closest to chair, and able to walk 2 feet to the recliner chair. VCs utilized throughout for proper hand placement and for RW safety. Monitored vitals throughout. HR ranged from 95-121 with exertion and came down to 84 with rest. O2 ranged from 91-100. Attempted to check BP, but not a reading from machine. Her daughter was in the room and notes that she is worse today than previous days. Pt left in the chair with all needs.  She will benefit from continued skilled PT in order to improve mobility and strength.   Follow Up Recommendations  Home health PT     Equipment Recommendations  None recommended by PT    Recommendations for Other Services       Precautions / Restrictions Precautions Precautions: Fall Restrictions Weight Bearing Restrictions: No    Mobility  Bed Mobility Overal bed mobility: Needs Assistance Bed Mobility: Rolling;Sidelying to Sit;Sit to Sidelying Rolling: Min assist Sidelying to sit: Min assist     Sit to sidelying: Min assist General bed mobility comments: Pt is min A + 1 for all bed mobility as she needed help with LE and UE support. VCs utilized throughout for proper hand and leg placement. Pt uses bed rails and takes extra time during bed mobility.  Transfers Overall transfer level: Needs assistance Equipment used: Rolling walker (2 wheeled) Transfers: Sit to/from Stand Sit to Stand: Mod assist         General transfer comment: Pt is mod A + 1 for sit to stand transfer as she needs help standing up straight. VCs used for proper hand placement  Ambulation/Gait Ambulation/Gait assistance: Min guard Gait Distance (Feet): 5 Feet Assistive device: Rolling walker (2 wheeled) Gait Pattern/deviations: Step-through pattern;Decreased step length - right;Decreased step length - left     General Gait Details: Pt requires VCs for proper use of walker. She attempted to  walk around her bed to the chair, but after a couple of steps, needed to sit down.   Stairs             Wheelchair Mobility    Modified Rankin (Stroke Patients Only)       Balance Overall balance assessment: Needs assistance Sitting-balance support: Feet supported Sitting balance-Leahy Scale: Fair Sitting balance - Comments: When sitting EOB, pt has good balance until she gets tired and starts to lean to her L Postural control: Left lateral lean Standing balance support: Bilateral upper  extremity supported Standing balance-Leahy Scale: Fair Standing balance comment: Once standing, pt has good balance with BUE supported on RW                            Cognition Arousal/Alertness: Awake/alert;Lethargic (Awake/alert with movement, but lethargic when fatigued) Behavior During Therapy: WFL for tasks assessed/performed;Flat affect Overall Cognitive Status: Within Functional Limits for tasks assessed                                 General Comments: Pt is HOH, but daughter was in room to help with communication      Exercises Other Exercises Other Exercises: Sit to stand x 3 times    General Comments General comments (skin integrity, edema, etc.): Pt used the bathroom and her daughter reports she gets hot and clammy after use, which contributed to dizziness/fatigue/lethargy of session.      Pertinent Vitals/Pain Pain Assessment: No/denies pain    Home Living                      Prior Function            PT Goals (current goals can now be found in the care plan section) Acute Rehab PT Goals Patient Stated Goal: To go home PT Goal Formulation: Patient unable to participate in goal setting Time For Goal Achievement: 04/07/20 Potential to Achieve Goals: Good Progress towards PT goals: Progressing toward goals    Frequency    Min 2X/week      PT Plan Current plan remains appropriate    Co-evaluation              AM-PAC PT "6 Clicks" Mobility   Outcome Measure  Help needed turning from your back to your side while in a flat bed without using bedrails?: A Little Help needed moving from lying on your back to sitting on the side of a flat bed without using bedrails?: A Lot Help needed moving to and from a bed to a chair (including a wheelchair)?: A Lot Help needed standing up from a chair using your arms (e.g., wheelchair or bedside chair)?: A Lot Help needed to walk in hospital room?: A Lot Help needed climbing  3-5 steps with a railing? : A Lot 6 Click Score: 13    End of Session Equipment Utilized During Treatment: Gait belt Activity Tolerance: Patient tolerated treatment well;Patient limited by lethargy;Patient limited by fatigue Patient left: in chair;with call bell/phone within reach;with chair alarm set;with family/visitor present Nurse Communication: Mobility status PT Visit Diagnosis: Muscle weakness (generalized) (M62.81);History of falling (Z91.81);Difficulty in walking, not elsewhere classified (R26.2)     Time: 7035-0093 PT Time Calculation (min) (ACUTE ONLY): 56 min  Charges:  $Therapeutic Exercise: 23-37 mins $Therapeutic Activity: 23-37 mins  Noemi Chapel, SPT Bernita Raisin 03/26/2020, 12:32 PM

## 2020-03-26 NOTE — Progress Notes (Signed)
BP continues to be low, 98/58, HR 76. Dr Mal Misty informed and is ordering another bolus of NS

## 2020-03-26 NOTE — Progress Notes (Signed)
[facility] Cardiology    SUBJECTIVE: [name] is an 86 year old female with a past medical history significant for paroxsymal atrial fibrillation s/p cardioversion x 1, previously anticoagulated on Eliquis, history of a CVA, and hyperlipidemia who presented to the ED on 03/24/20 from EMS due to a few day history of altered mental status and a fall.  She was noted to be in atrial fibrillation at a rate of 58bpm.  Eliquis held due to nosebleed.   03/26/20: The history was obtained via the patient's daughter, but she denies any evidence of chest pain, palpitations, shortness of breath, lower extremity swelling, orthopnea, PND, or syncopal episodes.  She continues to have periodic nosebleeds off of Eliquis.  Altered mental status has improved.   Vitals:   03/25/20 0806 03/25/20 1243 03/25/20 1934 03/26/20 0546  BP: (!) 157/88 (!) 153/81 (!) 164/75 125/64  Pulse: (!) 103 85 78 78  Resp: 20 18 20 20   Temp: 97.6 F (36.4 C) 97.7 F (36.5 C) 97.7 F (36.5 C) (!) 97.5 F (36.4 C)  TempSrc: Oral Oral Oral Oral  SpO2: 98% 99% 91%   Weight:      Height:         Intake/Output Summary (Last 24 hours) at 03/26/2020 8841 Last data filed at 03/26/2020 0551 Gross per 24 hour  Intake 873.89 ml  Output 2450 ml  Net -1576.11 ml      PHYSICAL EXAM  General: Well developed, well nourished, in no acute distress HEENT:  Normocephalic and atramatic Neck:  No JVD.  Lungs: Clear bilaterally to auscultation and percussion. Heart: Irregularly irregular, rate controlled . Normal S1 and S2 without gallops or murmurs.  Abdomen: Bowel sounds are positive, abdomen soft and non-tender  Msk:  Back normal, normal gait. Normal strength and tone for age. Extremities: No clubbing, cyanosis or edema.   Neuro: Alert and oriented X 3. Psych:  Good affect, responds appropriately   LABS: Basic Metabolic Panel: Recent Labs    03/24/20 0507 03/25/20 0537  NA 136 139  K 4.5 4.2  CL 102 103  CO2 28 28  GLUCOSE 153*  133*  BUN 25* 43*  CREATININE 1.02* 0.89  CALCIUM 8.6* 8.8*   Liver Function Tests: Recent Labs    03/24/20 0507  AST 22  ALT 19  ALKPHOS 25*  BILITOT 0.5  PROT 6.6  ALBUMIN 3.6   No results for input(s): LIPASE, AMYLASE in the last 72 hours. CBC: Recent Labs    03/24/20 0507 03/24/20 1853 03/25/20 0537 03/25/20 0537 03/25/20 1644 03/26/20 0551  WBC 12.1*  --  10.8*  --   --  13.3*  NEUTROABS 8.6*  --   --   --   --  8.8*  HGB 11.6*   < > 9.5*   < > 9.2* 8.9*  HCT 36.3  --  29.7*  --   --  26.4*  MCV 91.7  --  90.8  --   --  88.0  PLT 269  --  256  --   --  238   < > = values in this interval not displayed.   Cardiac Enzymes: Recent Labs    03/24/20 0651  CKTOTAL 133   BNP: Invalid input(s): POCBNP D-Dimer: No results for input(s): DDIMER in the last 72 hours. Hemoglobin A1C: No results for input(s): HGBA1C in the last 72 hours. Fasting Lipid Panel: No results for input(s): CHOL, HDL, LDLCALC, TRIG, CHOLHDL, LDLDIRECT in the last 72 hours. Thyroid Function Tests:  Recent Labs    03/24/20 0651  TSH 4.453   Anemia Panel: No results for input(s): VITAMINB12, FOLATE, FERRITIN, TIBC, IRON, RETICCTPCT in the last 72 hours.  No results found.   Echo in 2019: Normal LV systolic function with moderate TR, mild MR, and moderate pulmonary hypertension   TELEMETRY: Atrial fibrillation, controlled ventricular response; low 90s    ASSESSMENT AND PLAN:  Principal Problem:   Acute metabolic encephalopathy Active Problems:   Fall at home, initial encounter   Atrial fibrillation, chronic (HCC)   Hypothyroid   GERD (gastroesophageal reflux disease)   AMS (altered mental status)   Bradycardia   Severe epistaxis    1.  Atrial fibrillation   -Previously on Eliquis which was discontinued this hospital visit due to epistaxis   -Bradycardic on arrival, will continue to hold atenolol   -Will remain off of Eliquis and readdress in an outpatient setting  -Okay for  discharge from a cardiac standpoint; will arrange f/u with Dr. Ubaldo Glassing or Doristine Mango PA-C within 7-10 days of discharge   2.  AMS    -Unclear etiology, improved   3.  Hypertension   -Will remain off of atenolol due to bradycardia   4.  Hyperlipidemia  -Continue atorvastatin 10mg  daily   The history, physical exam findings, and plan of care were all discussed with Dr. Bartholome Bill, and all decision making was made in collaboration.   Avie Arenas  PA-C 03/26/2020 8:03 AM

## 2020-03-26 NOTE — TOC Transition Note (Signed)
Transition of Care [facility]) - CM/SW Discharge Note   Patient Details  Name: LULIE HURD MRN: [id number] Date of Birth: [birth year]  Transition of Care [facility]) CM/SW Contact:  Candie Chroman, LCSW Phone Number: 03/26/2020, 12:59 PM   Clinical Narrative: CSW working remote today. CSW called patient in room. Daughter Almyra Free answered and stated patient was asleep. CSW introduced role and explained that PT recommendations would be discussed. Daughter agreeable to home health PT. No agency preference. Made referral to [facility]. They will call Almyra Free to arrange initial visit. No further concerns. Patient has orders to discharge home today. CSW signing off.    Final next level of care: [place] Barriers to Discharge: No Barriers Identified   Patient Goals and CMS Choice Patient states their goals for this hospitalization and ongoing recovery are:: Patient asleep.   Choice offered to / list presented to : Adult Children  Discharge Placement                       Discharge Plan and Services     Post Acute Care Choice: Home Health                    HH Arranged: PT [facility] Agency: [place] ([place]) Date [place]: 03/26/20   Representative spoke with at [facility]: Floydene Flock  Social Determinants of Health (SDOH) Interventions     Readmission Risk Interventions No flowsheet data found.

## 2020-03-26 NOTE — Progress Notes (Signed)
Dr Mal Misty came to see the patient. Patient to receive 565ml NS bolus. Head CT ordered.

## 2020-03-26 NOTE — Progress Notes (Signed)
Patient is just now starting to wake up. She is eating supper. BP improved

## 2020-03-26 NOTE — Progress Notes (Signed)
Tolerated MRI well; settled for sleep; denies complaints or distress at this time.Barbaraann Faster, RN 10:46 PM 03/26/2020

## 2020-03-26 NOTE — Progress Notes (Signed)
Patient is very lethargic, very hard to arouse. Her manual BP was 100/64. this is very low for her. PERLA. No weakness in arms or legs. Dr Mal Misty notified

## 2020-03-27 DIAGNOSIS — G9341 Metabolic encephalopathy: Principal | ICD-10-CM

## 2020-03-27 DIAGNOSIS — I63423 Cerebral infarction due to embolism of bilateral anterior cerebral arteries: Secondary | ICD-10-CM

## 2020-03-27 DIAGNOSIS — I639 Cerebral infarction, unspecified: Secondary | ICD-10-CM | POA: Diagnosis not present

## 2020-03-27 MED ORDER — APIXABAN 5 MG PO TABS: 5.0000 mg | ORAL_TABLET | Freq: Two times a day (BID) | ORAL | Status: DC

## 2020-03-27 MED ORDER — APIXABAN 5 MG PO TABS
5.0000 mg | ORAL_TABLET | Freq: Two times a day (BID) | ORAL | 3 refills | Status: DC
Start: 2020-03-27 — End: 2023-11-11

## 2020-03-27 NOTE — Care Management Important Message (Signed)
Important Message  Patient Details  Name: Jenny [name] MRN: [id number] Date of Birth: 11/03/1927   Medicare Important Message Given:  Yes     Dannette Barbara 03/27/2020, 11:16 AM

## 2020-03-27 NOTE — Progress Notes (Signed)
[facility] Cardiology    SUBJECTIVE: Ms. Haggart is an 86 year old female with a past medical history significant for paroxsymal atrial fibrillation s/p cardioversion x 1, previously anticoagulated on Eliquis, history of a CVA, and hyperlipidemia who presented to the ED on 03/24/20 from EMS due to a few day history of altered mental status and a fall.  She was noted to be in atrial fibrillation at a rate of 58bpm.  Eliquis held due to nosebleed and anemia.   03/26/20: The history was obtained via the patient's daughter, but she denies any evidence of chest pain, palpitations, shortness of breath, lower extremity swelling, orthopnea, PND, or syncopal episodes.  She continues to have periodic nosebleeds off of Eliquis.  Altered mental status has improved.   03/27/20: Yesterday afternoon, Ms. Kubisiak became very lethargic, hypotensive.  Head CT was obtained revealing probable acute infarct; MRI confirming - 1.9cm acute ischemic nonhemorrhagic right ventral thalamocapsular infarct with two subcentimeter acute ischemic nonhemorrhagic cortical infarcts involving the posterior left frontal region.  Clinically she appears back at baseline.    Vitals:   03/26/20 1601 03/26/20 1830 03/26/20 2032 03/27/20 0532  BP: (!) 98/58 110/60 139/80 (!) 120/57  Pulse: 76 76 67 72  Resp:   18 18  Temp:   97.7 F (36.5 C) 98.4 F (36.9 C)  TempSrc:   Oral Oral  SpO2:   97% 100%  Weight:      Height:         Intake/Output Summary (Last 24 hours) at 03/27/2020 0740 Last data filed at 03/27/2020 8921 Gross per 24 hour  Intake 1017.22 ml  Output 350 ml  Net 667.22 ml      PHYSICAL EXAM  General: Well developed, well nourished, in no acute distress HEENT:  Normocephalic and atramatic Neck:  No JVD.  Lungs: Clear bilaterally to auscultation and percussion. Heart: Irregularly irregular, rate controlled . Normal S1 and S2 without gallops or murmurs.  Abdomen: Bowel sounds are positive, abdomen soft and non-tender  Msk:   Back normal. Normal strength and tone for age. Extremities: No clubbing, cyanosis or edema.   Neuro: Alert and oriented X 3. Psych:  Good affect, responds appropriately   LABS: Basic Metabolic Panel: Recent Labs    03/25/20 0537  NA 139  K 4.2  CL 103  CO2 28  GLUCOSE 133*  BUN 43*  CREATININE 0.89  CALCIUM 8.8*   Liver Function Tests: No results for input(s): AST, ALT, ALKPHOS, BILITOT, PROT, ALBUMIN in the last 72 hours. No results for input(s): LIPASE, AMYLASE in the last 72 hours. CBC: Recent Labs    03/25/20 0537 03/25/20 1644 03/26/20 0551 03/26/20 1438  WBC 10.8*  --  13.3*  --   NEUTROABS  --   --  8.8*  --   HGB 9.5*   < > 8.9* 9.2*  HCT 29.7*  --  26.4* 28.2*  MCV 90.8  --  88.0  --   PLT 256  --  238  --    < > = values in this interval not displayed.   Cardiac Enzymes: No results for input(s): CKTOTAL, CKMB, CKMBINDEX, TROPONINI in the last 72 hours. BNP: Invalid input(s): POCBNP D-Dimer: No results for input(s): DDIMER in the last 72 hours. Hemoglobin A1C: No results for input(s): HGBA1C in the last 72 hours. Fasting Lipid Panel: No results for input(s): CHOL, HDL, LDLCALC, TRIG, CHOLHDL, LDLDIRECT in the last 72 hours. Thyroid Function Tests: No results for input(s): TSH, T4TOTAL, T3FREE, THYROIDAB  in the last 72 hours.  Invalid input(s): FREET3 Anemia Panel: Recent Labs    03/26/20 0537 03/26/20 0551  VITAMINB12 325  --   FERRITIN  --  35  TIBC  --  266  IRON  --  57    CT HEAD WO CONTRAST  Result Date: 03/26/2020 CLINICAL DATA:  Epistaxis low blood pressure. EXAM: CT HEAD WITHOUT CONTRAST TECHNIQUE: Contiguous axial images were obtained from the base of the skull through the vertex without intravenous contrast. COMPARISON:  Head CT 03/24/2020 and 02/02/2019 FINDINGS: Brain: Stable age related cerebral atrophy, ventriculomegaly and periventricular white matter disease. No extra-axial fluid collections are identified. Ill-defined area of  low attenuation in the right thalamic area present on the most prior recent head CT of 2 days ago but not present on the prior study from 2020. Findings suspicious for a right thalamic infarct. MRI may be helpful for further evaluation. No hemispheric infarction or intracranial hemorrhage. No mass lesions. The brainstem and cerebellum are unremarkable. Vascular: Stable vascular calcifications. No aneurysm or hyperdense vessels. Skull: No skull fracture or bone lesions Sinuses/Orbits: The paranasal sinuses and mastoid air cells are grossly clear. There is a small amount of fluid in the sphenoid sinus. Other: No scalp lesions or scalp hematoma. IMPRESSION: 1. Ill-defined area of low attenuation in the right thalamic area suspicious for a right thalamic infarct. MRI may be helpful for further evaluation. 2. Stable age related cerebral atrophy, ventriculomegaly and periventricular white matter disease. Electronically Signed   By: Marijo Sanes M.D.   On: 03/26/2020 15:10   MR BRAIN WO CONTRAST  Result Date: 03/27/2020 CLINICAL DATA:  Initial evaluation for acute delirium, suspected stroke. EXAM: MRI HEAD WITHOUT CONTRAST TECHNIQUE: Multiplanar, multiecho pulse sequences of the brain and surrounding structures were obtained without intravenous contrast. COMPARISON:  Prior head CT from earlier the same day. FINDINGS: Brain: Generalized age-related cerebral atrophy. Patchy and confluent T2/FLAIR hyperintensity within the periventricular and deep white matter both cerebral hemispheres most consistent with chronic small vessel ischemic disease, mild for age. Patchy involvement of the pons noted. Focal restricted diffusion measuring up to 1.9 cm in diameter involving the right ventral thalamocapsular region consistent with an acute ischemic infarct. Finding corresponds with abnormal hypodensity seen on prior CT. 2 small additional cortical infarcts noted at the posterior left frontal region, largest of which measures 9 mm  (series 7, images 27, 28). No associated hemorrhage or mass effect about these infarcts. Otherwise, gray-white matter differentiation maintained. No other areas of acute or subacute ischemia. No encephalomalacia to suggest chronic cortical infarction. No evidence for acute intracranial hemorrhage. Single chronic microhemorrhage noted at the left periatrial white matter, likely small vessel related. No mass lesion, midline shift or mass effect. No hydrocephalus or extra-axial fluid collection. Pituitary gland suprasellar region within normal limits. Midline structures intact. Vascular: Major intracranial vascular flow voids are maintained. Skull and upper cervical spine: Craniocervical junction within normal limits. Bone marrow signal intensity normal. No scalp soft tissue abnormality. Sinuses/Orbits: Patient status post bilateral ocular lens replacement. Globes and orbital soft tissues demonstrate no acute finding. Paranasal sinuses are largely clear. Trace left mastoid effusion noted, of doubtful significance. Inner ear structures grossly normal. Other: None. IMPRESSION: 1. Approximate 1.9 cm acute ischemic nonhemorrhagic right ventral thalamocapsular infarct. 2. Two additional subcentimeter acute ischemic nonhemorrhagic cortical infarcts involving the posterior left frontal region. 3. Underlying age-related cerebral atrophy with mild chronic small vessel ischemic disease. Electronically Signed   By: Pincus Badder.D.  On: 03/27/2020 02:36     Echo in 2019: Normal LV systolic function with moderate TR, mild MR, and moderate pulmonary hypertension   TELEMETRY: Atrial fibrillation, controlled ventricular response   ASSESSMENT AND PLAN:  Principal Problem:   Acute metabolic encephalopathy Active Problems:   Fall at home, initial encounter   Atrial fibrillation, chronic (HCC)   Hypothyroid   GERD (gastroesophageal reflux disease)   AMS (altered mental status)   Bradycardia   Severe  epistaxis    1.  Longstanding persistent atrial fibrillation   -Eliquis held during this admission due to ongoing epistaxis with significant blood loss, anemia   -Patient suffered a CVA, likely embolic   -Rate remains well controlled; will remain off of atenolol   -Stable for discharge from a cardiac standpoint; will resume Eliquis per neurology recommendations   2.  Acute CVA   -No evidence of residual effects - appears back at baseline   -Evaluated by neurology who is okay with resuming Eliquis at this time   3.  Hypertension   -Hypotensive yesterday, currently normotensive   -Will remain off of atenolol due to recent CVA  4.  Hyperlipidemia   -Continue atorvastatin 10mg  daily    The history, physical exam findings, and plan of care were all discussed with Dr. Bartholome Bill, and all decision making was made in collaboration.   Avie Arenas  PA-C 03/27/2020 7:41 AM

## 2020-03-27 NOTE — Discharge Summary (Addendum)
Physician Discharge Summary  Jenny [name]:[id number] DOB: [birth year] DOA: 03/24/2020  PCP: Derinda Late, MD  Admit date: 03/24/2020 Discharge date: 03/27/2020  Discharge disposition: Home with home health therapy   Recommendations for Outpatient Follow-Up:   Follow-up with cardiologist, Dr. Ubaldo Glassing, in 1 week   Discharge Diagnosis:   Principal Problem:   Severe epistaxis Active Problems:   Embolic stroke [facility])   Acute metabolic encephalopathy   Fall at home, initial encounter   Atrial fibrillation, chronic ([place])   Hypothyroid   GERD (gastroesophageal reflux disease)   AMS (altered mental status)   Bradycardia    Discharge Condition: Stable.  Diet recommendation:  Diet Order            Diet - low sodium heart healthy           Diet 2 gram sodium Room service appropriate? Yes; Fluid consistency: Thin  Diet effective now                   Code Status: DNR     Hospital Course:   [name] is an 86 year old woman with medical history significant for atrial fibrillation on Eliquis, hypertension, hypothyroidism, hearing impairment, epistaxis. She was brought to the hospital on 03/24/2020 for evaluation of nosebleeding, altered mental status (confusion) and a fall at home. Reportedly, she had been prescribed Omnicef for presumed UTI a few days before admission.  She was admitted to the hospital for altered mental status and epistaxis. Urinalysis did not show any evidence of infection. Eliquis was held because of recurrent epistaxis in the hospital. Atenolol was also held because of bradycardia. She was evaluated by PT who recommended home health therapy. Her condition improved and plan was to discharge on 03/26/2020. However, she developed sudden onset of lethargy and mild hypotension. She was treated with IV fluids. CT head and MRI brain were obtained and there was evidence of embolic stroke on MRI. Dr. Irish Elders, neurologist, was consulted and resuming  Eliquis for stroke prophylaxis since epistaxis has resolved.  Patient's condition is back to baseline and she is deemed stable for discharge to home today. Discharge plan was discussed with the patient and her daughter, Jenny [name], at the bedside and they verbalized understanding. They understand that she is at increased risk of having recurrent epistaxis on Eliquis or any other type of bleeding.    Medical Consultants:    Cardiologist  Neurologist   Discharge Exam:    Vitals:   03/26/20 1601 03/26/20 1830 03/26/20 2032 03/27/20 0532  BP: (!) 98/58 110/60 139/80 (!) 120/57  Pulse: 76 76 67 72  Resp:   18 18  Temp:   97.7 F (36.5 C) 98.4 F (36.9 C)  TempSrc:   Oral Oral  SpO2:   97% 100%  Weight:      Height:         GEN: NAD SKIN: No rash EYES: EOMI ENT: MMM, hard of hearing  CV: RRR PULM: CTA B ABD: soft, ND, NT, +BS CNS: AAO x 3, non focal EXT: No edema or tenderness   The results of significant diagnostics from this hospitalization (including imaging, microbiology, ancillary and laboratory) are listed below for reference.     Procedures and Diagnostic Studies:   CT Head Wo Contrast  Result Date: 03/24/2020 CLINICAL DATA:  Facial trauma.  Fall.  Progressive confusion. EXAM: CT HEAD WITHOUT CONTRAST CT MAXILLOFACIAL WITHOUT CONTRAST CT CERVICAL SPINE WITHOUT CONTRAST TECHNIQUE: Multidetector CT imaging of the head, cervical spine, and  maxillofacial structures were performed using the standard protocol without intravenous contrast. Multiplanar CT image reconstructions of the cervical spine and maxillofacial structures were also generated. COMPARISON:  CT head 02/02/2019 FINDINGS: CT HEAD FINDINGS Brain: No evidence of acute infarction, hemorrhage, hydrocephalus, extra-axial collection or mass lesion/mass effect. Mild prominence of the sulci and ventricles. There is mild diffuse low-attenuation within the subcortical and periventricular white matter compatible with  chronic microvascular disease. Vascular: No hyperdense vessel or unexpected calcification. Skull: Normal. Negative for fracture or focal lesion. Other: None. CT MAXILLOFACIAL FINDINGS Osseous: No fracture or mandibular dislocation. No destructive process. Orbits: Negative. No traumatic or inflammatory finding. Sinuses: Clear. Soft tissues: Negative. CT CERVICAL SPINE FINDINGS Alignment: Straightening of normal cervical lordosis. Skull base and vertebrae: No acute fracture. No primary bone lesion or focal pathologic process. Soft tissues and spinal canal: No prevertebral fluid or swelling. No visible canal hematoma. Disc levels: Multilevel disc space narrowing and endplate spurring noted compatible with degenerative disc disease. Most advanced C5-6. Upper chest: Aortic atherosclerotic calcifications noted Other: None IMPRESSION: 1. No acute intracranial abnormality. 2. Chronic small vessel ischemic disease and brain atrophy. 3. No evidence for facial bone fracture. 4. No evidence for cervical spine fracture or dislocation. 5. Cervical degenerative disc disease. 6. Aortic atherosclerosis. Aortic Atherosclerosis (ICD10-I70.0). Electronically Signed   By: Kerby Moors M.D.   On: 03/24/2020 06:07   CT Cervical Spine Wo Contrast  Result Date: 03/24/2020 CLINICAL DATA:  Facial trauma.  Fall.  Progressive confusion. EXAM: CT HEAD WITHOUT CONTRAST CT MAXILLOFACIAL WITHOUT CONTRAST CT CERVICAL SPINE WITHOUT CONTRAST TECHNIQUE: Multidetector CT imaging of the head, cervical spine, and maxillofacial structures were performed using the standard protocol without intravenous contrast. Multiplanar CT image reconstructions of the cervical spine and maxillofacial structures were also generated. COMPARISON:  CT head 02/02/2019 FINDINGS: CT HEAD FINDINGS Brain: No evidence of acute infarction, hemorrhage, hydrocephalus, extra-axial collection or mass lesion/mass effect. Mild prominence of the sulci and ventricles. There is mild  diffuse low-attenuation within the subcortical and periventricular white matter compatible with chronic microvascular disease. Vascular: No hyperdense vessel or unexpected calcification. Skull: Normal. Negative for fracture or focal lesion. Other: None. CT MAXILLOFACIAL FINDINGS Osseous: No fracture or mandibular dislocation. No destructive process. Orbits: Negative. No traumatic or inflammatory finding. Sinuses: Clear. Soft tissues: Negative. CT CERVICAL SPINE FINDINGS Alignment: Straightening of normal cervical lordosis. Skull base and vertebrae: No acute fracture. No primary bone lesion or focal pathologic process. Soft tissues and spinal canal: No prevertebral fluid or swelling. No visible canal hematoma. Disc levels: Multilevel disc space narrowing and endplate spurring noted compatible with degenerative disc disease. Most advanced C5-6. Upper chest: Aortic atherosclerotic calcifications noted Other: None IMPRESSION: 1. No acute intracranial abnormality. 2. Chronic small vessel ischemic disease and brain atrophy. 3. No evidence for facial bone fracture. 4. No evidence for cervical spine fracture or dislocation. 5. Cervical degenerative disc disease. 6. Aortic atherosclerosis. Aortic Atherosclerosis (ICD10-I70.0). Electronically Signed   By: Kerby Moors M.D.   On: 03/24/2020 06:07   DG Chest Port 1 View  Result Date: 03/24/2020 CLINICAL DATA:  Weakness EXAM: PORTABLE CHEST 1 VIEW COMPARISON:  07/31/2015 FINDINGS: The heart size and mediastinal contours are within normal limits. Both lungs are clear. The visualized skeletal structures are unremarkable. IMPRESSION: No active disease. Electronically Signed   By: Ulyses Jarred M.D.   On: 03/24/2020 05:26   CT Maxillofacial WO CM  Result Date: 03/24/2020 CLINICAL DATA:  Facial trauma.  Fall.  Progressive confusion. EXAM: CT HEAD  WITHOUT CONTRAST CT MAXILLOFACIAL WITHOUT CONTRAST CT CERVICAL SPINE WITHOUT CONTRAST TECHNIQUE: Multidetector CT imaging of the  head, cervical spine, and maxillofacial structures were performed using the standard protocol without intravenous contrast. Multiplanar CT image reconstructions of the cervical spine and maxillofacial structures were also generated. COMPARISON:  CT head 02/02/2019 FINDINGS: CT HEAD FINDINGS Brain: No evidence of acute infarction, hemorrhage, hydrocephalus, extra-axial collection or mass lesion/mass effect. Mild prominence of the sulci and ventricles. There is mild diffuse low-attenuation within the subcortical and periventricular white matter compatible with chronic microvascular disease. Vascular: No hyperdense vessel or unexpected calcification. Skull: Normal. Negative for fracture or focal lesion. Other: None. CT MAXILLOFACIAL FINDINGS Osseous: No fracture or mandibular dislocation. No destructive process. Orbits: Negative. No traumatic or inflammatory finding. Sinuses: Clear. Soft tissues: Negative. CT CERVICAL SPINE FINDINGS Alignment: Straightening of normal cervical lordosis. Skull base and vertebrae: No acute fracture. No primary bone lesion or focal pathologic process. Soft tissues and spinal canal: No prevertebral fluid or swelling. No visible canal hematoma. Disc levels: Multilevel disc space narrowing and endplate spurring noted compatible with degenerative disc disease. Most advanced C5-6. Upper chest: Aortic atherosclerotic calcifications noted Other: None IMPRESSION: 1. No acute intracranial abnormality. 2. Chronic small vessel ischemic disease and brain atrophy. 3. No evidence for facial bone fracture. 4. No evidence for cervical spine fracture or dislocation. 5. Cervical degenerative disc disease. 6. Aortic atherosclerosis. Aortic Atherosclerosis (ICD10-I70.0). Electronically Signed   By: Kerby Moors M.D.   On: 03/24/2020 06:07     Labs:   Basic Metabolic Panel: Recent Labs  Lab 03/24/20 0507 03/25/20 0537  NA 136 139  K 4.5 4.2  CL 102 103  CO2 28 28  GLUCOSE 153* 133*  BUN 25*  43*  CREATININE 1.02* 0.89  CALCIUM 8.6* 8.8*   GFR Estimated Creatinine Clearance: 47.1 mL/min (by C-G formula based on SCr of 0.89 mg/dL). Liver Function Tests: Recent Labs  Lab 03/24/20 0507  AST 22  ALT 19  ALKPHOS 25*  BILITOT 0.5  PROT 6.6  ALBUMIN 3.6   No results for input(s): LIPASE, AMYLASE in the last 168 hours. No results for input(s): AMMONIA in the last 168 hours. Coagulation profile No results for input(s): INR, PROTIME in the last 168 hours.  CBC: Recent Labs  Lab 03/24/20 0507 03/24/20 0507 03/24/20 1853 03/25/20 0537 03/25/20 1644 03/26/20 0551 03/26/20 1438  WBC 12.1*  --   --  10.8*  --  13.3*  --   NEUTROABS 8.6*  --   --   --   --  8.8*  --   HGB 11.6*   < > 10.5* 9.5* 9.2* 8.9* 9.2*  HCT 36.3  --   --  29.7*  --  26.4* 28.2*  MCV 91.7  --   --  90.8  --  88.0  --   PLT 269  --   --  256  --  238  --    < > = values in this interval not displayed.   Cardiac Enzymes: Recent Labs  Lab 03/24/20 0651  CKTOTAL 133   BNP: Invalid input(s): POCBNP CBG: Recent Labs  Lab 03/24/20 0506  GLUCAP 143*   D-Dimer No results for input(s): DDIMER in the last 72 hours. Hgb A1c No results for input(s): HGBA1C in the last 72 hours. Lipid Profile No results for input(s): CHOL, HDL, LDLCALC, TRIG, CHOLHDL, LDLDIRECT in the last 72 hours. Thyroid function studies No results for input(s): TSH, T4TOTAL, T3FREE, THYROIDAB in the last  72 hours.  Invalid input(s): FREET3 Anemia work up [employer]    03/26/20 0537 03/26/20 0551  VITAMINB12 325  --   FERRITIN  --  35  TIBC  --  266  IRON  --  57   Microbiology Recent Results (from the past 240 hour(s))  Urine culture     Status: None   Collection Time: 03/24/20  5:07 AM   Specimen: Urine, Random  Result Value Ref Range Status   Specimen Description   Final    URINE, RANDOM Performed at [facility], [address] [place], [place] [address]    Special Requests   Final     NONE Performed at [facility], [address] [place], [place] [address]    Culture   Final    NO GROWTH Performed at [facility], [place] [address], [place], [place] [address]    Report Status 03/25/2020 FINAL  Final  Culture, blood (routine x 2)     Status: None (Preliminary result)   Collection Time: 03/24/20  5:07 AM   Specimen: BLOOD  Result Value Ref Range Status   Specimen Description BLOOD BLOOD LEFT FOREARM  Final   Special Requests   Final    BOTTLES DRAWN AEROBIC AND ANAEROBIC Blood Culture results may not be optimal due to an inadequate volume of blood received in culture bottles   Culture   Final    NO GROWTH 3 DAYS Performed at [facility], [address] [place], [place] [address]    Report Status PENDING  Incomplete  SARS Coronavirus 2 by RT PCR (hospital order, performed in [place] hospital lab) Nasopharyngeal Nasopharyngeal Swab     Status: None   Collection Time: 03/24/20  5:07 AM   Specimen: Nasopharyngeal Swab  Result Value Ref Range Status   SARS Coronavirus 2 NEGATIVE NEGATIVE Final    Comment: (NOTE) SARS-CoV-2 target nucleic acids are NOT DETECTED.  The SARS-CoV-2 RNA is generally detectable in upper and lower respiratory specimens during the acute phase of infection. The lowest concentration of SARS-CoV-2 viral copies this assay can detect is 250 copies / mL. A negative result does not preclude SARS-CoV-2 infection and should not be used as the sole basis for treatment or other patient management decisions.  A negative result may occur with improper specimen collection / handling, submission of specimen other than nasopharyngeal swab, presence of viral mutation(s) within the areas targeted by this assay, and inadequate number of viral copies (<250 copies / mL). A negative result must be combined with clinical observations, patient history, and epidemiological information.  Fact Sheet for Patients:    StrictlyIdeas.no  Fact Sheet for Healthcare Providers: BankingDealers.co.za  This test is not yet approved or  cleared by the [place] FDA and has been authorized for detection and/or diagnosis of SARS-CoV-2 by FDA under an Emergency Use Authorization (EUA).  This EUA will remain in effect (meaning this test can be used) for the duration of the COVID-19 declaration under Section 564(b)(1) of the Act, 21 U.S.C. section 360bbb-3(b)(1), unless the authorization is terminated or revoked sooner.  Performed at [facility], [address] [place], [place] [address]   Culture, blood (routine x 2)     Status: None (Preliminary result)   Collection Time: 03/24/20  5:08 AM   Specimen: BLOOD  Result Value Ref Range Status   Specimen Description BLOOD BLOOD LEFT HAND  Final   Special Requests   Final  BOTTLES DRAWN AEROBIC AND ANAEROBIC Blood Culture results may not be optimal due to an inadequate volume of blood received in culture bottles   Culture   Final    NO GROWTH 3 DAYS Performed at [facility], [address] [place], [place] [address]    Report Status PENDING  Incomplete     Discharge Instructions:   Discharge Instructions    Diet - low sodium heart healthy   Complete by: As directed    Face-to-face encounter (required for Medicare/Medicaid patients)   Complete by: As directed    I Lasean Gorniak certify that this patient is under my care and that I, or a nurse practitioner or physician's assistant working with me, had a face-to-face encounter that meets the physician face-to-face encounter requirements with this patient on 03/26/2020. The encounter with the patient was in whole, or in part for the following medical condition(s) which is the primary reason for home health care (List medical condition): Falls, debility   The encounter with the patient was in whole, or in part, for the following medical  condition, which is the primary reason for home health care: Falls, debility, stroke   I certify that, based on my findings, the following services are medically necessary home health services: Physical therapy   Reason for Medically Necessary Home Health Services: Therapy- Personnel officer, Public librarian   My clinical findings support the need for the above services: Unable to leave home safely without assistance and/or assistive device   Further, I certify that my clinical findings support that this patient is homebound due to: Unable to leave home safely without assistance   Home Health   Complete by: As directed    To provide the following care/treatments: PT   Increase activity slowly   Complete by: As directed      Allergies as of 03/27/2020      Reactions   Flagyl [metronidazole] Nausea And Vomiting      Medication List    STOP taking these medications   atenolol 25 MG tablet Commonly known as: TENORMIN   cefdinir 300 MG capsule Commonly known as: OMNICEF     TAKE these medications   acetaminophen 500 MG tablet Commonly known as: TYLENOL Take 500 mg by mouth every 6 (six) hours as needed for mild pain, fever or headache.   atorvastatin 10 MG tablet Commonly known as: LIPITOR Take 10 mg by mouth daily.   cholecalciferol 25 MCG (1000 UNIT) tablet Commonly known as: VITAMIN D3 Take 1,000 Units by mouth daily.   DULoxetine 60 MG capsule Commonly known as: CYMBALTA Take 120 mg by mouth daily.   Eliquis 5 MG Tabs tablet Generic drug: apixaban Take 5 mg by mouth 2 (two) times daily.   levothyroxine 100 MCG tablet Commonly known as: SYNTHROID Take 100 mcg by mouth daily before breakfast.   MULTIVITAMIN PO Take 1 tablet by mouth daily.   pantoprazole 40 MG tablet Commonly known as: PROTONIX Take 40 mg by mouth 2 (two) times daily.       Follow-up Information    Teodoro Spray, MD. Go on 04/03/2020.   Specialty: Cardiology Why: at  11:30am for hospital follow-up Contact information: [place] [address] [place] Follow up.   Specialty: Home Health Services Why: They will follow up with you for your home health physical therapy needs.  Time coordinating discharge: 32 minutes  Signed:  Birda Didonato  [facility] 03/27/2020, 11:38 AM   Pager on [email or web address]. If 7PM-7AM, please contact night-coverage at [email or web address]

## 2020-03-27 NOTE — Consult Note (Signed)
Reason for Consult: embolic strokes  Requesting Physician: Dr. Mal Misty  CC: embolic strokes    HPI: Jenny [name] is an 86 y.o. female with history ofparoxsymal atrial fibrillations/p cardioversion, anticoagulated with Eliquis that was stopped due to nose bleed, history of CVA, and hyperlipidemiawho presented to emergency room with parent altered mental status and a fall.  Patient had blood from her nose. Eliquis as held, pt was found to have small R thalamic and 2 L frontal strokes.  Now much improved and close to baseline.    Past Medical History:  Diagnosis Date  . Depression   . Dyspnea   . Dysrhythmia   . GERD (gastroesophageal reflux disease)   . HBP (high blood pressure)   . Reflux   . Skin cancer   . Thyroid disease     Past Surgical History:  Procedure Laterality Date  . BREAST BIOPSY Right   . CARDIOVERSION N/A 04/07/2018   Procedure: CARDIOVERSION;  Surgeon: Teodoro Spray, MD;  Location: [facility] ORS;  Service: Cardiovascular;  Laterality: N/A;  . ESOPHAGOGASTRODUODENOSCOPY (EGD) WITH PROPOFOL N/A 04/26/2015   Procedure: ESOPHAGOGASTRODUODENOSCOPY (EGD) WITH PROPOFOL;  Surgeon: Manya Silvas, MD;  Location: [facility] ENDOSCOPY;  Service: Endoscopy;  Laterality: N/A;  . KNEE ARTHROSCOPY WITH LATERAL MENISECTOMY Left 03/22/2019   Procedure: KNEE ARTHROSCOPY WITH PARTIAL, MEDIAL AND LATERAL MENISECTOMY;  Surgeon: Hessie Knows, MD;  Location: [facility] ORS;  Service: Orthopedics;  Laterality: Left;  . NECK SURGERY      No family history on file.  Social History:  reports that she has never smoked. She has never used smokeless tobacco. She reports that she does not drink alcohol and does not use drugs.  Allergies  Allergen Reactions  . Flagyl [Metronidazole] Nausea And Vomiting    Medications: I have reviewed the patient's current medications.    Physical Examination: Blood pressure (!) 120/57, pulse 72, temperature 98.4 F (36.9 C), temperature source Oral, resp.  rate 18, height 5\' 7"  (1.702 m), weight 81.6 kg, SpO2 100 %.  History obtained from the patient  General ROS: negative for - chills, fatigue, fever, night sweats, weight gain or weight loss Psychological ROS: negative for - behavioral disorder, hallucinations, memory difficulties, mood swings or suicidal ideation Ophthalmic ROS: negative for - blurry vision, double vision, eye pain or loss of vision ENT ROS: negative for - epistaxis, nasal discharge, oral lesions, sore throat, tinnitus or vertigo Allergy and Immunology ROS: negative for - hives or itchy/watery eyes Hematological and Lymphatic ROS: negative for - bleeding problems, bruising or swollen lymph nodes Endocrine ROS: negative for - galactorrhea, hair pattern changes, polydipsia/polyuria or temperature intolerance Respiratory ROS: negative for - cough, hemoptysis, shortness of breath or wheezing Cardiovascular ROS: negative for - chest pain, dyspnea on exertion, edema or irregular heartbeat Gastrointestinal ROS: negative for - abdominal pain, diarrhea, hematemesis, nausea/vomiting or stool incontinence Genito-Urinary ROS: negative for - dysuria, hematuria, incontinence or urinary frequency/urgency Musculoskeletal ROS: negative for - joint swelling or muscular weakness Neurological ROS: as noted in HPI Dermatological ROS: negative for rash and skin lesion changes  Laboratory Studies:   Basic Metabolic Panel: Recent Labs  Lab 03/24/20 0507 03/25/20 0537  NA 136 139  K 4.5 4.2  CL 102 103  CO2 28 28  GLUCOSE 153* 133*  BUN 25* 43*  CREATININE 1.02* 0.89  CALCIUM 8.6* 8.8*    Liver Function Tests: Recent Labs  Lab 03/24/20 0507  AST 22  ALT 19  ALKPHOS 25*  BILITOT 0.5  PROT  6.6  ALBUMIN 3.6   No results for input(s): LIPASE, AMYLASE in the last 168 hours. No results for input(s): AMMONIA in the last 168 hours.  CBC: Recent Labs  Lab 03/24/20 0507 03/24/20 0507 03/24/20 1853 03/25/20 0537 03/25/20 1644  03/26/20 0551 03/26/20 1438  WBC 12.1*  --   --  10.8*  --  13.3*  --   NEUTROABS 8.6*  --   --   --   --  8.8*  --   HGB 11.6*   < > 10.5* 9.5* 9.2* 8.9* 9.2*  HCT 36.3  --   --  29.7*  --  26.4* 28.2*  MCV 91.7  --   --  90.8  --  88.0  --   PLT 269  --   --  256  --  238  --    < > = values in this interval not displayed.    Cardiac Enzymes: Recent Labs  Lab 03/24/20 0651  CKTOTAL 133    BNP: Invalid input(s): POCBNP  CBG: Recent Labs  Lab 03/24/20 0506  GLUCAP 143*    Microbiology: Results for orders placed or performed during the hospital encounter of 03/24/20  Urine culture     Status: None   Collection Time: 03/24/20  5:07 AM   Specimen: Urine, Random  Result Value Ref Range Status   Specimen Description   Final    URINE, RANDOM Performed at [facility], [address], [place], [place] [address]    Special Requests   Final    NONE Performed at [facility], [address] [place], [place] [address]    Culture   Final    NO GROWTH Performed at [facility], [place] [address] [place], [place] [address]    Report Status 03/25/2020 FINAL  Final  Culture, blood (routine x 2)     Status: None (Preliminary result)   Collection Time: 03/24/20  5:07 AM   Specimen: BLOOD  Result Value Ref Range Status   Specimen Description BLOOD BLOOD LEFT FOREARM  Final   Special Requests   Final    BOTTLES DRAWN AEROBIC AND ANAEROBIC Blood Culture results may not be optimal due to an inadequate volume of blood received in culture bottles   Culture   Final    NO GROWTH 3 DAYS Performed at [facility], [address], [place], [place] [address]    Report Status PENDING  Incomplete  SARS Coronavirus 2 by RT PCR (hospital order, performed in [place] hospital lab) Nasopharyngeal Nasopharyngeal Swab     Status: None   Collection Time: 03/24/20  5:07 AM   Specimen: Nasopharyngeal Swab  Result Value Ref Range Status   SARS Coronavirus 2  NEGATIVE NEGATIVE Final    Comment: (NOTE) SARS-CoV-2 target nucleic acids are NOT DETECTED.  The SARS-CoV-2 RNA is generally detectable in upper and lower respiratory specimens during the acute phase of infection. The lowest concentration of SARS-CoV-2 viral copies this assay can detect is 250 copies / mL. A negative result does not preclude SARS-CoV-2 infection and should not be used as the sole basis for treatment or other patient management decisions.  A negative result may occur with improper specimen collection / handling, submission of specimen other than nasopharyngeal swab, presence of viral mutation(s) within the areas targeted by this assay, and inadequate number of viral copies (<250 copies / mL). A negative result must be combined with clinical observations, patient history, and epidemiological information.  Fact Sheet for Patients:   StrictlyIdeas.no  Fact Sheet for Healthcare Providers: BankingDealers.co.za  This test is not yet approved or  cleared by the [place] FDA and has been authorized for detection and/or diagnosis of SARS-CoV-2 by FDA under an Emergency Use Authorization (EUA).  This EUA will remain in effect (meaning this test can be used) for the duration of the COVID-19 declaration under Section 564(b)(1) of the Act, 21 U.S.C. section 360bbb-3(b)(1), unless the authorization is terminated or revoked sooner.  Performed at [facility], [address] [place], [place] [address]   Culture, blood (routine x 2)     Status: None (Preliminary result)   Collection Time: 03/24/20  5:08 AM   Specimen: BLOOD  Result Value Ref Range Status   Specimen Description BLOOD BLOOD LEFT HAND  Final   Special Requests   Final    BOTTLES DRAWN AEROBIC AND ANAEROBIC Blood Culture results may not be optimal due to an inadequate volume of blood received in culture bottles   Culture   Final    NO GROWTH 3  DAYS Performed at [facility], [address] [place], [place] [address]    Report Status PENDING  Incomplete    Coagulation Studies: No results for input(s): LABPROT, INR in the last 72 hours.  Urinalysis:  Recent Labs  Lab 03/24/20 0507  COLORURINE YELLOW*  LABSPEC 1.020  PHURINE 5.0  GLUCOSEU NEGATIVE  HGBUR LARGE*  BILIRUBINUR NEGATIVE  KETONESUR NEGATIVE  PROTEINUR 30*  NITRITE NEGATIVE  LEUKOCYTESUR NEGATIVE    Lipid Panel:  No results found for: CHOL, TRIG, HDL, CHOLHDL, VLDL, LDLCALC  HgbA1C: No results found for: HGBA1C  Urine Drug Screen:  No results found for: LABOPIA, COCAINSCRNUR, LABBENZ, AMPHETMU, THCU, LABBARB  Alcohol Level: No results for input(s): ETH in the last 168 hours.  Imaging: CT HEAD WO CONTRAST  Result Date: 03/26/2020 CLINICAL DATA:  Epistaxis low blood pressure. EXAM: CT HEAD WITHOUT CONTRAST TECHNIQUE: Contiguous axial images were obtained from the base of the skull through the vertex without intravenous contrast. COMPARISON:  Head CT 03/24/2020 and 02/02/2019 FINDINGS: Brain: Stable age related cerebral atrophy, ventriculomegaly and periventricular white matter disease. No extra-axial fluid collections are identified. Ill-defined area of low attenuation in the right thalamic area present on the most prior recent head CT of 2 days ago but not present on the prior study from 2020. Findings suspicious for a right thalamic infarct. MRI may be helpful for further evaluation. No hemispheric infarction or intracranial hemorrhage. No mass lesions. The brainstem and cerebellum are unremarkable. Vascular: Stable vascular calcifications. No aneurysm or hyperdense vessels. Skull: No skull fracture or bone lesions Sinuses/Orbits: The paranasal sinuses and mastoid air cells are grossly clear. There is a small amount of fluid in the sphenoid sinus. Other: No scalp lesions or scalp hematoma. IMPRESSION: 1. Ill-defined area of low attenuation in the  right thalamic area suspicious for a right thalamic infarct. MRI may be helpful for further evaluation. 2. Stable age related cerebral atrophy, ventriculomegaly and periventricular white matter disease. Electronically Signed   By: Marijo Sanes M.D.   On: 03/26/2020 15:10   MR BRAIN WO CONTRAST  Result Date: 03/27/2020 CLINICAL DATA:  Initial evaluation for acute delirium, suspected stroke. EXAM: MRI HEAD WITHOUT CONTRAST TECHNIQUE: Multiplanar, multiecho pulse sequences of the brain and surrounding structures were obtained without intravenous contrast. COMPARISON:  Prior head CT from earlier the same day. FINDINGS: Brain: Generalized age-related cerebral atrophy. Patchy and confluent T2/FLAIR hyperintensity within  the periventricular and deep white matter both cerebral hemispheres most consistent with chronic small vessel ischemic disease, mild for age. Patchy involvement of the pons noted. Focal restricted diffusion measuring up to 1.9 cm in diameter involving the right ventral thalamocapsular region consistent with an acute ischemic infarct. Finding corresponds with abnormal hypodensity seen on prior CT. 2 small additional cortical infarcts noted at the posterior left frontal region, largest of which measures 9 mm (series 7, images 27, 28). No associated hemorrhage or mass effect about these infarcts. Otherwise, gray-white matter differentiation maintained. No other areas of acute or subacute ischemia. No encephalomalacia to suggest chronic cortical infarction. No evidence for acute intracranial hemorrhage. Single chronic microhemorrhage noted at the left periatrial white matter, likely small vessel related. No mass lesion, midline shift or mass effect. No hydrocephalus or extra-axial fluid collection. Pituitary gland suprasellar region within normal limits. Midline structures intact. Vascular: Major intracranial vascular flow voids are maintained. Skull and upper cervical spine: Craniocervical junction  within normal limits. Bone marrow signal intensity normal. No scalp soft tissue abnormality. Sinuses/Orbits: Patient status post bilateral ocular lens replacement. Globes and orbital soft tissues demonstrate no acute finding. Paranasal sinuses are largely clear. Trace left mastoid effusion noted, of doubtful significance. Inner ear structures grossly normal. Other: None. IMPRESSION: 1. Approximate 1.9 cm acute ischemic nonhemorrhagic right ventral thalamocapsular infarct. 2. Two additional subcentimeter acute ischemic nonhemorrhagic cortical infarcts involving the posterior left frontal region. 3. Underlying age-related cerebral atrophy with mild chronic small vessel ischemic disease. Electronically Signed   By: Jeannine Boga M.D.   On: 03/27/2020 02:36     Assessment/Plan:  86 y.o. female with history ofparoxsymal atrial fibrillations/p cardioversion, anticoagulated with Eliquis that was stopped due to nose bleed, history of CVA, and hyperlipidemiawho presented to emergency room with parent altered mental status and a fall.  Patient had blood from her nose. Eliquis as held, pt was found to have small R thalamic and 2 L frontal strokes.  Now much improved and close to baseline.  - D/w daughter at bedside - Patient is hard of hearing but close to her baseline - strokes are small, ebmolic appearing. I am in agreement that we can restart eliquis today.  - No further nose bleeding - Either d/c later today or possibly tomorrow in AM  - very minimal chance of hemorrhagic conversion from the stroke perspective given the seize of them with starting eliquis - call if any further questions.  03/27/2020, 11:12 AM

## 2020-03-29 LAB — CULTURE, BLOOD (ROUTINE X 2)
Culture: NO GROWTH
Culture: NO GROWTH

## 2020-04-09 ENCOUNTER — Other Ambulatory Visit: Payer: Self-pay

## 2020-04-09 ENCOUNTER — Encounter: Payer: Self-pay | Admitting: Oncology

## 2020-04-09 ENCOUNTER — Inpatient Hospital Stay: Payer: Medicare Other

## 2020-04-09 ENCOUNTER — Telehealth: Payer: Self-pay

## 2020-04-09 ENCOUNTER — Inpatient Hospital Stay: Payer: Medicare Other | Attending: Oncology | Admitting: Oncology

## 2020-04-09 VITALS — BP 145/81 | HR 75 | Temp 96.9°F | Resp 16

## 2020-04-09 DIAGNOSIS — Z85828 Personal history of other malignant neoplasm of skin: Secondary | ICD-10-CM | POA: Diagnosis not present

## 2020-04-09 DIAGNOSIS — D509 Iron deficiency anemia, unspecified: Secondary | ICD-10-CM | POA: Insufficient documentation

## 2020-04-09 DIAGNOSIS — D649 Anemia, unspecified: Secondary | ICD-10-CM

## 2020-04-09 DIAGNOSIS — Z7901 Long term (current) use of anticoagulants: Secondary | ICD-10-CM | POA: Diagnosis not present

## 2020-04-09 DIAGNOSIS — H9190 Unspecified hearing loss, unspecified ear: Secondary | ICD-10-CM | POA: Diagnosis not present

## 2020-04-09 DIAGNOSIS — D500 Iron deficiency anemia secondary to blood loss (chronic): Secondary | ICD-10-CM | POA: Diagnosis not present

## 2020-04-09 LAB — CBC WITH DIFFERENTIAL/PLATELET
Abs Immature Granulocytes: 0.05 10*3/uL (ref 0.00–0.07)
Basophils Absolute: 0 10*3/uL (ref 0.0–0.1)
Basophils Relative: 1 %
Eosinophils Absolute: 0.3 10*3/uL (ref 0.0–0.5)
Eosinophils Relative: 3 %
HCT: 25.9 % — ABNORMAL LOW (ref 36.0–46.0)
Hemoglobin: 8.1 g/dL — ABNORMAL LOW (ref 12.0–15.0)
Immature Granulocytes: 1 %
Lymphocytes Relative: 12 %
Lymphs Abs: 1 10*3/uL (ref 0.7–4.0)
MCH: 27.8 pg (ref 26.0–34.0)
MCHC: 31.3 g/dL (ref 30.0–36.0)
MCV: 89 fL (ref 80.0–100.0)
Monocytes Absolute: 0.7 10*3/uL (ref 0.1–1.0)
Monocytes Relative: 8 %
Neutro Abs: 6.1 10*3/uL (ref 1.7–7.7)
Neutrophils Relative %: 75 %
Platelets: 480 10*3/uL — ABNORMAL HIGH (ref 150–400)
RBC: 2.91 MIL/uL — ABNORMAL LOW (ref 3.87–5.11)
RDW: 15.6 % — ABNORMAL HIGH (ref 11.5–15.5)
WBC: 8.1 10*3/uL (ref 4.0–10.5)
nRBC: 0 % (ref 0.0–0.2)

## 2020-04-09 LAB — RETIC PANEL
Immature Retic Fract: 30.1 % — ABNORMAL HIGH (ref 2.3–15.9)
RBC.: 2.9 MIL/uL — ABNORMAL LOW (ref 3.87–5.11)
Retic Count, Absolute: 147.5 10*3/uL (ref 19.0–186.0)
Retic Ct Pct: 5.1 % — ABNORMAL HIGH (ref 0.4–3.1)
Reticulocyte Hemoglobin: 21 pg — ABNORMAL LOW (ref >27.9)

## 2020-04-09 LAB — COMPREHENSIVE METABOLIC PANEL
ALT: 16 U/L (ref 0–44)
AST: 23 U/L (ref 15–41)
Albumin: 3.7 g/dL (ref 3.5–5.0)
Alkaline Phosphatase: 30 U/L — ABNORMAL LOW (ref 38–126)
Anion gap: 8 (ref 5–15)
BUN: 20 mg/dL (ref 8–23)
CO2: 27 mmol/L (ref 22–32)
Calcium: 8.9 mg/dL (ref 8.9–10.3)
Chloride: 102 mmol/L (ref 98–111)
Creatinine, Ser: 1 mg/dL (ref 0.44–1.00)
GFR calc Af Amer: 58 mL/min — ABNORMAL LOW (ref >60)
GFR calc non Af Amer: 50 mL/min — ABNORMAL LOW (ref >60)
Glucose, Bld: 158 mg/dL — ABNORMAL HIGH (ref 70–99)
Potassium: 4.7 mmol/L (ref 3.5–5.1)
Sodium: 137 mmol/L (ref 135–145)
Total Bilirubin: 0.6 mg/dL (ref 0.3–1.2)
Total Protein: 7.2 g/dL (ref 6.5–8.1)

## 2020-04-09 LAB — LACTATE DEHYDROGENASE: LDH: 92 U/L — ABNORMAL LOW (ref 98–192)

## 2020-04-09 LAB — FOLATE: Folate: 44 ng/mL (ref >5.9)

## 2020-04-09 LAB — PATHOLOGIST SMEAR REVIEW

## 2020-04-09 MED ORDER — FERROUS SULFATE 325 (65 FE) MG PO TBEC: 325.0000 mg | DELAYED_RELEASE_TABLET | Freq: Three times a day (TID) | ORAL | 1 refills | Status: DC

## 2020-04-09 NOTE — Telephone Encounter (Signed)
-----   Message from Earlie Server, MD sent at 04/09/2020 12:44 PM EDT ----- Please let pt and her daughter know that her lab work show iron deficiency and I recommend her to start oral iron supplementation. Ferrous sulfate 325mg  BID with meals.  And advise possible constipation from iron, advise her to take stool softner colace 100mg  daily  (OTC)when she takes iron pills.

## 2020-04-09 NOTE — Progress Notes (Signed)
Hematology/Oncology Consult note [facility] Telephone:([phone] [phone]   Patient Care Team: Derinda Late, MD as PCP - General (Family Medicine)  REFERRING PROVIDER: Avie Arenas, PA-C  CHIEF COMPLAINTS/REASON FOR VISIT:  Evaluation of anemia  HISTORY OF PRESENTING ILLNESS:  Jenny [name] is a  86 y.o.  female with PMH listed below who was referred to me for evaluation of anemia Reviewed patient's recent labs that was done.  03/26/2020 labs revealed anemia with hemoglobin of 8.9, MCV of 88.  White count 13.3, beta count 238. I reviewed patient's previous lab records at [facility] clinic.  Patient has normal hemoglobin level at 13 on 01/13/2020.  Patient takes Eliquis for chronic anticoagulation. 03/25/2020, patient presented emergency room due to altered mental status, fall, epistaxis.  Eliquis was held.  Hemoglobin was 10.5 upon admission.  Patient was doing well until she suddenly developed lethargic and CT/MRI brain was obtained and there was evidence of embolic stroke on MRI.  Neurology was consulted and Eliquis was recommended to be resumed. Patient developed anemia after her epistaxis episode and hemoglobin appears to progressively decrease. Patient was referred to hematology for further evaluation She was accompanied by her daughter Shauna Hugh. Patient had hard hearing.  Main history was obtained from her daughter.   Associated signs and symptoms: Patient reports fatigue.  Per daughter, there is SOB with exertion, may be partially secondary to recent hospitalization event./Deconditioning.  Patient has a good appetite he denies any bloody or black bowel movements.  No recent colonoscopy.  EGD was done in 2016 which showed multiple gastric polyps and a biopsy showed fundic gland polyps    Review of Systems  Constitutional: Positive for fatigue. Negative for appetite change, chills, fever and unexpected weight change.  HENT:   Negative for  hearing loss and voice change.   Eyes: Negative for eye problems.  Respiratory: Positive for shortness of breath. Negative for chest tightness and cough.   Cardiovascular: Negative for chest pain.  Gastrointestinal: Negative for abdominal distention, abdominal pain and blood in stool.  Endocrine: Negative for hot flashes.  Genitourinary: Negative for difficulty urinating and frequency.   Musculoskeletal: Negative for arthralgias.  Skin: Negative for itching and rash.  Neurological: Negative for extremity weakness.  Hematological: Negative for adenopathy.  Psychiatric/Behavioral: Negative for confusion.     MEDICAL HISTORY:  Past Medical History:  Diagnosis Date  . Depression   . Dyspnea   . Dysrhythmia   . GERD (gastroesophageal reflux disease)   . HBP (high blood pressure)   . Reflux   . Skin cancer   . Thyroid disease     SURGICAL HISTORY: Past Surgical History:  Procedure Laterality Date  . BREAST BIOPSY Right   . CARDIOVERSION N/A 04/07/2018   Procedure: CARDIOVERSION;  Surgeon: Teodoro Spray, MD;  Location: [facility] ORS;  Service: Cardiovascular;  Laterality: N/A;  . ESOPHAGOGASTRODUODENOSCOPY (EGD) WITH PROPOFOL N/A 04/26/2015   Procedure: ESOPHAGOGASTRODUODENOSCOPY (EGD) WITH PROPOFOL;  Surgeon: Manya Silvas, MD;  Location: [facility] ENDOSCOPY;  Service: Endoscopy;  Laterality: N/A;  . KNEE ARTHROSCOPY WITH LATERAL MENISECTOMY Left 03/22/2019   Procedure: KNEE ARTHROSCOPY WITH PARTIAL, MEDIAL AND LATERAL MENISECTOMY;  Surgeon: Hessie Knows, MD;  Location: [facility] ORS;  Service: Orthopedics;  Laterality: Left;  . NECK SURGERY      SOCIAL HISTORY: Social History   Socioeconomic History  . Marital status: Married    Spouse name: Not on file  . Number of children: Not on file  . Years of education:  Not on file  . Highest education level: Not on file  Occupational History  . Not on file  Tobacco Use  . Smoking status: Never Smoker  . Smokeless tobacco: Never Used    Vaping Use  . Vaping Use: Never used  Substance and Sexual Activity  . Alcohol use: No    Alcohol/week: 0.0 standard drinks  . Drug use: No  . Sexual activity: Not on file  Other Topics Concern  . Not on file  Social History Narrative  . Not on file   Social Determinants of Health   Financial Resource Strain:   . Difficulty of Paying Living Expenses: Not on file  Food Insecurity:   . Worried About Charity fundraiser in the Last Year: Not on file  . Ran Out of Food in the Last Year: Not on file  Transportation Needs:   . Lack of Transportation (Medical): Not on file  . Lack of Transportation (Non-Medical): Not on file  Physical Activity:   . Days of Exercise per Week: Not on file  . Minutes of Exercise per Session: Not on file  Stress:   . Feeling of Stress : Not on file  Social Connections:   . Frequency of Communication with Friends and Family: Not on file  . Frequency of Social Gatherings with Friends and Family: Not on file  . Attends Religious Services: Not on file  . Active Member of Clubs or Organizations: Not on file  . Attends Archivist Meetings: Not on file  . Marital Status: Not on file  Intimate Partner Violence:   . Fear of Current or Ex-Partner: Not on file  . Emotionally Abused: Not on file  . Physically Abused: Not on file  . Sexually Abused: Not on file    FAMILY HISTORY: History reviewed. No pertinent family history.  ALLERGIES:  is allergic to flagyl [metronidazole].  MEDICATIONS:  Current Outpatient Medications  Medication Sig Dispense Refill  . acetaminophen (TYLENOL) 500 MG tablet Take 500 mg by mouth every 6 (six) hours as needed for mild pain, fever or headache.     Marland Kitchen apixaban (ELIQUIS) 5 MG TABS tablet Take 1 tablet (5 mg total) by mouth 2 (two) times daily. 60 tablet 3  . atorvastatin (LIPITOR) 10 MG tablet Take 10 mg by mouth daily.    . busPIRone (BUSPAR) 10 MG tablet Take by mouth.    . cholecalciferol (VITAMIN D3) 25 MCG  (1000 UNIT) tablet Take 1,000 Units by mouth daily.    . DULoxetine (CYMBALTA) 60 MG capsule Take 120 mg by mouth daily.     Marland Kitchen levothyroxine (SYNTHROID) 100 MCG tablet Take 100 mcg by mouth daily before breakfast.    . Multiple Vitamins-Minerals (MULTIVITAMIN PO) Take 1 tablet by mouth daily.     . pantoprazole (PROTONIX) 40 MG tablet Take 40 mg by mouth 2 (two) times daily.      No current facility-administered medications for this visit.     PHYSICAL EXAMINATION: ECOG PERFORMANCE STATUS: 2 - Symptomatic, <50% confined to bed Vitals:   04/09/20 1117  BP: (!) 145/81  Pulse: 75  Resp: 16  Temp: (!) 96.9 F (36.1 C)   There were no vitals filed for this visit.  Physical Exam Constitutional:      General: She is not in acute distress.    Comments: Patient states in the wheelchair.  HENT:     Head: Normocephalic and atraumatic.  Eyes:     General: No scleral icterus.  Cardiovascular:     Rate and Rhythm: Normal rate and regular rhythm.     Heart sounds: Normal heart sounds.  Pulmonary:     Effort: Pulmonary effort is normal. No respiratory distress.     Breath sounds: No wheezing.  Abdominal:     General: Bowel sounds are normal. There is no distension.     Palpations: Abdomen is soft.  Musculoskeletal:        General: No deformity. Normal range of motion.     Cervical back: Normal range of motion and neck supple.  Skin:    General: Skin is warm and dry.     Findings: No erythema or rash.  Neurological:     Mental Status: She is alert. Mental status is at baseline.     Cranial Nerves: No cranial nerve deficit.     Coordination: Coordination normal.     Comments: Hearing loss  Psychiatric:        Mood and Affect: Mood normal.      LABORATORY DATA:  I have reviewed the data as listed Lab Results  Component Value Date   WBC 13.3 (H) 03/26/2020   HGB 9.2 (L) 03/26/2020   HCT 28.2 (L) 03/26/2020   MCV 88.0 03/26/2020   PLT 238 03/26/2020   Recent Labs     03/24/20 0507 03/25/20 0537  NA 136 139  K 4.5 4.2  CL 102 103  CO2 28 28  GLUCOSE 153* 133*  BUN 25* 43*  CREATININE 1.02* 0.89  CALCIUM 8.6* 8.8*  GFRNONAA 49* 57*  GFRAA 56* >60  PROT 6.6  --   ALBUMIN 3.6  --   AST 22  --   ALT 19  --   ALKPHOS 25*  --   BILITOT 0.5  --    Iron/TIBC/Ferritin/ %Sat    Component Value Date/Time   IRON 57 03/26/2020 0551   TIBC 266 03/26/2020 0551   FERRITIN 35 03/26/2020 0551   IRONPCTSAT 21 03/26/2020 0551        ASSESSMENT & PLAN:  1. Normocytic anemia   2. Iron deficiency anemia due to chronic blood loss    Anemia:  Labs are reviewed and discussed with patient.  Acute anemia after recent epistaxis, stroke events. 03/26/2020, iron panel showed iron saturation 21, ferritin 35.  Vitamin B12 level is 325.  TSH was recently checked and was normal. Check CBC with differential, pathology smear, CMP, folate,LDH, haptoglobin, monoclonal gammopathy evaluation.   Given that vitamin B12 level is on the low normal end, recommend patient to take vitamin B12 1000 MCG daily for 4 weeks.  Repeat B12 the next visit. Low reticulocyte panel, consistent with iron deficiency anemia.  Recent iron panel is likely falsely elevated due to acute issues.  I recommend patient to be started on oral iron supplementation ferrous sulfate 325 mg twice daily. Recommend stool softener if constipation. Orders Placed This Encounter  Procedures  . CBC with Differential/Platelet    Standing Status:   Future    Standing Expiration Date:   04/09/2021  . Retic Panel    Standing Status:   Future    Standing Expiration Date:   04/09/2021  . Multiple Myeloma Panel (SPEP&IFE w/QIG)    Standing Status:   Future    Standing Expiration Date:   04/09/2021  . Kappa/lambda light chains    Standing Status:   Future    Standing Expiration Date:   04/09/2021  . Lactate dehydrogenase    Standing Status:  Future    Standing Expiration Date:   04/09/2021  . Pathologist smear review     Standing Status:   Future    Standing Expiration Date:   04/09/2021  . Comprehensive metabolic panel    Standing Status:   Future    Standing Expiration Date:   04/09/2021    All questions were answered. The patient knows to call the clinic with any problems questions or concerns. Cc. Avie Arenas, PA-C  Return of visit: 2 weeks Thank you for this kind referral and the opportunity to participate in the care of this patient. A copy of today's note is routed to referring provider    Earlie Server, MD, PhD 04/09/2020

## 2020-04-09 NOTE — Telephone Encounter (Signed)
Patient's daughter, Shauna Hugh, notified.

## 2020-04-09 NOTE — Progress Notes (Signed)
New patient hematology.

## 2020-04-09 NOTE — Telephone Encounter (Signed)
Error

## 2020-04-10 LAB — KAPPA/LAMBDA LIGHT CHAINS
Kappa free light chain: 23.8 mg/L — ABNORMAL HIGH (ref 3.3–19.4)
Kappa, lambda light chain ratio: 1.38 (ref 0.26–1.65)
Lambda free light chains: 17.3 mg/L (ref 5.7–26.3)

## 2020-04-11 LAB — MULTIPLE MYELOMA PANEL, SERUM
Albumin SerPl Elph-Mcnc: 3.5 g/dL (ref 2.9–4.4)
Albumin/Glob SerPl: 1.2 (ref 0.7–1.7)
Alpha 1: 0.4 g/dL (ref 0.0–0.4)
Alpha2 Glob SerPl Elph-Mcnc: 1 g/dL (ref 0.4–1.0)
B-Globulin SerPl Elph-Mcnc: 0.8 g/dL (ref 0.7–1.3)
Gamma Glob SerPl Elph-Mcnc: 0.7 g/dL (ref 0.4–1.8)
Globulin, Total: 3 g/dL (ref 2.2–3.9)
IgA: 100 mg/dL (ref 64–422)
IgG (Immunoglobin G), Serum: 663 mg/dL (ref 586–1602)
IgM (Immunoglobulin M), Srm: 266 mg/dL — ABNORMAL HIGH (ref 26–217)
Total Protein ELP: 6.5 g/dL (ref 6.0–8.5)

## 2020-04-13 ENCOUNTER — Telehealth: Payer: Self-pay | Admitting: Nurse Practitioner

## 2020-04-13 NOTE — Telephone Encounter (Signed)
Called patient to schedule Palliative Consult and spoke with daughter, Luanna Cole, and she was in agreement with scheduling visit with NP.  I have scheduled an In-person Consult for 04/23/20 @ 3 PM.

## 2020-04-23 ENCOUNTER — Inpatient Hospital Stay (HOSPITAL_BASED_OUTPATIENT_CLINIC_OR_DEPARTMENT_OTHER): Payer: Medicare Other | Admitting: Oncology

## 2020-04-23 ENCOUNTER — Encounter: Payer: Self-pay | Admitting: Oncology

## 2020-04-23 ENCOUNTER — Other Ambulatory Visit: Payer: Self-pay

## 2020-04-23 ENCOUNTER — Other Ambulatory Visit: Payer: Medicare Other | Admitting: Nurse Practitioner

## 2020-04-23 ENCOUNTER — Encounter: Payer: Self-pay | Admitting: Nurse Practitioner

## 2020-04-23 DIAGNOSIS — N1831 Chronic kidney disease, stage 3a: Secondary | ICD-10-CM | POA: Diagnosis not present

## 2020-04-23 DIAGNOSIS — D500 Iron deficiency anemia secondary to blood loss (chronic): Secondary | ICD-10-CM

## 2020-04-23 DIAGNOSIS — I6349 Cerebral infarction due to embolism of other cerebral artery: Secondary | ICD-10-CM

## 2020-04-23 DIAGNOSIS — Z515 Encounter for palliative care: Secondary | ICD-10-CM

## 2020-04-23 HISTORY — DX: Iron deficiency anemia secondary to blood loss (chronic): D50.0

## 2020-04-23 NOTE — Progress Notes (Signed)
[facility] Consult Note Telephone: [phone]  Fax: [phone]  PATIENT NAME: JAYLAN DUGGAR DOB: 1928/03/16 MRN: [id number]  PRIMARY CARE PROVIDER:   Derinda Late, MD  REFERRING PROVIDER:  Derinda Late, MD [phone] [facility] [place],  [place] [address]   I was asked by Dr Baldemar Lenis to see Ms. Varney for Palliative consult for goals of care  RESPONSIBLE PARTY:    Luanna Cole (daughter) [phone] Porfirio Mylar (daughter) [phone] Felisha Claytor (son) [phone]  1. Advance Care Planning; DNR; MOST form completed, placed in vynca  2. Goals of Care: Goals include to maximize quality of life and symptom management. Our advance care planning conversation included a discussion about:     The value and importance of advance care planning   Exploration of personal, cultural or spiritual beliefs that might influence medical decisions   Exploration of goals of care in the event of a sudden injury or illness   Identification and preparation of a healthcare agent   Review and updating or creation of an  advance directive document.  3. Palliative care encounter; Palliative care encounter; Palliative medicine team will continue to support patient, patient's family, and medical team. Visit consisted of counseling and education dealing with the complex and emotionally intense issues of symptom management and palliative care in the setting of serious and potentially life-threatening illness  4. f/u 4 weeks for ongoing monitoring of cognitive decline, functional weakness  I spent 90 minutes providing this consultation,  from 2:45pm to 4:15pm. More than 50% of the time in this consultation was spent coordinating communication.   HISTORY OF PRESENT ILLNESS:  [place] P Capell is a 86 y.o. year old female with multiple medical problems including Embolitic stroke, atrial fibrillation, cerebral  artery disease, hypothyroidism, diverticulitis of colon, GERD, hyperlipidemia. Hospitalized 9 / 18/2021 to 9 / 21 / 2021 for severe epistaxis, altered mental status confusion and they fall at home. She was prescribed omnicef for presumed UTI a few days prior to admission. Atenolol was held due to bradycardia. She did have in-home therapy with physical therapy. CT brain and aymara brain obtained and there was evidence of an embolic stroke on the MRI. Neurology consulted resuming eliquis for stroke prophylaxis since epistaxis has resolved. Mrs Amey was discharged home with [place] for physical therapy. Initial Palliative Care visit with Mrs Glace and daughter Shauna Hugh. We talked about purpose of palliative care. Mrs Bieker and Shauna Hugh in agreement. We talked about past medical history including recent hospitalization within embolitic stroke. We talked about challenges with cognition as Mrs. Yearwood does have some times when she is more confused than others. Diane endorses Mrs Parrillo has been very nice, thankful in her expression since the stroke. We talked about episode of epistaxis no bleeding since. We talked about hematology appointment today with gold to continue on oral iron for which she is tolerating, avoiding infusions if possible. We talked about medical goals of care. Diane endorses wishes are more conservative path treat what is treatable though no heroic Measures. Mrs Hlavaty is a DNR. [place] form redone as the one that was on the refrigerator in their home had the wrong first name. We reviewed the most form and talked about different scenarios. Wishes are for DNR, limited additional treatment options with no intubation, no feeding tube but wishes are for IV fluids and antibiotics, hospitalization if needed though avoid ICU admissions. We talked about current home  situation with 24 hour care givers caring for mr current home situation with 24 hour care givers caring for Mr. and Mrs. Herda. Diane  endorse is she, her sister Almyra Free and brother are rotating staying with their parents, meal preparation. We talked Mrs Gartin his appetite being very good. Diane endorses Mrs Pardue has been trying to see candy. They have been using candy as a reward for doing therapy but in small quantities. Diane endorses Mrs Morneault is very focused on getting candy. We talked about diabetes currently control with diet and they have been very diligent about monitoring her intake. We talked about symptoms no pain, shortness of breath but she does continue to have weakness on the left side. Mrs. Fenter does require assistance for helping up from a chair to a standing position. Mrs. Graef does use a rolaid walker. No falls since she has been home. We talked about role of palliative care in plan of care. Therapeutic listening and emotional support provided. Contact information. Questions answered to satisfaction. Discuss that will follow up in 4 weeks for ongoing monitoring of cognitive decline to see if this is new baseline or Mrs Santillano improves. Diane in agreement and appointments scheduled. Therapeutic listening, emotional support provided. [place] book given for review. Questions answered the satisfaction. Contact information provided.  Palliative Care was asked to help address goals of care.   CODE STATUS: DNR  PPS: 50% HOSPICE ELIGIBILITY/DIAGNOSIS: TBD  PAST MEDICAL HISTORY:  Past Medical History:  Diagnosis Date  . Depression   . Dyspnea   . Dysrhythmia   . GERD (gastroesophageal reflux disease)   . HBP (high blood pressure)   . Reflux   . Skin cancer   . Thyroid disease     SOCIAL HX:  Social History   Tobacco Use  . Smoking status: Never Smoker  . Smokeless tobacco: Never Used  Substance Use Topics  . Alcohol use: No    Alcohol/week: 0.0 standard drinks    ALLERGIES:  Allergies  Allergen Reactions  . Flagyl [Metronidazole] Nausea And Vomiting     PERTINENT MEDICATIONS:  Outpatient  Encounter Medications as of 04/23/2020  Medication Sig  . acetaminophen (TYLENOL) 500 MG tablet Take 500 mg by mouth every 6 (six) hours as needed for mild pain, fever or headache.   Marland Kitchen apixaban (ELIQUIS) 5 MG TABS tablet Take 1 tablet (5 mg total) by mouth 2 (two) times daily.  Marland Kitchen atorvastatin (LIPITOR) 10 MG tablet Take 10 mg by mouth daily.  . busPIRone (BUSPAR) 10 MG tablet Take by mouth.  . cholecalciferol (VITAMIN D3) 25 MCG (1000 UNIT) tablet Take 1,000 Units by mouth daily.  . DULoxetine (CYMBALTA) 60 MG capsule Take 120 mg by mouth daily.   . ferrous sulfate 325 (65 FE) MG EC tablet Take 1 tablet (325 mg total) by mouth 3 (three) times daily with meals.  Marland Kitchen levothyroxine (SYNTHROID) 100 MCG tablet Take 100 mcg by mouth daily before breakfast.  . Multiple Vitamins-Minerals (MULTIVITAMIN PO) Take 1 tablet by mouth daily.   . pantoprazole (PROTONIX) 40 MG tablet Take 40 mg by mouth 2 (two) times daily.    No facility-administered encounter medications on file as of 04/23/2020.    PHYSICAL EXAM:   General: fragile, elderly, pleasantly female Neurological: generalized weakness; walks with walker  Lakechia Nay Ihor Gully, NP

## 2020-04-23 NOTE — Addendum Note (Signed)
Addended by: Earlie Server on: 04/23/2020 10:23 PM   Modules accepted: Orders

## 2020-04-23 NOTE — Progress Notes (Signed)
HEMATOLOGY-ONCOLOGY TeleHEALTH VISIT PROGRESS NOTE  I connected with [name] on 04/23/20 at  2:30 PM EDT by video enabled telemedicine visit and verified that I am speaking with the correct person using two identifiers. I discussed the limitations, risks, security and privacy concerns of performing an evaluation and management service by telemedicine and the availability of in-person appointments. I also discussed with the patient that there may be a patient responsible charge related to this service. The patient expressed understanding and agreed to proceed.   Other persons participating in the visit and their role in the encounter:  Daughter Jenny [name] who sets up virtual visit.  Patient's location: Home  Provider's location: office Chief Complaint: Follow-up for anemia.   INTERVAL HISTORY Jenny [name] is a 86 y.o. female who has above history reviewed by me today presents for follow up visit for management of anemia  problems and complaints are listed below:  Patient has been taking oral iron supplementation 3 times a day.  She tolerates well.  No significant side effects. She had blood work done 2 weeks ago and presented virtually to discuss results and management plan.  No new complaints.  Chronic fatigue.  Review of Systems  Constitutional: Positive for fatigue. Negative for appetite change, chills and fever.  HENT:   Negative for hearing loss and voice change.   Eyes: Negative for eye problems.  Respiratory: Negative for chest tightness and cough.   Cardiovascular: Negative for chest pain.  Gastrointestinal: Negative for abdominal distention, abdominal pain and blood in stool.  Endocrine: Negative for hot flashes.  Genitourinary: Negative for difficulty urinating and frequency.   Musculoskeletal: Negative for arthralgias.  Skin: Negative for itching and rash.  Neurological: Negative for extremity weakness.  Hematological: Negative for adenopathy.  Psychiatric/Behavioral:  Negative for confusion.    Past Medical History:  Diagnosis Date  . Depression   . Dyspnea   . Dysrhythmia   . GERD (gastroesophageal reflux disease)   . HBP (high blood pressure)   . Reflux   . Skin cancer   . Thyroid disease    Past Surgical History:  Procedure Laterality Date  . BREAST BIOPSY Right   . CARDIOVERSION N/A 04/07/2018   Procedure: CARDIOVERSION;  Surgeon: Teodoro Spray, MD;  Location: [facility] ORS;  Service: Cardiovascular;  Laterality: N/A;  . ESOPHAGOGASTRODUODENOSCOPY (EGD) WITH PROPOFOL N/A 04/26/2015   Procedure: ESOPHAGOGASTRODUODENOSCOPY (EGD) WITH PROPOFOL;  Surgeon: Manya Silvas, MD;  Location: [facility] ENDOSCOPY;  Service: Endoscopy;  Laterality: N/A;  . KNEE ARTHROSCOPY WITH LATERAL MENISECTOMY Left 03/22/2019   Procedure: KNEE ARTHROSCOPY WITH PARTIAL, MEDIAL AND LATERAL MENISECTOMY;  Surgeon: Hessie Knows, MD;  Location: [facility] ORS;  Service: Orthopedics;  Laterality: Left;  . NECK SURGERY      History reviewed. No pertinent family history.  Social History   Socioeconomic History  . Marital status: Married    Spouse name: Not on file  . Number of children: Not on file  . Years of education: Not on file  . Highest education level: Not on file  Occupational History  . Not on file  Tobacco Use  . Smoking status: Never Smoker  . Smokeless tobacco: Never Used  Vaping Use  . Vaping Use: Never used  Substance and Sexual Activity  . Alcohol use: No    Alcohol/week: 0.0 standard drinks  . Drug use: No  . Sexual activity: Not on file  Other Topics Concern  . Not on file  Social History Narrative  . Not on  file   Social Determinants of Health   Financial Resource Strain:   . Difficulty of Paying Living Expenses: Not on file  Food Insecurity:   . Worried About Charity fundraiser in the Last Year: Not on file  . Ran Out of Food in the Last Year: Not on file  Transportation Needs:   . Lack of Transportation (Medical): Not on file  . Lack of  Transportation (Non-Medical): Not on file  Physical Activity:   . Days of Exercise per Week: Not on file  . Minutes of Exercise per Session: Not on file  Stress:   . Feeling of Stress : Not on file  Social Connections:   . Frequency of Communication with Friends and Family: Not on file  . Frequency of Social Gatherings with Friends and Family: Not on file  . Attends Religious Services: Not on file  . Active Member of Clubs or Organizations: Not on file  . Attends Archivist Meetings: Not on file  . Marital Status: Not on file  Intimate Partner Violence:   . Fear of Current or Ex-Partner: Not on file  . Emotionally Abused: Not on file  . Physically Abused: Not on file  . Sexually Abused: Not on file    Current Outpatient Medications on File Prior to Visit  Medication Sig Dispense Refill  . acetaminophen (TYLENOL) 500 MG tablet Take 500 mg by mouth every 6 (six) hours as needed for mild pain, fever or headache.     Marland Kitchen apixaban (ELIQUIS) 5 MG TABS tablet Take 1 tablet (5 mg total) by mouth 2 (two) times daily. 60 tablet 3  . atorvastatin (LIPITOR) 10 MG tablet Take 10 mg by mouth daily.    . busPIRone (BUSPAR) 10 MG tablet Take by mouth.    . cholecalciferol (VITAMIN D3) 25 MCG (1000 UNIT) tablet Take 1,000 Units by mouth daily.    . DULoxetine (CYMBALTA) 60 MG capsule Take 120 mg by mouth daily.     . ferrous sulfate 325 (65 FE) MG EC tablet Take 1 tablet (325 mg total) by mouth 3 (three) times daily with meals. 60 tablet 1  . levothyroxine (SYNTHROID) 100 MCG tablet Take 100 mcg by mouth daily before breakfast.    . Multiple Vitamins-Minerals (MULTIVITAMIN PO) Take 1 tablet by mouth daily.     . pantoprazole (PROTONIX) 40 MG tablet Take 40 mg by mouth 2 (two) times daily.      No current facility-administered medications on file prior to visit.    Allergies  Allergen Reactions  . Flagyl [Metronidazole] Nausea And Vomiting       Observations/Objective: Today's Vitals    04/23/20 1357  PainSc: 0-No pain   There is no height or weight on file to calculate BMI.  Physical Exam Neurological:     Mental Status: She is alert.     CBC    Component Value Date/Time   WBC 8.1 04/09/2020 1156   RBC 2.90 (L) 04/09/2020 1156   RBC 2.91 (L) 04/09/2020 1156   HGB 8.1 (L) 04/09/2020 1156   HCT 25.9 (L) 04/09/2020 1156   PLT 480 (H) 04/09/2020 1156   MCV 89.0 04/09/2020 1156   MCH 27.8 04/09/2020 1156   MCHC 31.3 04/09/2020 1156   RDW 15.6 (H) 04/09/2020 1156   LYMPHSABS 1.0 04/09/2020 1156   MONOABS 0.7 04/09/2020 1156   EOSABS 0.3 04/09/2020 1156   BASOSABS 0.0 04/09/2020 1156    CMP     Component Value  Date/Time   NA 137 04/09/2020 1156   K 4.7 04/09/2020 1156   CL 102 04/09/2020 1156   CO2 27 04/09/2020 1156   GLUCOSE 158 (H) 04/09/2020 1156   BUN 20 04/09/2020 1156   CREATININE 1.00 04/09/2020 1156   CALCIUM 8.9 04/09/2020 1156   PROT 7.2 04/09/2020 1156   ALBUMIN 3.7 04/09/2020 1156   AST 23 04/09/2020 1156   ALT 16 04/09/2020 1156   ALKPHOS 30 (L) 04/09/2020 1156   BILITOT 0.6 04/09/2020 1156   GFRNONAA 50 (L) 04/09/2020 1156   GFRAA 58 (L) 04/09/2020 1156     Assessment and Plan: 1. Iron deficiency anemia due to chronic blood loss   2. Stage 3a chronic kidney disease (HCC)     #Iron deficiency anemia Labs are reviewed and discussed with patient and her daughter. Discussed about option of continue oral iron supplementation and see how she does versus to proceed with IV Venofer treatments.  Rationale and potential side effects of IV Venofer treatments were discussed in details with patient. Patient prefers to continue oral iron supplementation. We will check CBC iron labs in 2 weeks, 4 weeks, and if she can follow-up with me in 6 weeks. If iron level/hemoglobin level continue to get worse, patient is open to the IV Venofer treatment option.  Chronic kidney disease, stage III, this probably also contributed to her anemia. Recommend  to avoid nephrotoxins. Target hemoglobin level is above 10.  Recommend IV Venofer treatments to further improve iron panel.  Patient prefers to proceed with oral iron supplementation at this point and see she how she does.  See above.   Follow Up Instructions: 6 weeks  I discussed the assessment and treatment plan with the patient. The patient was provided an opportunity to ask questions and all were answered. The patient agreed with the plan and demonstrated an understanding of the instructions.  The patient was advised to call back or seek an in-person evaluation if the symptoms worsen or if the condition fails to improve as anticipated.     Earlie Server, MD 04/23/2020 10:10 PM

## 2020-04-23 NOTE — Progress Notes (Signed)
Patient contacted for mychart visit. No new concerns voiced.

## 2020-05-07 ENCOUNTER — Other Ambulatory Visit: Payer: Self-pay

## 2020-05-07 ENCOUNTER — Inpatient Hospital Stay: Payer: Medicare Other | Attending: Oncology

## 2020-05-07 DIAGNOSIS — N1831 Chronic kidney disease, stage 3a: Secondary | ICD-10-CM | POA: Insufficient documentation

## 2020-05-07 DIAGNOSIS — D509 Iron deficiency anemia, unspecified: Secondary | ICD-10-CM | POA: Diagnosis present

## 2020-05-07 DIAGNOSIS — D500 Iron deficiency anemia secondary to blood loss (chronic): Secondary | ICD-10-CM

## 2020-05-07 LAB — CBC WITH DIFFERENTIAL/PLATELET
Abs Immature Granulocytes: 0.03 10*3/uL (ref 0.00–0.07)
Basophils Absolute: 0.1 10*3/uL (ref 0.0–0.1)
Basophils Relative: 1 %
Eosinophils Absolute: 0.2 10*3/uL (ref 0.0–0.5)
Eosinophils Relative: 2 %
HCT: 37.8 % (ref 36.0–46.0)
Hemoglobin: 11.8 g/dL — ABNORMAL LOW (ref 12.0–15.0)
Immature Granulocytes: 0 %
Lymphocytes Relative: 20 %
Lymphs Abs: 1.6 10*3/uL (ref 0.7–4.0)
MCH: 28.4 pg (ref 26.0–34.0)
MCHC: 31.2 g/dL (ref 30.0–36.0)
MCV: 90.9 fL (ref 80.0–100.0)
Monocytes Absolute: 0.9 10*3/uL (ref 0.1–1.0)
Monocytes Relative: 10 %
Neutro Abs: 5.4 10*3/uL (ref 1.7–7.7)
Neutrophils Relative %: 67 %
Platelets: 312 10*3/uL (ref 150–400)
RBC: 4.16 MIL/uL (ref 3.87–5.11)
RDW: 15.9 % — ABNORMAL HIGH (ref 11.5–15.5)
WBC: 8.2 10*3/uL (ref 4.0–10.5)
nRBC: 0 % (ref 0.0–0.2)

## 2020-05-07 LAB — FERRITIN: Ferritin: 26 ng/mL (ref 11–307)

## 2020-05-07 LAB — RETIC PANEL
Immature Retic Fract: 9.1 % (ref 2.3–15.9)
RBC.: 4.24 MIL/uL (ref 3.87–5.11)
Retic Count, Absolute: 53.8 10*3/uL (ref 19.0–186.0)
Retic Ct Pct: 1.3 % (ref 0.4–3.1)
Reticulocyte Hemoglobin: 30.4 pg (ref >27.9)

## 2020-05-07 LAB — IRON AND TIBC
Iron: 74 ug/dL (ref 28–170)
Saturation Ratios: 22 % (ref 10.4–31.8)
TIBC: 343 ug/dL (ref 250–450)
UIBC: 269 ug/dL

## 2020-05-15 ENCOUNTER — Other Ambulatory Visit: Payer: Self-pay | Admitting: Oncology

## 2020-05-21 ENCOUNTER — Inpatient Hospital Stay: Payer: Medicare Other

## 2020-05-21 DIAGNOSIS — D509 Iron deficiency anemia, unspecified: Secondary | ICD-10-CM | POA: Diagnosis not present

## 2020-05-21 DIAGNOSIS — D500 Iron deficiency anemia secondary to blood loss (chronic): Secondary | ICD-10-CM

## 2020-05-21 LAB — CBC WITH DIFFERENTIAL/PLATELET
Abs Immature Granulocytes: 0.03 10*3/uL (ref 0.00–0.07)
Basophils Absolute: 0 10*3/uL (ref 0.0–0.1)
Basophils Relative: 1 %
Eosinophils Absolute: 0.3 10*3/uL (ref 0.0–0.5)
Eosinophils Relative: 4 %
HCT: 40.7 % (ref 36.0–46.0)
Hemoglobin: 12.9 g/dL (ref 12.0–15.0)
Immature Granulocytes: 0 %
Lymphocytes Relative: 20 %
Lymphs Abs: 1.7 10*3/uL (ref 0.7–4.0)
MCH: 27.9 pg (ref 26.0–34.0)
MCHC: 31.7 g/dL (ref 30.0–36.0)
MCV: 87.9 fL (ref 80.0–100.0)
Monocytes Absolute: 0.8 10*3/uL (ref 0.1–1.0)
Monocytes Relative: 9 %
Neutro Abs: 5.6 10*3/uL (ref 1.7–7.7)
Neutrophils Relative %: 66 %
Platelets: 297 10*3/uL (ref 150–400)
RBC: 4.63 MIL/uL (ref 3.87–5.11)
RDW: 15.8 % — ABNORMAL HIGH (ref 11.5–15.5)
WBC: 8.4 10*3/uL (ref 4.0–10.5)
nRBC: 0 % (ref 0.0–0.2)

## 2020-05-21 LAB — RETIC PANEL
Immature Retic Fract: 7.7 % (ref 2.3–15.9)
RBC.: 4.6 MIL/uL (ref 3.87–5.11)
Retic Count, Absolute: 43.2 10*3/uL (ref 19.0–186.0)
Retic Ct Pct: 0.9 % (ref 0.4–3.1)
Reticulocyte Hemoglobin: 31.1 pg (ref >27.9)

## 2020-05-22 ENCOUNTER — Inpatient Hospital Stay: Payer: Medicare Other

## 2020-05-23 ENCOUNTER — Telehealth: Payer: Self-pay | Admitting: Nurse Practitioner

## 2020-05-23 NOTE — Telephone Encounter (Signed)
Spoke with patient's caregiver and have rescheduled the Palliative f/u visit to 07/10/20 @ 2 PM.

## 2020-06-02 ENCOUNTER — Encounter: Payer: Self-pay | Admitting: Oncology

## 2020-06-04 ENCOUNTER — Other Ambulatory Visit: Payer: Medicare Other | Admitting: Nurse Practitioner

## 2020-06-04 ENCOUNTER — Inpatient Hospital Stay: Payer: Medicare Other

## 2020-06-04 DIAGNOSIS — D500 Iron deficiency anemia secondary to blood loss (chronic): Secondary | ICD-10-CM

## 2020-06-04 DIAGNOSIS — D509 Iron deficiency anemia, unspecified: Secondary | ICD-10-CM | POA: Diagnosis not present

## 2020-06-04 LAB — CBC WITH DIFFERENTIAL/PLATELET
Abs Immature Granulocytes: 0.02 10*3/uL (ref 0.00–0.07)
Basophils Absolute: 0.1 10*3/uL (ref 0.0–0.1)
Basophils Relative: 1 %
Eosinophils Absolute: 0.2 10*3/uL (ref 0.0–0.5)
Eosinophils Relative: 2 %
HCT: 45.6 % (ref 36.0–46.0)
Hemoglobin: 14.3 g/dL (ref 12.0–15.0)
Immature Granulocytes: 0 %
Lymphocytes Relative: 20 %
Lymphs Abs: 1.6 10*3/uL (ref 0.7–4.0)
MCH: 27.7 pg (ref 26.0–34.0)
MCHC: 31.4 g/dL (ref 30.0–36.0)
MCV: 88.4 fL (ref 80.0–100.0)
Monocytes Absolute: 0.7 10*3/uL (ref 0.1–1.0)
Monocytes Relative: 9 %
Neutro Abs: 5.3 10*3/uL (ref 1.7–7.7)
Neutrophils Relative %: 68 %
Platelets: 315 10*3/uL (ref 150–400)
RBC: 5.16 MIL/uL — ABNORMAL HIGH (ref 3.87–5.11)
RDW: 15.8 % — ABNORMAL HIGH (ref 11.5–15.5)
WBC: 7.7 10*3/uL (ref 4.0–10.5)
nRBC: 0 % (ref 0.0–0.2)

## 2020-06-04 LAB — RETIC PANEL
Immature Retic Fract: 7.9 % (ref 2.3–15.9)
RBC.: 5.14 MIL/uL — ABNORMAL HIGH (ref 3.87–5.11)
Retic Count, Absolute: 54.5 10*3/uL (ref 19.0–186.0)
Retic Ct Pct: 1.1 % (ref 0.4–3.1)
Reticulocyte Hemoglobin: 31.7 pg (ref >27.9)

## 2020-06-04 LAB — IRON AND TIBC
Iron: 101 ug/dL (ref 28–170)
Saturation Ratios: 30 % (ref 10.4–31.8)
TIBC: 342 ug/dL (ref 250–450)
UIBC: 241 ug/dL

## 2020-06-04 LAB — FERRITIN: Ferritin: 30 ng/mL (ref 11–307)

## 2020-06-05 ENCOUNTER — Encounter: Payer: Self-pay | Admitting: Nurse Practitioner

## 2020-06-05 DIAGNOSIS — D649 Anemia, unspecified: Secondary | ICD-10-CM

## 2020-06-05 DIAGNOSIS — D500 Iron deficiency anemia secondary to blood loss (chronic): Secondary | ICD-10-CM

## 2020-06-06 ENCOUNTER — Inpatient Hospital Stay: Payer: Medicare Other | Attending: Oncology | Admitting: Oncology

## 2020-06-06 ENCOUNTER — Inpatient Hospital Stay: Payer: Medicare Other

## 2020-06-07 NOTE — Telephone Encounter (Signed)
Pts lab/MD/**NEW** +/- Venofer appts has been R/S 56mths out as requested.

## 2020-06-11 ENCOUNTER — Emergency Department
Admission: EM | Admit: 2020-06-11 | Discharge: 2020-06-15 | Disposition: A | Discharge status: Home / Self Care | Payer: Medicare Other | Attending: Student in an Organized Health Care Education/Training Program | Admitting: Student in an Organized Health Care Education/Training Program

## 2020-06-11 ENCOUNTER — Other Ambulatory Visit: Payer: Self-pay

## 2020-06-11 ENCOUNTER — Emergency Department: Payer: Medicare Other

## 2020-06-11 ENCOUNTER — Encounter: Payer: Self-pay | Admitting: Radiology

## 2020-06-11 DIAGNOSIS — Z20822 Contact with and (suspected) exposure to covid-19: Secondary | ICD-10-CM | POA: Insufficient documentation

## 2020-06-11 DIAGNOSIS — W010XXA Fall on same level from slipping, tripping and stumbling without subsequent striking against object, initial encounter: Secondary | ICD-10-CM | POA: Diagnosis not present

## 2020-06-11 DIAGNOSIS — Z7901 Long term (current) use of anticoagulants: Secondary | ICD-10-CM | POA: Insufficient documentation

## 2020-06-11 DIAGNOSIS — S92424A Nondisplaced fracture of distal phalanx of right great toe, initial encounter for closed fracture: Secondary | ICD-10-CM | POA: Diagnosis not present

## 2020-06-11 DIAGNOSIS — E039 Hypothyroidism, unspecified: Secondary | ICD-10-CM | POA: Diagnosis not present

## 2020-06-11 DIAGNOSIS — Z85828 Personal history of other malignant neoplasm of skin: Secondary | ICD-10-CM | POA: Diagnosis not present

## 2020-06-11 DIAGNOSIS — Z79899 Other long term (current) drug therapy: Secondary | ICD-10-CM | POA: Insufficient documentation

## 2020-06-11 DIAGNOSIS — Y92009 Unspecified place in unspecified non-institutional (private) residence as the place of occurrence of the external cause: Secondary | ICD-10-CM | POA: Insufficient documentation

## 2020-06-11 DIAGNOSIS — R531 Weakness: Secondary | ICD-10-CM | POA: Diagnosis not present

## 2020-06-11 DIAGNOSIS — I10 Essential (primary) hypertension: Secondary | ICD-10-CM | POA: Insufficient documentation

## 2020-06-11 DIAGNOSIS — S99921A Unspecified injury of right foot, initial encounter: Secondary | ICD-10-CM | POA: Diagnosis present

## 2020-06-11 LAB — CBC WITH DIFFERENTIAL/PLATELET
Abs Immature Granulocytes: 0.01 10*3/uL (ref 0.00–0.07)
Basophils Absolute: 0.1 10*3/uL (ref 0.0–0.1)
Basophils Relative: 1 %
Eosinophils Absolute: 0.2 10*3/uL (ref 0.0–0.5)
Eosinophils Relative: 3 %
HCT: 45.4 % (ref 36.0–46.0)
Hemoglobin: 14.2 g/dL (ref 12.0–15.0)
Immature Granulocytes: 0 %
Lymphocytes Relative: 25 %
Lymphs Abs: 2 10*3/uL (ref 0.7–4.0)
MCH: 27.5 pg (ref 26.0–34.0)
MCHC: 31.3 g/dL (ref 30.0–36.0)
MCV: 87.8 fL (ref 80.0–100.0)
Monocytes Absolute: 0.8 10*3/uL (ref 0.1–1.0)
Monocytes Relative: 11 %
Neutro Abs: 4.8 10*3/uL (ref 1.7–7.7)
Neutrophils Relative %: 60 %
Platelets: 273 10*3/uL (ref 150–400)
RBC: 5.17 MIL/uL — ABNORMAL HIGH (ref 3.87–5.11)
RDW: 15.3 % (ref 11.5–15.5)
WBC: 7.8 10*3/uL (ref 4.0–10.5)
nRBC: 0 % (ref 0.0–0.2)

## 2020-06-11 LAB — BASIC METABOLIC PANEL
Anion gap: 9 (ref 5–15)
BUN: 26 mg/dL — ABNORMAL HIGH (ref 8–23)
CO2: 29 mmol/L (ref 22–32)
Calcium: 9.6 mg/dL (ref 8.9–10.3)
Chloride: 101 mmol/L (ref 98–111)
Creatinine, Ser: 0.97 mg/dL (ref 0.44–1.00)
GFR, Estimated: 56 mL/min — ABNORMAL LOW (ref >60)
Glucose, Bld: 139 mg/dL — ABNORMAL HIGH (ref 70–99)
Potassium: 4 mmol/L (ref 3.5–5.1)
Sodium: 139 mmol/L (ref 135–145)

## 2020-06-11 LAB — PROTIME-INR
INR: 1.1 (ref 0.8–1.2)
Prothrombin Time: 13.8 seconds (ref 11.4–15.2)

## 2020-06-11 LAB — RESP PANEL BY RT-PCR (FLU A&B, COVID) ARPGX2
Influenza A by PCR: NEGATIVE
Influenza B by PCR: NEGATIVE
SARS Coronavirus 2 by RT PCR: NEGATIVE

## 2020-06-11 NOTE — ED Provider Notes (Incomplete)
[facility] Emergency Department Provider Note ____________________________________________   First MD Initiated Contact with Patient 06/11/20 2224     (approximate)  I have reviewed the triage vital signs and the nursing notes.   HISTORY  Chief Complaint Fall  HPI Jenny [name] is a 86 y.o. female with history of embolic stroke in September 2021, a fib, and other history as listed below presents to the emergency department for treatment and evaluation after fall today. Son states that over the past 2 weeks she has gone from ambulating with her walker to not ambulating without dragging the left leg and therefore unsteady even with walker. While going into the house after her eye appointment today, she stubbed her toe and slowly fell to the ground. Her son couldn't quite catch her. She did not hit the floor hard or hit her head. They had to get EMS to get her up. Family called Dr. Elzie Rings office and was advised to bring her to the ER immediately.     Past Medical History:  Diagnosis Date  . Depression   . Dyspnea   . Dysrhythmia   . GERD (gastroesophageal reflux disease)   . HBP (high blood pressure)   . Iron deficiency anemia due to chronic blood loss 04/23/2020  . Iron deficiency anemia due to chronic blood loss 04/23/2020  . Reflux   . Skin cancer   . Thyroid disease     Patient Active Problem List   Diagnosis Date Noted  . Iron deficiency anemia due to chronic blood loss 04/23/2020  . Embolic stroke ([place]) 25/11/3974  . Acute metabolic encephalopathy 73/41/9379  . Fall at home, initial encounter 03/24/2020  . AF (paroxysmal atrial fibrillation) ([place]) 03/24/2020  . Atrial fibrillation, chronic ([place]) 03/24/2020  . Hypothyroid 03/24/2020  . GERD (gastroesophageal reflux disease) 03/24/2020  . AMS (altered mental status) 03/24/2020  . Bradycardia 03/24/2020  . Severe epistaxis 03/24/2020  . Avitaminosis D 06/07/2015  . Back ache 11/22/2013   . Artery disease, cerebral 10/26/2013  . Diverticulitis of colon 10/26/2013  . Acid reflux 10/26/2013  . Calcium blood increased 10/26/2013  . HLD (hyperlipidemia) 10/26/2013    Past Surgical History:  Procedure Laterality Date  . BREAST BIOPSY Right   . CARDIOVERSION N/A 04/07/2018   Procedure: CARDIOVERSION;  Surgeon: Teodoro Spray, MD;  Location: [facility] ORS;  Service: Cardiovascular;  Laterality: N/A;  . ESOPHAGOGASTRODUODENOSCOPY (EGD) WITH PROPOFOL N/A 04/26/2015   Procedure: ESOPHAGOGASTRODUODENOSCOPY (EGD) WITH PROPOFOL;  Surgeon: Manya Silvas, MD;  Location: [facility] ENDOSCOPY;  Service: Endoscopy;  Laterality: N/A;  . KNEE ARTHROSCOPY WITH LATERAL MENISECTOMY Left 03/22/2019   Procedure: KNEE ARTHROSCOPY WITH PARTIAL, MEDIAL AND LATERAL MENISECTOMY;  Surgeon: Hessie Knows, MD;  Location: [facility] ORS;  Service: Orthopedics;  Laterality: Left;  . NECK SURGERY      Prior to Admission medications   Medication Sig Start Date End Date Taking? Authorizing Provider  acetaminophen (TYLENOL) 500 MG tablet Take 500 mg by mouth every 6 (six) hours as needed for mild pain, fever or headache.     [provider]  apixaban (ELIQUIS) 5 MG TABS tablet Take 1 tablet (5 mg total) by mouth 2 (two) times daily. 03/27/20   Teodoro Spray, MD  atorvastatin (LIPITOR) 10 MG tablet Take 10 mg by mouth daily. 01/19/20   [provider]  busPIRone (BUSPAR) 10 MG tablet Take by mouth. 04/03/20 04/03/21  [provider]  cholecalciferol (VITAMIN D3) 25 MCG (1000 UNIT) tablet Take 1,000  Units by mouth daily.    [provider]  DULoxetine (CYMBALTA) 60 MG capsule Take 120 mg by mouth daily.     [provider]  FEROSUL 325 (65 Fe) MG tablet TAKE 1 TABLET BY MOUTH 3 TIMES DAILY WITH MEALS 05/15/20   Earlie Server, MD  ferrous sulfate 325 (65 FE) MG EC tablet Take 1 tablet (325 mg total) by mouth 3 (three) times daily with meals. 04/09/20   Earlie Server, MD  levothyroxine (SYNTHROID)  100 MCG tablet Take 100 mcg by mouth daily before breakfast.    [provider]  Multiple Vitamins-Minerals (MULTIVITAMIN PO) Take 1 tablet by mouth daily.     [provider]  pantoprazole (PROTONIX) 40 MG tablet Take 40 mg by mouth 2 (two) times daily.  05/07/13   [provider]    Allergies Flagyl [metronidazole]  No family history on file.  Social History Social History   Tobacco Use  . Smoking status: Never Smoker  . Smokeless tobacco: Never Used  Vaping Use  . Vaping Use: Never used  Substance Use Topics  . Alcohol use: No    Alcohol/week: 0.0 standard drinks  . Drug use: No    Review of Systems  Constitutional: No fever/chills Eyes: No visual changes. ENT: No sore throat. Cardiovascular: Denies chest pain. Respiratory: Denies shortness of breath. Gastrointestinal: No abdominal pain.  No nausea, no vomiting.  No diarrhea.  No constipation. Genitourinary: Negative for dysuria. Musculoskeletal: Negative for back or neck pain.  Skin: Negative for rash. Neurological: Negative for headaches, focal weakness or numbness. ____________________________________________   PHYSICAL EXAM:  VITAL SIGNS: ED Triage Vitals  Enc Vitals Group     BP 06/11/20 1958 (!) 158/116     Pulse Rate 06/11/20 1958 (!) 102     Resp 06/11/20 1958 20     Temp 06/11/20 1958 97.8 F (36.6 C)     Temp Source 06/11/20 1958 Oral     SpO2 06/11/20 1958 96 %     Weight 06/11/20 1959 184 lb (83.5 kg)     Height --      Head Circumference --      Peak Flow --      Pain Score 06/11/20 1959 5     Pain Loc --      Pain Edu? --      Excl. in [place]? --     Constitutional: Alert and oriented. Well appearing and in no acute distress. Eyes: Conjunctivae are normal. PERRL. EOMI. Head: Atraumatic. Nose: No congestion/rhinnorhea. Mouth/Throat: Mucous membranes are moist.  Oropharynx non-erythematous. Neck: No stridor.   Hematological/Lymphatic/Immunilogical: No cervical  lymphadenopathy. Cardiovascular: Normal rate, regular rhythm. Grossly normal heart sounds.  Good peripheral circulation. Respiratory: Normal respiratory effort.  No retractions. Lungs CTAB. Gastrointestinal: Soft and nontender. No distention. No abdominal bruits. No CVA tenderness. Genitourinary:  Musculoskeletal: No lower extremity tenderness nor edema.  No joint effusions. Neurologic:  Normal speech and language. No gross focal neurologic deficits are appreciated. No gait instability. Skin:  Skin is warm, dry and intact. No rash noted. Psychiatric: Mood and affect are normal. Speech and behavior are normal.  ____________________________________________   LABS (all labs ordered are listed, but only abnormal results are displayed)  Labs Reviewed  BASIC METABOLIC PANEL - Abnormal; Notable for the following components:      Result Value   Glucose, Bld 139 (*)    BUN 26 (*)    GFR, Estimated 56 (*)    All other components  within normal limits  CBC WITH DIFFERENTIAL/PLATELET - Abnormal; Notable for the following components:   RBC 5.17 (*)    All other components within normal limits  RESP PANEL BY RT-PCR (FLU A&B, COVID) ARPGX2  PROTIME-INR  URINALYSIS, COMPLETE (UACMP) WITH MICROSCOPIC   ____________________________________________  EKG  *** ____________________________________________  RADIOLOGY  ED MD interpretation:    *** I, Cari Triplett, personally viewed and evaluated these images (plain radiographs) as part of my medical decision making, as well as reviewing the written report by the radiologist.  Official radiology report(s): CT Head Wo Contrast  Result Date: 06/11/2020 CLINICAL DATA:  Head trauma fall EXAM: CT HEAD WITHOUT CONTRAST TECHNIQUE: Contiguous axial images were obtained from the base of the skull through the vertex without intravenous contrast. COMPARISON:  MRI 03/26/2020, CT 03/26/2020 FINDINGS: Brain: no acute territorial infarction, hemorrhage or  intracranial mass. Mild atrophy. Moderate hypodensity in the white matter consistent with chronic small vessel ischemic change. Chronic right thalamic infarct. Stable ventricle size. Vascular: No hyperdense vessels.  Carotid vascular calcification. Skull: Normal. Negative for fracture or focal lesion. Sinuses/Orbits: No acute finding. Other: None IMPRESSION: 1. No CT evidence for acute intracranial abnormality. 2. Atrophy and chronic small vessel ischemic changes of the white matter. Chronic right thalamic infarct. Electronically Signed   By: Donavan Foil M.D.   On: 06/11/2020 23:10   CT Cervical Spine Wo Contrast  Result Date: 06/11/2020 CLINICAL DATA:  Fall EXAM: CT CERVICAL SPINE WITHOUT CONTRAST TECHNIQUE: Multidetector CT imaging of the cervical spine was performed without intravenous contrast. Multiplanar CT image reconstructions were also generated. COMPARISON:  CT 03/24/2020 FINDINGS: Alignment: Straightening of the cervical spine.  No subluxation Skull base and vertebrae: No fracture. Stable lucency in C7 vertebra with slight stippled appearance on axial images suggesting hemangioma. Soft tissues and spinal canal: No prevertebral fluid or swelling. No visible canal hematoma. Disc levels: Moderate to marked diffuse degenerative changes throughout the cervical spine with multiple level disc space narrowing and osteophyte. Mild facet degenerative changes at multiple levels. Upper chest: Negative. Other: None IMPRESSION: Straightening of the cervical spine with moderate to marked degenerative changes. No acute osseous abnormality. Electronically Signed   By: Donavan Foil M.D.   On: 06/11/2020 23:14   DG Toe Great Right  Result Date: 06/11/2020 CLINICAL DATA:  Right great toe pain, status post trauma. EXAM: RIGHT GREAT TOE COMPARISON:  October 03, 2015 FINDINGS: A small nondisplaced fracture is seen involving the tuft of the distal phalanx of the right great toe. There is no evidence of dislocation.  There is no evidence of arthropathy or other focal bone abnormality. Mild diffuse soft tissue swelling is noted. IMPRESSION: Nondisplaced fracture of the tuft of the distal phalanx of the right great toe. Electronically Signed   By: Virgina Norfolk M.D.   On: 06/11/2020 20:38    ____________________________________________   PROCEDURES  Procedure(s) performed (including Critical Care):  Procedures  ____________________________________________   INITIAL IMPRESSION / ASSESSMENT AND PLAN     ***  DIFFERENTIAL DIAGNOSIS  ***  ED COURSE  ***    ___________________________________________   FINAL CLINICAL IMPRESSION(S) / ED DIAGNOSES  Final diagnoses:  None     ED Discharge Orders    None       [place] P [place] was evaluated in Emergency Department on 06/11/2020 for the symptoms described in the history of present illness. She was evaluated in the context of the global COVID-19 pandemic, which necessitated consideration that the patient might be at risk  for infection with the SARS-CoV-2 virus that causes COVID-19. Institutional protocols and algorithms that pertain to the evaluation of patients at risk for COVID-19 are in a state of rapid change based on information released by regulatory bodies including the CDC and federal and state organizations. These policies and algorithms were followed during the patient's care in the ED.   Note:  This document was prepared using Dragon voice recognition software and may include unintentional dictation errors.

## 2020-06-11 NOTE — ED Notes (Signed)
Pt oriented to self, situation. Pt did think she was at [facility], and knows christmas is coming up but thought it was 2022  Pt very hard of hearing

## 2020-06-11 NOTE — ED Provider Notes (Signed)
[facility] Emergency Department Provider Note ____________________________________________   First MD Initiated Contact with Patient 06/11/20 2224     (approximate)  I have reviewed the triage vital signs and the nursing notes.   HISTORY  Chief Complaint Fall  HPI Jenny [name] is a 86 y.o. female with history of embolic stroke in September 2021, a fib, and other history as listed below presents to the emergency department for treatment and evaluation after fall today. Son states that over the past 2 weeks she has gone from ambulating with her walker to not ambulating without dragging the left leg and therefore unsteady even with walker. While going into the house after her eye appointment today, she stubbed her toe and slowly fell to the ground. Her son couldn't quite catch her. She did not hit the floor hard or hit her head. They had to get EMS to get her up. Family called Dr. Elzie Rings office and was advised to bring her to the ER immediately.     Past Medical History:  Diagnosis Date  . Depression   . Dyspnea   . Dysrhythmia   . GERD (gastroesophageal reflux disease)   . HBP (high blood pressure)   . Iron deficiency anemia due to chronic blood loss 04/23/2020  . Iron deficiency anemia due to chronic blood loss 04/23/2020  . Reflux   . Skin cancer   . Thyroid disease     Patient Active Problem List   Diagnosis Date Noted  . Iron deficiency anemia due to chronic blood loss 04/23/2020  . Embolic stroke ([place]) 31/54/0086  . Acute metabolic encephalopathy 76/19/5093  . Fall at home, initial encounter 03/24/2020  . AF (paroxysmal atrial fibrillation) ([place]) 03/24/2020  . Atrial fibrillation, chronic ([place]) 03/24/2020  . Hypothyroid 03/24/2020  . GERD (gastroesophageal reflux disease) 03/24/2020  . AMS (altered mental status) 03/24/2020  . Bradycardia 03/24/2020  . Severe epistaxis 03/24/2020  . Avitaminosis D 06/07/2015  . Back ache 11/22/2013   . Artery disease, cerebral 10/26/2013  . Diverticulitis of colon 10/26/2013  . Acid reflux 10/26/2013  . Calcium blood increased 10/26/2013  . HLD (hyperlipidemia) 10/26/2013    Past Surgical History:  Procedure Laterality Date  . BREAST BIOPSY Right   . CARDIOVERSION N/A 04/07/2018   Procedure: CARDIOVERSION;  Surgeon: Teodoro Spray, MD;  Location: [facility] ORS;  Service: Cardiovascular;  Laterality: N/A;  . ESOPHAGOGASTRODUODENOSCOPY (EGD) WITH PROPOFOL N/A 04/26/2015   Procedure: ESOPHAGOGASTRODUODENOSCOPY (EGD) WITH PROPOFOL;  Surgeon: Manya Silvas, MD;  Location: [facility] ENDOSCOPY;  Service: Endoscopy;  Laterality: N/A;  . KNEE ARTHROSCOPY WITH LATERAL MENISECTOMY Left 03/22/2019   Procedure: KNEE ARTHROSCOPY WITH PARTIAL, MEDIAL AND LATERAL MENISECTOMY;  Surgeon: Hessie Knows, MD;  Location: [facility] ORS;  Service: Orthopedics;  Laterality: Left;  . NECK SURGERY      Prior to Admission medications   Medication Sig Start Date End Date Taking? Authorizing Provider  acetaminophen (TYLENOL) 500 MG tablet Take 500 mg by mouth every 6 (six) hours as needed for mild pain, fever or headache.     [provider]  apixaban (ELIQUIS) 5 MG TABS tablet Take 1 tablet (5 mg total) by mouth 2 (two) times daily. 03/27/20   Teodoro Spray, MD  atorvastatin (LIPITOR) 10 MG tablet Take 10 mg by mouth daily. 01/19/20   [provider]  busPIRone (BUSPAR) 10 MG tablet Take by mouth. 04/03/20 04/03/21  [provider]  cholecalciferol (VITAMIN D3) 25 MCG (1000 UNIT) tablet Take 1,000  Units by mouth daily.    [provider]  DULoxetine (CYMBALTA) 60 MG capsule Take 120 mg by mouth daily.     [provider]  FEROSUL 325 (65 Fe) MG tablet TAKE 1 TABLET BY MOUTH 3 TIMES DAILY WITH MEALS 05/15/20   Earlie Server, MD  ferrous sulfate 325 (65 FE) MG EC tablet Take 1 tablet (325 mg total) by mouth 3 (three) times daily with meals. 04/09/20   Earlie Server, MD  levothyroxine  (SYNTHROID) 100 MCG tablet Take 100 mcg by mouth daily before breakfast.    [provider]  Multiple Vitamins-Minerals (MULTIVITAMIN PO) Take 1 tablet by mouth daily.     [provider]  pantoprazole (PROTONIX) 40 MG tablet Take 40 mg by mouth 2 (two) times daily.  05/07/13   [provider]    Allergies Flagyl [metronidazole]  No family history on file.  Social History Social History   Tobacco Use  . Smoking status: Never Smoker  . Smokeless tobacco: Never Used  Vaping Use  . Vaping Use: Never used  Substance Use Topics  . Alcohol use: No    Alcohol/week: 0.0 standard drinks  . Drug use: No    Review of Systems  Constitutional: No fever/chills Eyes: No visual changes. ENT: No sore throat. Cardiovascular: Denies chest pain. Respiratory: Denies shortness of breath. Gastrointestinal: No abdominal pain.  No nausea, no vomiting.  No diarrhea.  No constipation. Genitourinary: Negative for dysuria. Musculoskeletal: Negative for back or neck pain. Positive for right great toe pain. Skin: Negative for rash. Neurological: Negative for headaches, positive for weakness of the left lower extremity. ____________________________________________   PHYSICAL EXAM:  VITAL SIGNS: ED Triage Vitals  Enc Vitals Group     BP 06/11/20 1958 (!) 158/116     Pulse Rate 06/11/20 1958 (!) 102     Resp 06/11/20 1958 20     Temp 06/11/20 1958 97.8 F (36.6 C)     Temp Source 06/11/20 1958 Oral     SpO2 06/11/20 1958 96 %     Weight 06/11/20 1959 184 lb (83.5 kg)     Height --      Head Circumference --      Peak Flow --      Pain Score 06/11/20 1959 5     Pain Loc --      Pain Edu? --      Excl. in [place]? --     Constitutional: Alert and oriented x person and situation. Chronically ill appearing and in no acute distress. Eyes: Conjunctivae are normal. Head: Atraumatic. Nose: No congestion/rhinnorhea. Mouth/Throat: Mucous membranes are moist.   Neck: No  stridor.   Hematological/Lymphatic/Immunilogical: No cervical lymphadenopathy. Cardiovascular: Normal rate, regular rhythm. Grossly normal heart sounds.  Good peripheral circulation. Respiratory: Normal respiratory effort.  No retractions. Lungs CTAB. Gastrointestinal: Soft and nontender. No distention. No abdominal bruits. No CVA tenderness. Genitourinary:  Musculoskeletal: Tenderness and mild swelling of the right great toe. Neurologic:  Normal speech. Left lower extremity weakness.  Skin:  Skin is warm, dry and intact. No rash noted. Psychiatric: Mood and affect are normal. Speech and behavior are normal.  ____________________________________________   LABS (all labs ordered are listed, but only abnormal results are displayed)  Labs Reviewed  BASIC METABOLIC PANEL - Abnormal; Notable for the following components:      Result Value   Glucose, Bld 139 (*)    BUN 26 (*)    GFR, Estimated 56 (*)  All other components within normal limits  CBC WITH DIFFERENTIAL/PLATELET - Abnormal; Notable for the following components:   RBC 5.17 (*)    All other components within normal limits  RESP PANEL BY RT-PCR (FLU A&B, COVID) ARPGX2  PROTIME-INR  URINALYSIS, COMPLETE (UACMP) WITH MICROSCOPIC   ____________________________________________  EKG  Pending ____________________________________________  RADIOLOGY  ED MD interpretation:    CT head and cervical spine images negative for acute concerns.  I, Sherrie George, personally viewed and evaluated these images (plain radiographs) as part of my medical decision making, as well as reviewing the written report by the radiologist.  Official radiology report(s): CT Head Wo Contrast  Result Date: 06/11/2020 CLINICAL DATA:  Head trauma fall EXAM: CT HEAD WITHOUT CONTRAST TECHNIQUE: Contiguous axial images were obtained from the base of the skull through the vertex without intravenous contrast. COMPARISON:  MRI 03/26/2020, CT 03/26/2020  FINDINGS: Brain: no acute territorial infarction, hemorrhage or intracranial mass. Mild atrophy. Moderate hypodensity in the white matter consistent with chronic small vessel ischemic change. Chronic right thalamic infarct. Stable ventricle size. Vascular: No hyperdense vessels.  Carotid vascular calcification. Skull: Normal. Negative for fracture or focal lesion. Sinuses/Orbits: No acute finding. Other: None IMPRESSION: 1. No CT evidence for acute intracranial abnormality. 2. Atrophy and chronic small vessel ischemic changes of the white matter. Chronic right thalamic infarct. Electronically Signed   By: Donavan Foil M.D.   On: 06/11/2020 23:10   CT Cervical Spine Wo Contrast  Result Date: 06/11/2020 CLINICAL DATA:  Fall EXAM: CT CERVICAL SPINE WITHOUT CONTRAST TECHNIQUE: Multidetector CT imaging of the cervical spine was performed without intravenous contrast. Multiplanar CT image reconstructions were also generated. COMPARISON:  CT 03/24/2020 FINDINGS: Alignment: Straightening of the cervical spine.  No subluxation Skull base and vertebrae: No fracture. Stable lucency in C7 vertebra with slight stippled appearance on axial images suggesting hemangioma. Soft tissues and spinal canal: No prevertebral fluid or swelling. No visible canal hematoma. Disc levels: Moderate to marked diffuse degenerative changes throughout the cervical spine with multiple level disc space narrowing and osteophyte. Mild facet degenerative changes at multiple levels. Upper chest: Negative. Other: None IMPRESSION: Straightening of the cervical spine with moderate to marked degenerative changes. No acute osseous abnormality. Electronically Signed   By: Donavan Foil M.D.   On: 06/11/2020 23:14   DG Toe Great Right  Result Date: 06/11/2020 CLINICAL DATA:  Right great toe pain, status post trauma. EXAM: RIGHT GREAT TOE COMPARISON:  October 03, 2015 FINDINGS: A small nondisplaced fracture is seen involving the tuft of the distal phalanx  of the right great toe. There is no evidence of dislocation. There is no evidence of arthropathy or other focal bone abnormality. Mild diffuse soft tissue swelling is noted. IMPRESSION: Nondisplaced fracture of the tuft of the distal phalanx of the right great toe. Electronically Signed   By: Virgina Norfolk M.D.   On: 06/11/2020 20:38    ____________________________________________   PROCEDURES  Procedure(s) performed (including Critical Care):  Procedures  ____________________________________________   INITIAL IMPRESSION / ASSESSMENT AND PLAN     86 year old female presents to the emergency department for treatment and evaluation of increasing weakness and fall. See HPI for further details. Will get labs, urinalysis, ekg, ct head and cervical spine.   DIFFERENTIAL DIAGNOSIS  CVA, acute cystitis, progressive weakness  ED COURSE  Patient assisted into bed x 3 nurses. Son states that this has worsened over the past 2 weeks, but seems worse today.   CT head  and cervical spine results reassuring. Labs thus far are not concerning. COVID and influenza are negative. Urinalysis and EKG is pending.   Case discussed with Dr. Alfred Levins who will follow up on urine results, ekg, and discuss disposition with son.     ___________________________________________   FINAL CLINICAL IMPRESSION(S) / ED DIAGNOSES  Final diagnoses:  Nondisplaced fracture of distal phalanx of right great toe, initial encounter for closed fracture  Weakness     ED Discharge Orders    None       [place] P T[place] was evaluated in Emergency Department on 06/12/2020 for the symptoms described in the history of present illness. She was evaluated in the context of the global COVID-19 pandemic, which necessitated consideration that the patient might be at risk for infection with the SARS-CoV-2 virus that causes COVID-19. Institutional protocols and algorithms that pertain to the evaluation of patients at risk for  COVID-19 are in a state of rapid change based on information released by regulatory bodies including the CDC and federal and state organizations. These policies and algorithms were followed during the patient's care in the ED.   Note:  This document was prepared using Dragon voice recognition software and may include unintentional dictation errors.   Victorino Dike, FNP 06/12/20 [place], [place], MD 06/12/20 [phone]

## 2020-06-11 NOTE — ED Triage Notes (Signed)
Pt states she tripped and fell and now is co right great toe pain. No obvious deformity, or other complaints. Pt is very hard of hearing.

## 2020-06-11 NOTE — ED Triage Notes (Signed)
Pt comes into the ED via EMS from home states she tripped fell about 10hr prior , denies injury, has not c/o pain. Pt is a/ox4.. pt is on elaquis.

## 2020-06-11 NOTE — ED Notes (Signed)
Pt arrives from home after a fall at approx 1030 this AM. Pt reports she was going up some steps and lost her balance. Pt with injury to right great toe, residual weakness to left leg from prior stroke.   Pt son reports that she had a stroke in September with some residual deficits that had almost entirely resolved. He states that over the past 2-3 days, pt has been less oriented than normal, and gets lost in conversation. Pt son reports that family and friends have all noticed the behavioral change and so he brought her to be checked out.

## 2020-06-12 ENCOUNTER — Emergency Department: Payer: Medicare Other

## 2020-06-12 DIAGNOSIS — S92424A Nondisplaced fracture of distal phalanx of right great toe, initial encounter for closed fracture: Secondary | ICD-10-CM | POA: Diagnosis not present

## 2020-06-12 LAB — URINALYSIS, COMPLETE (UACMP) WITH MICROSCOPIC
Bacteria, UA: NONE SEEN
Bilirubin Urine: NEGATIVE
Glucose, UA: NEGATIVE mg/dL
Hgb urine dipstick: NEGATIVE
Ketones, ur: NEGATIVE mg/dL
Leukocytes,Ua: NEGATIVE
Nitrite: NEGATIVE
Protein, ur: NEGATIVE mg/dL
Specific Gravity, Urine: 1.024 (ref 1.005–1.030)
Squamous Epithelial / HPF: NONE SEEN (ref 0–5)
pH: 5 (ref 5.0–8.0)

## 2020-06-12 MED ORDER — ACETAMINOPHEN 500 MG PO TABS
1000.0000 mg | ORAL_TABLET | Freq: Once | ORAL | Status: AC
  Administered 2020-06-12: 1000 mg via ORAL
  Filled 2020-06-12: qty 2

## 2020-06-12 MED ORDER — ATORVASTATIN CALCIUM 20 MG PO TABS
10.0000 mg | ORAL_TABLET | Freq: Every day | ORAL | Status: DC
  Administered 2020-06-12 – 2020-06-15 (×4): 10 mg via ORAL
  Filled 2020-06-12 (×4): qty 1

## 2020-06-12 MED ORDER — ATENOLOL 25 MG PO TABS
25.0000 mg | ORAL_TABLET | Freq: Every day | ORAL | Status: DC
  Administered 2020-06-12 – 2020-06-15 (×4): 25 mg via ORAL
  Filled 2020-06-12 (×4): qty 1

## 2020-06-12 MED ORDER — LEVOTHYROXINE SODIUM 50 MCG PO TABS
100.0000 ug | ORAL_TABLET | Freq: Every day | ORAL | Status: DC
  Administered 2020-06-13 – 2020-06-15 (×3): 100 ug via ORAL
  Filled 2020-06-12 (×3): qty 2

## 2020-06-12 MED ORDER — ACETAMINOPHEN 500 MG PO TABS
500.0000 mg | ORAL_TABLET | Freq: Four times a day (QID) | ORAL | Status: DC | PRN
  Administered 2020-06-14: 500 mg via ORAL
  Filled 2020-06-12: qty 1

## 2020-06-12 MED ORDER — PANTOPRAZOLE SODIUM 40 MG PO TBEC
40.0000 mg | DELAYED_RELEASE_TABLET | Freq: Two times a day (BID) | ORAL | Status: DC
  Administered 2020-06-12 – 2020-06-15 (×7): 40 mg via ORAL
  Filled 2020-06-12 (×7): qty 1

## 2020-06-12 MED ORDER — VITAMIN D 25 MCG (1000 UNIT) PO TABS
1000.0000 [IU] | ORAL_TABLET | Freq: Every day | ORAL | Status: DC
  Administered 2020-06-12 – 2020-06-15 (×4): 1000 [IU] via ORAL
  Filled 2020-06-12 (×4): qty 1

## 2020-06-12 MED ORDER — APIXABAN 5 MG PO TABS
5.0000 mg | ORAL_TABLET | Freq: Two times a day (BID) | ORAL | Status: DC
  Administered 2020-06-12 – 2020-06-15 (×7): 5 mg via ORAL
  Filled 2020-06-12 (×7): qty 1

## 2020-06-12 MED ORDER — LEVOTHYROXINE SODIUM 50 MCG PO TABS: 100.0000 ug | ORAL_TABLET | Freq: Every day | ORAL | Status: DC

## 2020-06-12 NOTE — ED Provider Notes (Signed)
Emergency Medicine Observation Re-evaluation Note  Jenny [name] is a 86 y.o. female, seen on rounds today.  Pt initially presented to the ED for complaints of Fall Currently, the patient is awaiting placement .  Physical Exam  BP (!) 179/116 (BP Location: Left Arm)   Pulse 80   Temp 97.8 F (36.6 C) (Oral)   Resp 20   Wt 83.5 kg   SpO2 96%   BMI 28.82 kg/m  Physical Exam General: in NAD Cardiac: well perfused Lungs: even and unlabored Psych: calm  ED Course / MDM  EKG:    I have reviewed the labs performed to date as well as medications administered while in observation.  Recent changes in the last 24 hours include .  Plan  Current plan is for SW dispo.    Merlyn Lot, MD 06/12/20 [phone]

## 2020-06-12 NOTE — ED Notes (Signed)
Pt given toothbrush and toothpaste. Face cleaned with damp cloth. Pt tugging at [place], adjusted, reminded to leave in place

## 2020-06-12 NOTE — ED Provider Notes (Signed)
Accepted care of this patient from Vladimir Crofts, NP at 11:30 PM.  For further details please refer to her note in the chart. In summary this is a 86 year old female with a history of stroke in September 2021, A. fib, hypothyroidism who was brought in by her son after a fall today. According to the son, over the last 2 weeks patient has been having more difficulty walking and has been dragging her left leg. She has been unsteady even with a walker which led to the fall today. She was found to have a broken toe from the fall. She was signed out to me pending a UA and possible evaluation for an acute stroke as etiologies of her decline. Her UA was negative for infection and the MRI did not show any signs of an acute stroke. She does have very mild left-sided weakness which seems to have been present since her stroke in September. Review of her blood work does not show any significant dehydration, electrolyte derangements, or any signs of infection that could explain her decline. Therefore patient does not meet criteria for admission at this time. Will consult physical therapy and social worker for rehab placement. Discussed all the findings and plan with patient's son who is her healthcare power of attorney and he is in agreement.   I have personally reviewed the images performed during this visit and I agree with the Radiologist's read.   Interpretation by Radiologist:  CT Head Wo Contrast  Result Date: 06/11/2020 CLINICAL DATA:  Head trauma fall EXAM: CT HEAD WITHOUT CONTRAST TECHNIQUE: Contiguous axial images were obtained from the base of the skull through the vertex without intravenous contrast. COMPARISON:  MRI 03/26/2020, CT 03/26/2020 FINDINGS: Brain: no acute territorial infarction, hemorrhage or intracranial mass. Mild atrophy. Moderate hypodensity in the white matter consistent with chronic small vessel ischemic change. Chronic right thalamic infarct. Stable ventricle size. Vascular: No hyperdense  vessels.  Carotid vascular calcification. Skull: Normal. Negative for fracture or focal lesion. Sinuses/Orbits: No acute finding. Other: None IMPRESSION: 1. No CT evidence for acute intracranial abnormality. 2. Atrophy and chronic small vessel ischemic changes of the white matter. Chronic right thalamic infarct. Electronically Signed   By: Donavan Foil M.D.   On: 06/11/2020 23:10   CT Cervical Spine Wo Contrast  Result Date: 06/11/2020 CLINICAL DATA:  Fall EXAM: CT CERVICAL SPINE WITHOUT CONTRAST TECHNIQUE: Multidetector CT imaging of the cervical spine was performed without intravenous contrast. Multiplanar CT image reconstructions were also generated. COMPARISON:  CT 03/24/2020 FINDINGS: Alignment: Straightening of the cervical spine.  No subluxation Skull base and vertebrae: No fracture. Stable lucency in C7 vertebra with slight stippled appearance on axial images suggesting hemangioma. Soft tissues and spinal canal: No prevertebral fluid or swelling. No visible canal hematoma. Disc levels: Moderate to marked diffuse degenerative changes throughout the cervical spine with multiple level disc space narrowing and osteophyte. Mild facet degenerative changes at multiple levels. Upper chest: Negative. Other: None IMPRESSION: Straightening of the cervical spine with moderate to marked degenerative changes. No acute osseous abnormality. Electronically Signed   By: Donavan Foil M.D.   On: 06/11/2020 23:14   MR BRAIN WO CONTRAST  Result Date: 06/12/2020 CLINICAL DATA:  Initial evaluation for acute dizziness. EXAM: MRI HEAD WITHOUT CONTRAST TECHNIQUE: Multiplanar, multiecho pulse sequences of the brain and surrounding structures were obtained without intravenous contrast. COMPARISON:  Prior head CT from 06/11/2020. FINDINGS: Brain: Generalized age appropriate cerebral atrophy. Patchy and confluent T2/FLAIR hyperintensity within the periventricular deep white matter  both cerebral hemispheres as well as the pons,  most consistent with chronic small vessel ischemic disease, moderate in nature. Remote lacunar infarct present within the ventral right thalamus. No abnormal foci of restricted diffusion to suggest acute or subacute ischemia. Gray-white matter differentiation maintained. No encephalomalacia to suggest chronic cortical infarction elsewhere within the brain. No foci of susceptibility artifact to suggest acute or chronic intracranial hemorrhage. No mass lesion, midline shift or mass effect. Ventricles normal size without hydrocephalus. No extra-axial fluid collection. Pituitary gland suprasellar region within normal limits. Midline structures intact. Vascular: Major intracranial vascular flow voids are maintained. Skull and upper cervical spine: Craniocervical junction within normal limits. Bone marrow signal intensity normal. No scalp soft tissue abnormality. Sinuses/Orbits: Patient status post bilateral ocular lens replacement. Globes and orbital soft tissues demonstrate no acute finding. Paranasal sinuses are clear. Trace left mastoid effusion, of doubtful significance. Inner ear structures grossly normal. Other: None. IMPRESSION: 1. No acute intracranial abnormality. 2. Age-related cerebral atrophy with moderate chronic microvascular ischemic disease. 3. Remote lacunar infarct involving the ventral right thalamus. Electronically Signed   By: Jeannine Boga M.D.   On: 06/12/2020 03:31   DG Toe Great Right  Result Date: 06/11/2020 CLINICAL DATA:  Right great toe pain, status post trauma. EXAM: RIGHT GREAT TOE COMPARISON:  October 03, 2015 FINDINGS: A small nondisplaced fracture is seen involving the tuft of the distal phalanx of the right great toe. There is no evidence of dislocation. There is no evidence of arthropathy or other focal bone abnormality. Mild diffuse soft tissue swelling is noted. IMPRESSION: Nondisplaced fracture of the tuft of the distal phalanx of the right great toe. Electronically Signed    By: Virgina Norfolk M.D.   On: 06/11/2020 20:38      Jenny Re, MD 06/12/20 [phone]

## 2020-06-12 NOTE — ED Notes (Signed)
Pt set up to eat breakfast  

## 2020-06-12 NOTE — ED Notes (Signed)
Pt in MRI.

## 2020-06-12 NOTE — ED Notes (Signed)
Pt provided with meal tray, diet order placed per Tamala Julian, MD  Will update VS after pt finished with lunch

## 2020-06-12 NOTE — ED Notes (Signed)
Pt placed on purewick by NT's, hooked up to suction in hallway

## 2020-06-12 NOTE — ED Notes (Addendum)
Pharmacy called regarding med rec and home meds. Med rec complete and verified, EDP placing orders now, will admin after  Pt in bed, NAD, son at bedside

## 2020-06-12 NOTE — TOC Initial Note (Signed)
Transition of Care [facility]) - Initial/Assessment Note    Patient Details  Name: Jenny [name] MRN: [id number] Date of Birth: 1928-05-02  [facility]) CM/SW Contact:    Victorino Dike, RN Phone Number: 06/12/2020, 2:44 PM  Clinical Narrative:                  Met with patient and son today while in ER.  Patient pleasantly confused.  Patient and spouse live in [place] that is "around the corner" from son Nadara Mustard.  They have 24 hour paid caregivers in the [place].  Patient's spouse is bed bound and on hospice.  There is also a sister that helps when able.  Currently family is interested in short term rehab for their mother because she has become weaker.  She has had an increase in falls recently.   Expected Discharge Plan: Skilled Nursing Facility Barriers to Discharge: Other (comment) (PT consult)   Patient Goals and CMS Choice   CMS Medicare.gov Compare Post Acute Care list provided to:: Patient Represenative (must comment) (adult son) Choice offered to / list presented to : Adult Children  Expected Discharge Plan and Services Expected Discharge Plan: [place]   Discharge Planning Services: CM Consult Post Acute Care Choice: [place] Living arrangements for the past 2 months: Single Family Home                                      Prior Living Arrangements/Services Living arrangements for the past 2 months: Single Family Home Lives with:: Self, Spouse, Other (Comment) (24 hour paid caregivers.) Patient language and need for interpreter reviewed:: Yes Do you feel safe going back to the place where you live?: Yes      Need for Family Participation in Patient Care: Yes (Comment) Care giver support system in place?: Yes (comment) Current home services: Sitter, Other (comment) (24 hour paid caregivers) Criminal Activity/Legal Involvement Pertinent to Current Situation/Hospitalization: No - Comment as needed  Activities of Daily  Living      Permission Sought/Granted                  Emotional Assessment Appearance:: Appears stated age Attitude/Demeanor/Rapport: Gracious Affect (typically observed): Calm, Happy Orientation: : Oriented to Self, Oriented to Place Alcohol / Substance Use: Not Applicable Psych Involvement: No (comment)  Admission diagnosis:  fall ems Patient Active Problem List   Diagnosis Date Noted  . Iron deficiency anemia due to chronic blood loss 04/23/2020  . Embolic stroke ([place]) 37/04/6268  . Acute metabolic encephalopathy 48/54/6270  . Fall at home, initial encounter 03/24/2020  . AF (paroxysmal atrial fibrillation) ([place]) 03/24/2020  . Atrial fibrillation, chronic ([place]) 03/24/2020  . Hypothyroid 03/24/2020  . GERD (gastroesophageal reflux disease) 03/24/2020  . AMS (altered mental status) 03/24/2020  . Bradycardia 03/24/2020  . Severe epistaxis 03/24/2020  . Avitaminosis D 06/07/2015  . Back ache 11/22/2013  . Artery disease, cerebral 10/26/2013  . Diverticulitis of colon 10/26/2013  . Acid reflux 10/26/2013  . Calcium blood increased 10/26/2013  . HLD (hyperlipidemia) 10/26/2013   PCP:  Derinda Late, MD Pharmacy:   [facility], [place] [address] Phone: [phone] Fax: [phone]  [place], [place] [address] Phone: [phone] Fax: [phone]     Social Determinants  of Health (SDOH) Interventions    Readmission Risk Interventions No flowsheet data found.  

## 2020-06-12 NOTE — ED Notes (Signed)
Rounded on pt. Pt denies need for additional food/drink or repositioning. Pt recognizes this RN from last night, but does not remember the context. Pt oriented to self, disoriented to situation/time/place. Mild confusion regarding location/time, does not have any idea what the context of her visit is not. Pt informed she is awaiting SNF placement, pt remains convinced she will be going home today.   Pt denies further needs, son at bedside.

## 2020-06-12 NOTE — ED Notes (Signed)
Pt now resting with eyes closed; RR even and unlabored on RA with symmetrical rise and fall of chest.  purewick remains in place with portable suction (showing clear yellow urine in cannister).  Daughter at bedside with pt while pt awaits room assignment.  Will monitor for acute changes and maintain plan of care.

## 2020-06-12 NOTE — ED Notes (Signed)
Report given to Anna, RN 

## 2020-06-13 DIAGNOSIS — S92424A Nondisplaced fracture of distal phalanx of right great toe, initial encounter for closed fracture: Secondary | ICD-10-CM | POA: Diagnosis not present

## 2020-06-13 NOTE — ED Notes (Signed)
RN completed hourly check and introduction of self to the patient and self.  Pt confused at this time and does not recognize she is at the hospital.  Pt able to help update patient and orient her.  Pt had pulled out her Pure-wick.  Underpad changed, new sheets provided, peri-care performed, new brief placed on the patient and new pure-wick placed. Pt tolerated well with moderate assistance and family continues to stay at bedside.

## 2020-06-13 NOTE — NC FL2 (Addendum)
[place] LEVEL OF CARE SCREENING TOOL     IDENTIFICATION  Patient Name: Jenny [name] Birthdate: September 02, 1927 Sex: female Admission Date (Current Location): 06/11/2020  [place] and [place] Number:  Engineering geologist and Address:  [facility], [address] [place], [place] [address]      Provider Number: [id number]  Attending Physician Name and Address:  No att. providers found  Relative Name and Phone Number:  Ellen Goris   [phone]    Current Level of Care: Hospital Recommended Level of Care: [place] Prior Approval Number:    Date Approved/Denied:   PASRR Number: [id number]  Discharge Plan: SNF    Current Diagnoses: Patient Active Problem List   Diagnosis Date Noted  . Iron deficiency anemia due to chronic blood loss 04/23/2020  . Embolic stroke ([place]) 35/70/1779  . Acute metabolic encephalopathy 39/09/90  . Fall at home, initial encounter 03/24/2020  . AF (paroxysmal atrial fibrillation) ([place]) 03/24/2020  . Atrial fibrillation, chronic ([place]) 03/24/2020  . Hypothyroid 03/24/2020  . GERD (gastroesophageal reflux disease) 03/24/2020  . AMS (altered mental status) 03/24/2020  . Bradycardia 03/24/2020  . Severe epistaxis 03/24/2020  . Avitaminosis D 06/07/2015  . Back ache 11/22/2013  . Artery disease, cerebral 10/26/2013  . Diverticulitis of colon 10/26/2013  . Acid reflux 10/26/2013  . Calcium blood increased 10/26/2013  . HLD (hyperlipidemia) 10/26/2013    Orientation RESPIRATION BLADDER Height & Weight     Self, Situation, Place  Normal Incontinent Weight: 83.5 kg Height:  5\' 7"  (170.2 cm)  BEHAVIORAL SYMPTOMS/MOOD NEUROLOGICAL BOWEL NUTRITION STATUS      Continent Diet (Regular)  AMBULATORY STATUS COMMUNICATION OF NEEDS Skin     Verbally Normal                       Personal Care Assistance Level of Assistance  Bathing, Feeding, Dressing Bathing Assistance: Maximum  assistance Feeding assistance: Maximum assistance Dressing Assistance: Maximum assistance     Functional Limitations Info  Sight, Speech, Hearing Sight Info: Adequate Hearing Info: Impaired (wears hearing aids) Speech Info: Adequate    SPECIAL CARE FACTORS FREQUENCY  PT (By licensed PT), OT (By licensed OT)     PT Frequency: 5x a week OT Frequency: 5x a week            Contractures Contractures Info: Not present    Additional Factors Info  Code Status, Allergies Code Status Info: full Allergies Info: flagyl           Current Medications (06/13/2020):  This is the current hospital active medication list Current Facility-Administered Medications  Medication Dose Route Frequency Provider Last Rate Last Admin  . acetaminophen (TYLENOL) tablet 500 mg  500 mg Oral Q6H PRN Vladimir Crofts, MD      . apixaban Arne [place]) tablet 5 mg  5 mg Oral BID Vladimir Crofts, MD   5 mg at 06/13/20 1047  . atenolol (TENORMIN) tablet 25 mg  25 mg Oral Daily Vladimir Crofts, MD   25 mg at 06/13/20 1047  . atorvastatin (LIPITOR) tablet 10 mg  10 mg Oral Daily Vladimir Crofts, MD   10 mg at 06/13/20 1047  . cholecalciferol (VITAMIN D3) tablet 1,000 Units  1,000 Units Oral Daily Vladimir Crofts, MD   1,000 Units at 06/13/20 1047  . levothyroxine (SYNTHROID) tablet 100 mcg  100 mcg Oral QAC breakfast Vladimir Crofts, MD   100 mcg at 06/13/20 0519  . pantoprazole (PROTONIX) EC  tablet 40 mg  40 mg Oral BID Vladimir Crofts, MD   40 mg at 06/13/20 1048   Current Outpatient Medications  Medication Sig Dispense Refill  . acetaminophen (TYLENOL) 500 MG tablet Take 500 mg by mouth every 6 (six) hours as needed for mild pain, fever or headache.     Marland Kitchen apixaban (ELIQUIS) 5 MG TABS tablet Take 1 tablet (5 mg total) by mouth 2 (two) times daily. 60 tablet 3  . atenolol (TENORMIN) 25 MG tablet Take 25 mg by mouth daily.    Marland Kitchen atorvastatin (LIPITOR) 10 MG tablet Take 10 mg by mouth daily.    . busPIRone (BUSPAR) 10 MG tablet Take 10  mg by mouth.     . cholecalciferol (VITAMIN D3) 25 MCG (1000 UNIT) tablet Take 1,000 Units by mouth daily.    Marland Kitchen doxycycline (VIBRAMYCIN) 100 MG capsule 10 mg.    . DULoxetine (CYMBALTA) 60 MG capsule Take 120 mg by mouth daily.     . FEROSUL 325 (65 Fe) MG tablet TAKE 1 TABLET BY MOUTH 3 TIMES DAILY WITH MEALS 90 tablet 0  . levothyroxine (SYNTHROID) 100 MCG tablet Take 100 mcg by mouth daily before breakfast.    . Multiple Vitamins-Minerals (MULTIVITAMIN PO) Take 1 tablet by mouth daily.     . pantoprazole (PROTONIX) 40 MG tablet Take 40 mg by mouth 2 (two) times daily.     . ferrous sulfate 325 (65 FE) MG EC tablet Take 1 tablet (325 mg total) by mouth 3 (three) times daily with meals. 60 tablet 1     Discharge Medications: Please see discharge summary for a list of discharge medications.  Relevant Imaging Results:  Relevant Lab Results:   Additional Information [id number]  Victorino Dike, RN

## 2020-06-13 NOTE — ED Notes (Signed)
Pt easily aroused for vital sign re-assessment; hypertensive with vital sign re-assessment otherwise no acute changes.

## 2020-06-13 NOTE — TOC Progression Note (Signed)
Transition of Care [facility]) - Progression Note    Patient Details  Name: Jenny [name] MRN: [id number] Date of Birth: Mar 13, 1928  Transition of Care [facility]) CM/SW [place], RN Phone Number: 06/13/2020, 4:12 PM  Clinical Narrative:    Updated family on bed offers.  Widened search to include [place], [employer], [place], [place].  Daughter that is currently at bedside is Jenny [name] .  [phone].   Expected Discharge Plan: Skilled Nursing Facility Barriers to Discharge: Other (comment) (PT consult)  Expected Discharge Plan and Services Expected Discharge Plan: [place]   Discharge Planning Services: CM Consult Post Acute Care Choice: [place] Living arrangements for the past 2 months: Single Family Home                                       Social Determinants of Health (SDOH) Interventions    Readmission Risk Interventions No flowsheet data found.

## 2020-06-13 NOTE — ED Notes (Signed)
PT at bedside.

## 2020-06-13 NOTE — ED Notes (Signed)
Pt given a hospital bed and resting quietly in hall. Pure wick in place and patient has no complaints at this time

## 2020-06-13 NOTE — ED Notes (Signed)
VOLUNTARY/pending placement 

## 2020-06-13 NOTE — ED Notes (Signed)
Case manager at bedside to update on plan of care

## 2020-06-13 NOTE — ED Notes (Signed)
Daughter left to pick up items for patient at home; bed alarm now set - will increase rounding frequency

## 2020-06-13 NOTE — Progress Notes (Signed)
PT Cancellation Note  Patient Details Name: NARIYA NEUMEYER MRN: [id number] DOB: [birth year]   Cancelled Treatment:    Reason Eval/Treat Not Completed: Medical issues which prohibited therapy (BP outside of safe range for PT evaluation at this time. I have let RN know. Will attempt evaluation again at later date/time once appropriate.)   Kelcee Bjorn C 06/13/2020, 10:42 AM

## 2020-06-13 NOTE — Evaluation (Signed)
Physical Therapy Evaluation Patient Details Name: Jenny [name] MRN: [id number] DOB: December 06, 1927 Today's Date: 06/13/2020   History of Present Illness  Jenny [name] is a 23yoF who comes to [facility] on 12/6 s/p trip/fall, subsequent Rt hallux insult. Son reports progressive decline in gait quality over 2 weeks with dragging of left leg. PMH: HOH, remote CVA c left hemiplegia. At baseline pt AMB c RW, lives with bed bound husband who is followed by hospice. Pt has a 24/hr paid caregiver in home.  Clinical Impression  Pt admitted with above diagnosis. Pt currently with functional limitations due to the deficits listed below (see "PT Problem List"). Upon entry, pt in bed, easily made awake and agreeable to participate. DTR at bedside provides history. Pt does not have hearing aides, is VERY HOH. The pt is alert and oriented to self, pleasant, interactive, but is largely with flat affect, has severe delay in motor planning, difficulty attending to task. During mobility, pt has decreased spacial awareness and delayed/impaired righting responses and capacity for postural correction. MaxA required for bed mobility, mod-maxA for transfers. modA for AMB to facilitate initiation. Standing postural awareness improves with time on feet. Functional mobility assessment demonstrates increased effort/time requirements, poor tolerance, and need for physical assistance, whereas the patient performed these at a higher level of independence PTA. Pt is not at her cognitive baseline currently per DTR. Pt will benefit from skilled PT intervention to increase independence and safety with basic mobility in preparation for discharge to the venue listed below.       Follow Up Recommendations Supervision for mobility/OOB;SNF    Equipment Recommendations  None recommended by PT    Recommendations for Other Services       Precautions / Restrictions Precautions Precautions: Fall Precaution Comments: HOH      Mobility   Bed Mobility Overal bed mobility: Needs Assistance Bed Mobility: Supine to Sit;Sit to Supine     Supine to sit: Max assist Sit to supine: Max assist   General bed mobility comments: decreased inititation, impaired attention to task, weakness    Transfers Overall transfer level: Needs assistance Equipment used: Rolling walker (2 wheeled) Transfers: Sit to/from [facility] Sit to Stand: Max assist Stand pivot transfers: Max assist       General transfer comment: heavy posterior lean/pusher; does not improve much with multimodal cues  Ambulation/Gait Ambulation/Gait assistance: Mod assist Gait Distance (Feet): 18 Feet Assistive device: Rolling walker (2 wheeled) Gait Pattern/deviations: Step-to pattern     General Gait Details: processing difficulty, delayed inittiation, freezing; Pryor Curia has to manage RW for turning  Stairs            Wheelchair Mobility    Modified Rankin (Stroke Patients Only)       Balance Overall balance assessment: Needs assistance Sitting-balance support: Bilateral upper extremity supported;Feet supported Sitting balance-Leahy Scale: Good Sitting balance - Comments: delayed postural awareness, bradykinesia and freezing limited righting capacity   Standing balance support: Bilateral upper extremity supported;During functional activity Standing balance-Leahy Scale: Poor Standing balance comment: delayed postural awareness, bradykinesia and freezing limited righting capacity                             Pertinent Vitals/Pain Pain Assessment: No/denies pain    Home Living Family/patient expects to be discharged to:: Private residence Living Arrangements: Spouse/significant other (husband bed-boudn followed by hospice at home.) Available Help at Discharge: Family;Available 24 hours/day;Personal care attendant (24/7 caregiver in home  for husband, helps a bit c patient too. Son checks in daily.) Type of Home:  House Home Access: Stairs to enter Entrance Stairs-Rails: Right Entrance Stairs-Number of Steps: 3 Home Layout: One level Home Equipment: Walker - 2 wheels;Bedside commode;Grab bars - toilet;Grab bars - tub/shower      Prior Function Level of Independence: Needs assistance   Gait / Transfers Assistance Needed: Household AMB distances c RW adn supervison since CVA in October  ADL's / Homemaking Assistance Needed: Sits on [facility] for tub shower; requires assist for bathing, dressing, toiletting since CVA in October. Pt self feeds.  Comments: 1 fall since October for this admission; some falls in the preceeding 10 months.     Hand Dominance   Dominant Hand: Right    Extremity/Trunk Assessment   Upper Extremity Assessment Upper Extremity Assessment: Generalized weakness    Lower Extremity Assessment Lower Extremity Assessment: Generalized weakness       Communication   Communication: No difficulties;HOH  Cognition   Behavior During Therapy: Flat affect Overall Cognitive Status: Impaired/Different from baseline Area of Impairment: Problem solving;Attention;Memory                     Memory: Decreased short-term memory       Problem Solving: Slow processing;Decreased initiation;Requires verbal cues;Requires tactile cues        General Comments      Exercises     Assessment/Plan    PT Assessment Patient needs continued PT services  PT Problem List Decreased strength;Decreased cognition;Decreased balance;Decreased mobility;Decreased safety awareness;Decreased knowledge of precautions       PT Treatment Interventions Balance training;DME instruction;Gait training;Stair training;Functional mobility training;Therapeutic activities;Therapeutic exercise;Patient/family education;Neuromuscular re-education;Cognitive remediation    PT Goals (Current goals can be found in the Care Plan section)  Acute Rehab PT Goals Patient Stated Goal: regain indpendence with  household mobility PT Goal Formulation: With family Time For Goal Achievement: 06/27/20 Potential to Achieve Goals: Fair    Frequency Min 2X/week   Barriers to discharge Decreased caregiver support      Co-evaluation               AM-PAC PT "6 Clicks" Mobility  Outcome Measure Help needed turning from your back to your side while in a flat bed without using bedrails?: A Lot Help needed moving from lying on your back to sitting on the side of a flat bed without using bedrails?: A Lot Help needed moving to and from a bed to a chair (including a wheelchair)?: Total Help needed standing up from a chair using your arms (e.g., wheelchair or bedside chair)?: Total Help needed to walk in hospital room?: A Lot Help needed climbing 3-5 steps with a railing? : A Lot 6 Click Score: 10    End of Session Equipment Utilized During Treatment: Gait belt Activity Tolerance: Patient tolerated treatment well;No increased pain Patient left: in bed;with call bell/phone within reach Nurse Communication: Precautions PT Visit Diagnosis: Difficulty in walking, not elsewhere classified (R26.2);Other abnormalities of gait and mobility (R26.89);Other symptoms and signs involving the nervous system (R29.898)    Time: 0938-1829 PT Time Calculation (min) (ACUTE ONLY): 42 min   Charges:   PT Evaluation $PT Eval Moderate Complexity: 1 Mod PT Treatments $Neuromuscular Re-education: 8-22 mins       12:57 PM, 06/13/20 Etta Grandchild, PT, DPT Physical Therapist - [facility]  [phone] ([place])     Lehi Phifer C 06/13/2020, 12:51 PM

## 2020-06-13 NOTE — ED Notes (Signed)
Nutritional services called and informed the patient did not receive her breakfast tray.  Nutritional services to send hot one now.

## 2020-06-13 NOTE — Progress Notes (Addendum)
[facility] Liaison note:  This patient is currently followed by out patient based Palliative care with [facility].  [place] notified. Hospital Liaison  will follow for disposition.  Flo Shanks BSN, RN, [place] [phone]

## 2020-06-14 DIAGNOSIS — S92424A Nondisplaced fracture of distal phalanx of right great toe, initial encounter for closed fracture: Secondary | ICD-10-CM | POA: Diagnosis not present

## 2020-06-14 LAB — RESP PANEL BY RT-PCR (FLU A&B, COVID) ARPGX2
Influenza A by PCR: NEGATIVE
Influenza B by PCR: NEGATIVE
SARS Coronavirus 2 by RT PCR: NEGATIVE

## 2020-06-14 NOTE — Evaluation (Signed)
Occupational Therapy Evaluation Patient Details Name: Jenny [name] MRN: [id number] DOB: 15-Sep-1927 Today's Date: 06/14/2020    History of Present Illness Jenny [name] is a 20yoF who comes to [facility] on 12/6 s/p trip/fall, subsequent Rt hallux insult. Son reports progressive decline in gait quality over 2 weeks with dragging of left leg. PMH: HOH, remote CVA c left hemiplegia. At baseline pt AMB c RW, lives with bed bound husband who is followed by hospice. Pt has a 24/hr paid caregiver in home.   Clinical Impression   Patient presenting with decreased I in self care, balance, functional mobility/transfers, endurance, and safety awareness. Patient's daughter present in room to verbalize home set up secondary to pt being very HOH and lethargic this session. Pt living at home with bed bound husband who is on hospice and has 24/7 care assist ( they help her when needed as well) PTA. Pt has been mod I - supervision level with use of AD at home. Patient currently functioning at max A - total A for self care tasks. Pt having difficulty with sequencing, motor planning, and initiation this session. Max A to get to EOB and max A to stand with therapist. Therapist assisting with weight shift and physically picking up pt's L LE for side stepping. Pt fatigues very quickly. Total A to return to bed.  Patient will benefit from acute OT to increase overall independence in the areas of ADLs, functional mobility, and safety awareness in order to safely discharge to next venue of care.    Follow Up Recommendations  SNF;Supervision/Assistance - 24 hour    Equipment Recommendations  Other (comment) (defer to next venue of care)    Recommendations for Other Services Other (comment) (none at this time)     Precautions / Restrictions Precautions Precautions: Fall Precaution Comments: HOH      Mobility Bed Mobility Overal bed mobility: Needs Assistance Bed Mobility: Supine to Sit;Sit to Supine     Supine  to sit: Max assist Sit to supine: Max assist   General bed mobility comments: decreased inititation, impaired attention to task, weakness    Transfers Overall transfer level: Needs assistance Equipment used: 1 person hand held assist Transfers: Sit to/from Stand Sit to Stand: Max assist              Balance Overall balance assessment: Needs assistance Sitting-balance support: Bilateral upper extremity supported;Feet supported Sitting balance-Leahy Scale: Good Sitting balance - Comments: delayed postural awareness, bradykinesia and freezing limited righting capacity   Standing balance support: Bilateral upper extremity supported;During functional activity Standing balance-Leahy Scale: Poor Standing balance comment: delayed postural awareness, bradykinesia and freezing limited righting capacity            ADL either performed or assessed with clinical judgement   ADL Overall ADL's : Needs assistance/impaired     Grooming: Wash/dry hands;Wash/dry face;Oral care;Applying deodorant;Minimal assistance;Sitting                Vision Baseline Vision/History: Wears glasses Wears Glasses: At all times Patient Visual Report: No change from baseline Additional Comments: Pt unable to follow commands and sustain attention to perform formal assessment. Pt noted to have R gaze preference and tracks to L with max cuing. Pt did appear to have nystagmus when looking to L.            Pertinent Vitals/Pain Pain Assessment: Faces Faces Pain Scale: No hurt     Hand Dominance Right   Extremity/Trunk Assessment Upper Extremity Assessment Upper Extremity Assessment:  Generalized weakness   Lower Extremity Assessment Lower Extremity Assessment: Generalized weakness   Cervical / Trunk Assessment Cervical / Trunk Assessment: Kyphotic   Communication Communication Communication: No difficulties;HOH   Cognition Arousal/Alertness: Lethargic Behavior During Therapy: Flat  affect Overall Cognitive Status: Impaired/Different from baseline Area of Impairment: Problem solving;Attention;Memory;Safety/judgement;Orientation                 Orientation Level: Disoriented to;Situation   Memory: Decreased short-term memory Following Commands: Follows one step commands with increased time Safety/Judgement: Decreased awareness of safety;Decreased awareness of deficits   Problem Solving: Slow processing;Decreased initiation;Requires verbal cues;Requires tactile cues                Home Living Family/patient expects to be discharged to:: Private residence Living Arrangements: Spouse/significant other Available Help at Discharge: Family;Available 24 hours/day;Personal care attendant Type of Home: House Home Access: Stairs to enter [employer] of Steps: 3 Entrance Stairs-Rails: Right Home Layout: One level     Bathroom Shower/Tub: Tub/shower unit     Bathroom Accessibility: Yes   Home Equipment: Environmental consultant - 2 wheels;Bedside commode;Grab bars - toilet;Grab bars - tub/shower   Additional Comments: Pt lives with bed bound husband (on hospice) who has 24/7 care attendant that help her when needed as well.      Prior Functioning/Environment Level of Independence: Needs assistance  Gait / Transfers Assistance Needed: Household AMB distances c RW adn supervison since CVA in October ADL's / Homemaking Assistance Needed: Sits on [facility] for tub shower; requires assist for bathing, dressing, toileting since CVA in October. Pt self feeds.   Comments: 1 fall since October for this admission; some falls in the preceeding 10 months.        OT Problem List: Decreased strength;Impaired vision/perception;Decreased range of motion;Decreased coordination;Decreased activity tolerance;Decreased cognition;Cardiopulmonary status limiting activity;Pain;Impaired sensation;Decreased safety awareness;Impaired balance (sitting and/or standing)      OT  Treatment/Interventions: Self-care/ADL training;Therapeutic exercise;Energy conservation;Cognitive remediation/compensation;Therapeutic activities;DME and/or AE instruction;Patient/family education;Balance training    OT Goals(Current goals can be found in the care plan section) Acute Rehab OT Goals Patient Stated Goal: to get better OT Goal Formulation: With patient/family Time For Goal Achievement: 06/28/20 Potential to Achieve Goals: Good ADL Goals Pt Will Perform Grooming: sitting;with supervision Pt Will Transfer to Toilet: with min assist;bedside commode;ambulating Pt Will Perform Toileting - Clothing Manipulation and hygiene: with min assist;sit to/from stand  OT Frequency: Min 2X/week   Barriers to D/C:    none known at this time          AM-PAC OT "6 Clicks" Daily Activity     Outcome Measure Help from another person eating meals?: A Lot Help from another person taking care of personal grooming?: A Lot Help from another person toileting, which includes using toliet, bedpan, or urinal?: Total Help from another person bathing (including washing, rinsing, drying)?: A Lot Help from another person to put on and taking off regular upper body clothing?: A Lot Help from another person to put on and taking off regular lower body clothing?: Total 6 Click Score: 10   End of Session Equipment Utilized During Treatment: Rolling walker Nurse Communication: Mobility status  Activity Tolerance: Patient limited by fatigue Patient left: in bed;with call bell/phone within reach;with family/visitor present;with bed alarm set  OT Visit Diagnosis: Unsteadiness on feet (R26.81);Muscle weakness (generalized) (M62.81)                Time: 6269-4854 OT Time Calculation (min): 26 min Charges:  OT General Charges $OT  Visit: 1 Visit OT Evaluation $OT Eval Low Complexity: 1 Low OT Treatments $Self Care/Home Management : 8-22 mins  Darleen Crocker, MS, OTR/L , CBIS ascom [phone]   06/14/20, 12:42 PM

## 2020-06-14 NOTE — ED Notes (Signed)
Assisted pt to the bathroom with ED tech [facility] assistance

## 2020-06-14 NOTE — ED Notes (Signed)
Pt daughter is at the bedside, encouraging pt to eat a little something. Pt is alert at present.

## 2020-06-14 NOTE — ED Notes (Signed)
Assisted pt to the bathroom

## 2020-06-14 NOTE — ED Notes (Signed)
Pt calm , collective , daughter at bedside

## 2020-06-14 NOTE — TOC Progression Note (Signed)
Transition of Care [facility]) - Progression Note    Patient Details  Name: Jenny [name] MRN: [id number] Date of Birth: [birth year]  Transition [facility] Care [facility]) CM/SW [place], RN Phone Number: 06/14/2020, 10:07 AM  Clinical Narrative:     Spoke with family and patient, offered choice of bed offers.  Family discussed and returned my call.  They have accepted a bed offer at [facility].  Spoke with [employer].  The are filing for 3 day waiver and will have a bed available tomorrow for Mrs. Shreiner.    Expected Discharge Plan: [place] Barriers to Discharge: No SNF bed  Expected Discharge Plan and Services Expected Discharge Plan: [place]   Discharge Planning Services: CM Consult Post Acute Care Choice: [place] Living arrangements for the past 2 months: Single Family Home                                       Social Determinants of Health (SDOH) Interventions    Readmission Risk Interventions No flowsheet data found.

## 2020-06-15 DIAGNOSIS — S92424A Nondisplaced fracture of distal phalanx of right great toe, initial encounter for closed fracture: Secondary | ICD-10-CM | POA: Diagnosis not present

## 2020-06-15 LAB — CBC
HCT: 46.2 % — ABNORMAL HIGH (ref 36.0–46.0)
Hemoglobin: 14.8 g/dL (ref 12.0–15.0)
MCH: 27.8 pg (ref 26.0–34.0)
MCHC: 32 g/dL (ref 30.0–36.0)
MCV: 86.7 fL (ref 80.0–100.0)
Platelets: 271 10*3/uL (ref 150–400)
RBC: 5.33 MIL/uL — ABNORMAL HIGH (ref 3.87–5.11)
RDW: 15.4 % (ref 11.5–15.5)
WBC: 9.8 10*3/uL (ref 4.0–10.5)
nRBC: 0 % (ref 0.0–0.2)

## 2020-06-15 NOTE — ED Provider Notes (Signed)
-----------------------------------------   5:35 AM on 06/15/2020 -----------------------------------------   Blood pressure (!) 173/74, pulse 63, temperature 98 F (36.7 C), temperature source Oral, resp. rate 16, height 5\' 7"  (1.702 m), weight 83.5 kg, SpO2 94 %.  The patient is resting at this time.  There have been no acute events since the last update.  Hopefully to [facility] SNF today.   Paulette Blanch, MD 06/15/20 [phone]

## 2020-06-15 NOTE — ED Notes (Signed)
[facility]  CALLED  FOR  TRANSPORT  TO  [facility] 

## 2020-06-15 NOTE — TOC Transition Note (Addendum)
Transition of Care [facility]) - CM/SW Discharge Note   Patient Details  Name: Jenny [name] MRN: [id number] Date of Birth: 07-19-1927  Transition of Care [facility]) CM/SW Contact:  Ova Freshwater Phone Number: [phone] 06/15/2020, 11:23 AM   Clinical Narrative:     Patient will d/c to [facility] room 126, report [phone].  The SNF will need the AVS available as soon as possible. Her room will be ready after 2:00PM. EDP/ ED Staff notified. Haigh,Howard (Son) [phone] notified. TOC consult complete.  Final next level of care: Skilled Nursing Facility Barriers to Discharge: No SNF bed   Patient Goals and CMS Choice   CMS Medicare.gov Compare Post Acute Care list provided to:: Patient Represenative (must comment) (adult son) Choice offered to / list presented to : Adult Children  Discharge Placement              Patient chooses bed at: [facility]        Discharge Plan and Services   Discharge Planning Services: CM Consult Post Acute Care Choice: [place]                               Social Determinants of Health (SDOH) Interventions     Readmission Risk Interventions No flowsheet data found.

## 2020-06-15 NOTE — TOC Progression Note (Addendum)
Transition of Care [facility]) - Progression Note    Patient Details  Name: Jenny [name] MRN: [id number] Date of Birth: Mar 18, 1928  Transition of Care [facility]) CM/SW [place], [place] Phone Number:  [phone] 06/15/2020, 9:16 AM  Clinical Narrative:     Patient will d/c to [facility] SNF.  CSW updated EDP/ED Staff.  CSW is waiting for room and report number from SNF.    Expected Discharge Plan: [place] Barriers to Discharge: No SNF bed  Expected Discharge Plan and Services Expected Discharge Plan: [place]   Discharge Planning Services: CM Consult Post Acute Care Choice: [place] Living arrangements for the past 2 months: Single Family Home                                       Social Determinants of Health (SDOH) Interventions    Readmission Risk Interventions No flowsheet data found.

## 2020-06-15 NOTE — ED Notes (Signed)
Pt sleeping pt calm , collective, daughter sleeping at bedside

## 2020-06-15 NOTE — ED Notes (Signed)
Pt assisted to bedside toilet with heavy 2 person assist. Large BM at this time.

## 2020-06-21 ENCOUNTER — Emergency Department: Payer: Medicare Other

## 2020-06-21 ENCOUNTER — Other Ambulatory Visit: Payer: Self-pay

## 2020-06-21 ENCOUNTER — Encounter: Payer: Self-pay | Admitting: Nurse Practitioner

## 2020-06-21 ENCOUNTER — Encounter: Payer: Self-pay | Admitting: Internal Medicine

## 2020-06-21 ENCOUNTER — Encounter: Payer: Self-pay | Admitting: *Deleted

## 2020-06-21 ENCOUNTER — Inpatient Hospital Stay
Admission: EM | Admit: 2020-06-21 | Discharge: 2020-06-25 | DRG: 071 | Disposition: A | Discharge status: Hospice | Payer: Medicare Other | Attending: Internal Medicine | Admitting: Internal Medicine

## 2020-06-21 ENCOUNTER — Other Ambulatory Visit: Payer: Medicare Other | Admitting: Nurse Practitioner

## 2020-06-21 DIAGNOSIS — G928 Other toxic encephalopathy: Secondary | ICD-10-CM | POA: Diagnosis not present

## 2020-06-21 DIAGNOSIS — Z7901 Long term (current) use of anticoagulants: Secondary | ICD-10-CM

## 2020-06-21 DIAGNOSIS — R4182 Altered mental status, unspecified: Secondary | ICD-10-CM

## 2020-06-21 DIAGNOSIS — Z20822 Contact with and (suspected) exposure to covid-19: Secondary | ICD-10-CM | POA: Diagnosis present

## 2020-06-21 DIAGNOSIS — G9341 Metabolic encephalopathy: Secondary | ICD-10-CM | POA: Diagnosis present

## 2020-06-21 DIAGNOSIS — Z66 Do not resuscitate: Secondary | ICD-10-CM | POA: Diagnosis present

## 2020-06-21 DIAGNOSIS — I10 Essential (primary) hypertension: Secondary | ICD-10-CM | POA: Diagnosis present

## 2020-06-21 DIAGNOSIS — R4701 Aphasia: Secondary | ICD-10-CM | POA: Diagnosis present

## 2020-06-21 DIAGNOSIS — Z85828 Personal history of other malignant neoplasm of skin: Secondary | ICD-10-CM

## 2020-06-21 DIAGNOSIS — R059 Cough, unspecified: Secondary | ICD-10-CM

## 2020-06-21 DIAGNOSIS — F32A Depression, unspecified: Secondary | ICD-10-CM | POA: Diagnosis present

## 2020-06-21 DIAGNOSIS — I6349 Cerebral infarction due to embolism of other cerebral artery: Secondary | ICD-10-CM

## 2020-06-21 DIAGNOSIS — E785 Hyperlipidemia, unspecified: Secondary | ICD-10-CM | POA: Diagnosis present

## 2020-06-21 DIAGNOSIS — Z79899 Other long term (current) drug therapy: Secondary | ICD-10-CM

## 2020-06-21 DIAGNOSIS — E039 Hypothyroidism, unspecified: Secondary | ICD-10-CM

## 2020-06-21 DIAGNOSIS — I480 Paroxysmal atrial fibrillation: Secondary | ICD-10-CM | POA: Diagnosis present

## 2020-06-21 DIAGNOSIS — Z79890 Hormone replacement therapy (postmenopausal): Secondary | ICD-10-CM

## 2020-06-21 DIAGNOSIS — I69354 Hemiplegia and hemiparesis following cerebral infarction affecting left non-dominant side: Secondary | ICD-10-CM

## 2020-06-21 DIAGNOSIS — E871 Hypo-osmolality and hyponatremia: Secondary | ICD-10-CM | POA: Diagnosis present

## 2020-06-21 DIAGNOSIS — R400 Somnolence: Secondary | ICD-10-CM | POA: Diagnosis not present

## 2020-06-21 DIAGNOSIS — R29714 NIHSS score 14: Secondary | ICD-10-CM | POA: Diagnosis present

## 2020-06-21 DIAGNOSIS — I482 Chronic atrial fibrillation, unspecified: Secondary | ICD-10-CM

## 2020-06-21 DIAGNOSIS — R778 Other specified abnormalities of plasma proteins: Secondary | ICD-10-CM

## 2020-06-21 DIAGNOSIS — K219 Gastro-esophageal reflux disease without esophagitis: Secondary | ICD-10-CM | POA: Diagnosis present

## 2020-06-21 DIAGNOSIS — Z515 Encounter for palliative care: Secondary | ICD-10-CM

## 2020-06-21 LAB — CBC WITH DIFFERENTIAL/PLATELET
Abs Immature Granulocytes: 0.04 10*3/uL (ref 0.00–0.07)
Basophils Absolute: 0 10*3/uL (ref 0.0–0.1)
Basophils Relative: 0 %
Eosinophils Absolute: 0 10*3/uL (ref 0.0–0.5)
Eosinophils Relative: 0 %
HCT: 46.4 % — ABNORMAL HIGH (ref 36.0–46.0)
Hemoglobin: 14.9 g/dL (ref 12.0–15.0)
Immature Granulocytes: 0 %
Lymphocytes Relative: 14 %
Lymphs Abs: 1.5 10*3/uL (ref 0.7–4.0)
MCH: 27.4 pg (ref 26.0–34.0)
MCHC: 32.1 g/dL (ref 30.0–36.0)
MCV: 85.3 fL (ref 80.0–100.0)
Monocytes Absolute: 0.6 10*3/uL (ref 0.1–1.0)
Monocytes Relative: 5 %
Neutro Abs: 9.2 10*3/uL — ABNORMAL HIGH (ref 1.7–7.7)
Neutrophils Relative %: 81 %
Platelets: 297 10*3/uL (ref 150–400)
RBC: 5.44 MIL/uL — ABNORMAL HIGH (ref 3.87–5.11)
RDW: 15.3 % (ref 11.5–15.5)
WBC: 11.4 10*3/uL — ABNORMAL HIGH (ref 4.0–10.5)
nRBC: 0 % (ref 0.0–0.2)

## 2020-06-21 LAB — URINALYSIS, COMPLETE (UACMP) WITH MICROSCOPIC
Bacteria, UA: NONE SEEN
Bilirubin Urine: NEGATIVE
Glucose, UA: 50 mg/dL — AB
Hgb urine dipstick: NEGATIVE
Ketones, ur: 5 mg/dL — AB
Leukocytes,Ua: NEGATIVE
Nitrite: NEGATIVE
Protein, ur: 100 mg/dL — AB
Specific Gravity, Urine: >1.046 — ABNORMAL HIGH (ref 1.005–1.030)
pH: 7 (ref 5.0–8.0)

## 2020-06-21 LAB — URINE DRUG SCREEN, QUALITATIVE (ARMC ONLY)
Amphetamines, Ur Screen: NOT DETECTED
Barbiturates, Ur Screen: NOT DETECTED
Benzodiazepine, Ur Scrn: NOT DETECTED
Cannabinoid 50 Ng, Ur ~~LOC~~: NOT DETECTED
Cocaine Metabolite,Ur ~~LOC~~: NOT DETECTED
MDMA (Ecstasy)Ur Screen: NOT DETECTED
Methadone Scn, Ur: NOT DETECTED
Opiate, Ur Screen: NOT DETECTED
Phencyclidine (PCP) Ur S: NOT DETECTED
Tricyclic, Ur Screen: NOT DETECTED

## 2020-06-21 LAB — CBC
HCT: 46.1 % — ABNORMAL HIGH (ref 36.0–46.0)
Hemoglobin: 15 g/dL (ref 12.0–15.0)
MCH: 27.5 pg (ref 26.0–34.0)
MCHC: 32.5 g/dL (ref 30.0–36.0)
MCV: 84.6 fL (ref 80.0–100.0)
Platelets: 295 10*3/uL (ref 150–400)
RBC: 5.45 MIL/uL — ABNORMAL HIGH (ref 3.87–5.11)
RDW: 15 % (ref 11.5–15.5)
WBC: 11.6 10*3/uL — ABNORMAL HIGH (ref 4.0–10.5)
nRBC: 0 % (ref 0.0–0.2)

## 2020-06-21 LAB — COMPREHENSIVE METABOLIC PANEL
ALT: 22 U/L (ref 0–44)
AST: 27 U/L (ref 15–41)
Albumin: 4.5 g/dL (ref 3.5–5.0)
Alkaline Phosphatase: 29 U/L — ABNORMAL LOW (ref 38–126)
Anion gap: 13 (ref 5–15)
BUN: 17 mg/dL (ref 8–23)
CO2: 25 mmol/L (ref 22–32)
Calcium: 9.6 mg/dL (ref 8.9–10.3)
Chloride: 93 mmol/L — ABNORMAL LOW (ref 98–111)
Creatinine, Ser: 0.73 mg/dL (ref 0.44–1.00)
GFR, Estimated: >60 mL/min (ref >60)
Glucose, Bld: 161 mg/dL — ABNORMAL HIGH (ref 70–99)
Potassium: 4.2 mmol/L (ref 3.5–5.1)
Sodium: 131 mmol/L — ABNORMAL LOW (ref 135–145)
Total Bilirubin: 0.8 mg/dL (ref 0.3–1.2)
Total Protein: 8.3 g/dL — ABNORMAL HIGH (ref 6.5–8.1)

## 2020-06-21 LAB — TROPONIN I (HIGH SENSITIVITY)
Troponin I (High Sensitivity): 21 ng/L — ABNORMAL HIGH (ref <18)
Troponin I (High Sensitivity): 23 ng/L — ABNORMAL HIGH (ref <18)

## 2020-06-21 LAB — PROTIME-INR
INR: 1.1 (ref 0.8–1.2)
Prothrombin Time: 14.1 seconds (ref 11.4–15.2)

## 2020-06-21 LAB — APTT: aPTT: 36 seconds (ref 24–36)

## 2020-06-21 LAB — RESP PANEL BY RT-PCR (FLU A&B, COVID) ARPGX2
Influenza A by PCR: NEGATIVE
Influenza B by PCR: NEGATIVE
SARS Coronavirus 2 by RT PCR: NEGATIVE

## 2020-06-21 LAB — ETHANOL: Alcohol, Ethyl (B): <10 mg/dL (ref <10)

## 2020-06-21 LAB — CBG MONITORING, ED: Glucose-Capillary: 175 mg/dL — ABNORMAL HIGH (ref 70–99)

## 2020-06-21 LAB — MAGNESIUM: Magnesium: 1.9 mg/dL (ref 1.7–2.4)

## 2020-06-21 MED ORDER — LEVETIRACETAM IN NACL 1000 MG/100ML IV SOLN
1000.0000 mg | INTRAVENOUS | Status: AC
  Administered 2020-06-21: 18:00:00 1000 mg via INTRAVENOUS
  Filled 2020-06-21: qty 100

## 2020-06-21 MED ORDER — ACETAMINOPHEN 500 MG PO TABS: 500.0000 mg | ORAL_TABLET | Freq: Four times a day (QID) | ORAL | Status: DC | PRN

## 2020-06-21 MED ORDER — IOHEXOL 350 MG/ML SOLN
100.0000 mL | Freq: Once | INTRAVENOUS | Status: AC | PRN
  Administered 2020-06-21: 100 mL via INTRAVENOUS

## 2020-06-21 MED ORDER — ADULT MULTIVITAMIN W/MINERALS CH
ORAL_TABLET | Freq: Every day | ORAL | Status: DC
  Administered 2020-06-23 – 2020-06-25 (×3): 1 via ORAL
  Filled 2020-06-21 (×3): qty 1

## 2020-06-21 MED ORDER — DOXYCYCLINE HYCLATE 100 MG PO TABS: 100.0000 mg | ORAL_TABLET | Freq: Two times a day (BID) | ORAL | Status: DC

## 2020-06-21 MED ORDER — LABETALOL HCL 5 MG/ML IV SOLN: 10.0000 mg | Freq: Four times a day (QID) | INTRAVENOUS | Status: DC | PRN

## 2020-06-21 MED ORDER — ATORVASTATIN CALCIUM 10 MG PO TABS
10.0000 mg | ORAL_TABLET | Freq: Every day | ORAL | Status: DC
  Administered 2020-06-23 – 2020-06-25 (×3): 10 mg via ORAL
  Filled 2020-06-21 (×3): qty 1

## 2020-06-21 MED ORDER — STROKE: EARLY STAGES OF RECOVERY BOOK: Freq: Once | Status: DC

## 2020-06-21 MED ORDER — BUSPIRONE HCL 10 MG PO TABS
10.0000 mg | ORAL_TABLET | Freq: Every day | ORAL | Status: DC
  Administered 2020-06-23 – 2020-06-25 (×3): 10 mg via ORAL
  Filled 2020-06-21 (×3): qty 1

## 2020-06-21 MED ORDER — APIXABAN 5 MG PO TABS
5.0000 mg | ORAL_TABLET | Freq: Two times a day (BID) | ORAL | Status: DC
  Administered 2020-06-23 – 2020-06-25 (×5): 5 mg via ORAL
  Filled 2020-06-21 (×6): qty 1

## 2020-06-21 MED ORDER — ACETAMINOPHEN 325 MG PO TABS
650.0000 mg | ORAL_TABLET | ORAL | Status: DC | PRN
  Administered 2020-06-23: 650 mg via ORAL
  Filled 2020-06-21: qty 2

## 2020-06-21 MED ORDER — SENNOSIDES-DOCUSATE SODIUM 8.6-50 MG PO TABS: 1.0000 | ORAL_TABLET | Freq: Every evening | ORAL | Status: DC | PRN

## 2020-06-21 MED ORDER — DULOXETINE HCL 60 MG PO CPEP
120.0000 mg | ORAL_CAPSULE | Freq: Every day | ORAL | Status: DC
  Filled 2020-06-21: qty 2

## 2020-06-21 MED ORDER — LACTATED RINGERS IV BOLUS
500.0000 mL | Freq: Once | INTRAVENOUS | Status: AC
  Administered 2020-06-21: 18:00:00 500 mL via INTRAVENOUS

## 2020-06-21 MED ORDER — LEVETIRACETAM IN NACL 500 MG/100ML IV SOLN
500.0000 mg | Freq: Two times a day (BID) | INTRAVENOUS | Status: DC
  Administered 2020-06-22 – 2020-06-25 (×7): 500 mg via INTRAVENOUS
  Filled 2020-06-21 (×8): qty 100

## 2020-06-21 MED ORDER — PANTOPRAZOLE SODIUM 40 MG PO TBEC
40.0000 mg | DELAYED_RELEASE_TABLET | Freq: Two times a day (BID) | ORAL | Status: DC
  Administered 2020-06-23 – 2020-06-25 (×5): 40 mg via ORAL
  Filled 2020-06-21 (×6): qty 1

## 2020-06-21 MED ORDER — ACETAMINOPHEN 650 MG RE SUPP: 650.0000 mg | RECTAL | Status: DC | PRN

## 2020-06-21 MED ORDER — LEVOTHYROXINE SODIUM 50 MCG PO TABS: 100.0000 ug | ORAL_TABLET | Freq: Every day | ORAL | Status: DC

## 2020-06-21 MED ORDER — ACETAMINOPHEN 160 MG/5ML PO SOLN
650.0000 mg | ORAL | Status: DC | PRN
  Filled 2020-06-21: qty 20.3

## 2020-06-21 MED ORDER — ONDANSETRON HCL 4 MG/2ML IJ SOLN
4.0000 mg | Freq: Once | INTRAMUSCULAR | Status: AC
  Administered 2020-06-21: 18:00:00 4 mg via INTRAVENOUS
  Filled 2020-06-21: qty 2

## 2020-06-21 MED ORDER — VITAMIN D 25 MCG (1000 UNIT) PO TABS
1000.0000 [IU] | ORAL_TABLET | Freq: Every day | ORAL | Status: DC
  Administered 2020-06-23 – 2020-06-25 (×3): 1000 [IU] via ORAL
  Filled 2020-06-21 (×3): qty 1

## 2020-06-21 NOTE — ED Notes (Signed)
Pt's bp WNL. Pt continues to be occasionally responsive, saying "what" when asked a question or "ouch" when blood is drawn

## 2020-06-21 NOTE — ED Provider Notes (Signed)
[facility] Emergency Department Provider Note  ____________________________________________   Event Date/Time   First MD Initiated Contact with Patient 06/21/20 1554     (approximate)  I have reviewed the triage vital signs and the nursing notes.   HISTORY  Chief Complaint Altered Mental Status   HPI Jenny [name] is a 86 y.o. female with a past medical history of anemia, HTN, GERD, depression, HDL, A. 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  Per daughter who is at bedside and aide noticed she was unable to speak around 1 AM.  She was last seen normal around 10 PM last night.  She is typically able to carry a conversation and while not ambulatory is able to move her upper extremities and right lower extremity on command.  Since this event she has not been following any directions and only has been able to mumble some unintelligible words.  She has not had any recent injuries or falls.  Per daughter no other recent sick symptoms including cough diarrhea although she thinks she vomited earlier today.  No blood in the vomit.  No other clear alleviating aggravating precipitating events.  Patient is unable to write any history and arrival secondary to altered mental status.          Past Medical History:  Diagnosis Date  . Depression   . Dyspnea   . Dysrhythmia   . GERD (gastroesophageal reflux disease)   . HBP (high blood pressure)   . Iron deficiency anemia due to chronic blood loss 04/23/2020  . Iron deficiency anemia due to chronic blood loss 04/23/2020  . Reflux   . Skin cancer   . Thyroid disease     Patient Active Problem List   Diagnosis Date Noted  . Iron deficiency anemia due to chronic blood loss 04/23/2020  . Embolic stroke ([place]) 96/29/5284   . Acute metabolic encephalopathy 13/24/4010  . Fall at home, initial encounter 03/24/2020  . AF (paroxysmal atrial fibrillation) ([place]) 03/24/2020  . Atrial fibrillation, chronic ([place]) 03/24/2020  . Hypothyroid 03/24/2020  . GERD (gastroesophageal reflux disease) 03/24/2020  . AMS (altered mental status) 03/24/2020  . Bradycardia 03/24/2020  . Severe epistaxis 03/24/2020  . Avitaminosis D 06/07/2015  . Back ache 11/22/2013  . Artery disease, cerebral 10/26/2013  . Diverticulitis of colon 10/26/2013  . Acid reflux 10/26/2013  . Calcium blood increased 10/26/2013  . HLD (hyperlipidemia) 10/26/2013    Past Surgical History:  Procedure Laterality Date  . BREAST BIOPSY Right   . CARDIOVERSION N/A 04/07/2018   Procedure: CARDIOVERSION;  Surgeon: Teodoro Spray, MD;  Location: [facility] ORS;  Service: Cardiovascular;  Laterality: N/A;  . ESOPHAGOGASTRODUODENOSCOPY (EGD) WITH PROPOFOL N/A 04/26/2015   Procedure: ESOPHAGOGASTRODUODENOSCOPY (EGD) WITH PROPOFOL;  Surgeon: Manya Silvas, MD;  Location: [facility] ENDOSCOPY;  Service: Endoscopy;  Laterality: N/A;  . KNEE ARTHROSCOPY WITH LATERAL MENISECTOMY Left 03/22/2019   Procedure: KNEE ARTHROSCOPY WITH PARTIAL, MEDIAL AND LATERAL MENISECTOMY;  Surgeon: Hessie Knows, MD;  Location: [facility] ORS;  Service: Orthopedics;  Laterality: Left;  . NECK SURGERY      Prior to Admission medications   Medication Sig Start Date End Date Taking? Authorizing Provider  acetaminophen (TYLENOL) 500 MG tablet Take 500 mg by mouth every 6 (six) hours as needed for mild pain, fever  or headache.     [provider]  apixaban (ELIQUIS) 5 MG TABS tablet Take 1 tablet (5 mg total) by mouth 2 (two) times daily. 03/27/20   Teodoro Spray, MD  atenolol (TENORMIN) 25 MG tablet Take 25 mg by mouth daily. 05/02/20   [provider]  atorvastatin (LIPITOR) 10 MG tablet Take 10 mg by mouth daily. 01/19/20   [provider]  busPIRone (BUSPAR) 10 MG tablet  Take 10 mg by mouth.  04/03/20 04/03/21  [provider]  cholecalciferol (VITAMIN D3) 25 MCG (1000 UNIT) tablet Take 1,000 Units by mouth daily.    [provider]  doxycycline (VIBRAMYCIN) 100 MG capsule 10 mg. 06/11/20   [provider]  DULoxetine (CYMBALTA) 60 MG capsule Take 120 mg by mouth daily.     [provider]  levothyroxine (SYNTHROID) 100 MCG tablet Take 100 mcg by mouth daily before breakfast.    [provider]  Multiple Vitamins-Minerals (MULTIVITAMIN PO) Take 1 tablet by mouth daily.     [provider]  pantoprazole (PROTONIX) 40 MG tablet Take 40 mg by mouth 2 (two) times daily.  05/07/13   [provider]  FEROSUL 325 (65 Fe) MG tablet TAKE 1 TABLET BY MOUTH 3 TIMES DAILY WITH MEALS 05/15/20 06/15/20  Earlie Server, MD    Allergies Flagyl [metronidazole]  No family history on file.  Social History Social History   Tobacco Use  . Smoking status: Never Smoker  . Smokeless tobacco: Never Used  Vaping Use  . Vaping Use: Never used  Substance Use Topics  . Alcohol use: No    Alcohol/week: 0.0 standard drinks  . Drug use: No    Review of Systems  Review of Systems  Unable to perform ROS: Mental status change      ____________________________________________   PHYSICAL EXAM:  VITAL SIGNS: ED Triage Vitals  Enc Vitals Group     BP 06/21/20 1222 (!) 162/107     Pulse Rate 06/21/20 1222 (!) 102     Resp 06/21/20 1222 15     Temp 06/21/20 1222 98.7 F (37.1 C)     Temp Source 06/21/20 1222 Oral     SpO2 06/21/20 1222 100 %     Weight --      Height --      Head Circumference --      Peak Flow --      Pain Score 06/21/20 1210 0     Pain Loc --      Pain Edu? --      Excl. in [place]? --    Vitals:   06/21/20 1222 06/21/20 1549  BP: (!) 162/107 (!) 156/124  Pulse: (!) 102 (!) 106  Resp: 15 16  Temp: 98.7 F (37.1 C) 98.5 F (36.9 C)  SpO2: 100% 100%   Physical Exam Vitals and nursing note  reviewed.  Constitutional:      General: She is not in acute distress.    Appearance: She is well-developed and well-nourished.  HENT:     Head: Normocephalic and atraumatic.     Right Ear: External ear normal.  Eyes:     Conjunctiva/sclera: Conjunctivae normal.  Cardiovascular:     Rate and Rhythm: Normal rate and regular rhythm.     Heart sounds: No murmur heard.   Pulmonary:     Effort: Pulmonary effort is normal. No respiratory distress.     Breath sounds: Normal breath sounds.  Abdominal:  Palpations: Abdomen is soft.     Tenderness: There is no abdominal tenderness.  Musculoskeletal:        General: No edema.     Cervical back: Neck supple.  Skin:    General: Skin is warm and dry.  Neurological:     Mental Status: She is alert.  Psychiatric:        Mood and Affect: Mood and affect normal.     Patient does not speak when questioned.  PERRLA.  EOMI.  He does not otherwise participate in cranial nerve exam.  She does not follow any commands for pronator drift or finger dysmetria testing.  She withdraws her lower extremities to noxious stimuli but does not follow commands in her lower extremities. ____________________________________________   LABS (all labs ordered are listed, but only abnormal results are displayed)  Labs Reviewed  COMPREHENSIVE METABOLIC PANEL - Abnormal; Notable for the following components:      Result Value   Sodium 131 (*)    Chloride 93 (*)    Glucose, Bld 161 (*)    Total Protein 8.3 (*)    Alkaline Phosphatase 29 (*)    All other components within normal limits  CBC - Abnormal; Notable for the following components:   WBC 11.6 (*)    RBC 5.45 (*)    HCT 46.1 (*)    All other components within normal limits  CBC WITH DIFFERENTIAL/PLATELET - Abnormal; Notable for the following components:   WBC 11.4 (*)    RBC 5.44 (*)    HCT 46.4 (*)    Neutro Abs 9.2 (*)    All other components within normal limits  CBG MONITORING, ED - Abnormal;  Notable for the following components:   Glucose-Capillary 175 (*)    All other components within normal limits  TROPONIN I (HIGH SENSITIVITY) - Abnormal; Notable for the following components:   Troponin I (High Sensitivity) 21 (*)    All other components within normal limits  RESP PANEL BY RT-PCR (FLU A&B, COVID) ARPGX2  MAGNESIUM  PROTIME-INR  APTT  ETHANOL  URINALYSIS, COMPLETE (UACMP) WITH MICROSCOPIC  URINE DRUG SCREEN, QUALITATIVE ([facility] ONLY)  I-STAT CREATININE, ED  TROPONIN I (HIGH SENSITIVITY)   ____________________________________________  EKG  A. fib with a rate of 102, left axis deviation, unremarkable intervals, nonspecific ST changes in inferior lateral leads. ____________________________________________  RADIOLOGY  ED MD interpretation: No evidence on CT head Noncon of acute intracranial hemorrhage or other acute process  Official radiology report(s): CT Code Stroke CTA Head W/WO contrast  Result Date: 06/21/2020 CLINICAL DATA:  Stroke follow-up. EXAM: CT ANGIOGRAPHY HEAD AND NECK TECHNIQUE: Multidetector CT imaging of the head and neck was performed using the standard protocol during bolus administration of intravenous contrast. Multiplanar CT image reconstructions and MIPs were obtained to evaluate the vascular anatomy. Carotid stenosis measurements (when applicable) are obtained utilizing NASCET criteria, using the distal internal carotid diameter as the denominator. CONTRAST:  154mL OMNIPAQUE IOHEXOL 350 MG/ML SOLN COMPARISON:  Same day head CT. FINDINGS: CTA NECK FINDINGS Aortic arch: Great vessel origins are patent. Calcific atherosclerosis. Right carotid system: No evidence of dissection, stenosis (50% or greater) or occlusion. Mild atherosclerosis at the bifurcation. Left carotid system: No evidence of dissection, stenosis (50% or greater) or occlusion. Mild atherosclerosis at the bifurcation. Vertebral arteries: Codominant. No evidence of dissection, stenosis (50%  or greater) or occlusion. Mild multifocal narrowing of the right vertebral artery, likely related to atherosclerosis. Skeleton: No acute fracture. Other neck: No mass  or suspicious adenopathy. Upper chest: Negative. Review of the MIP images confirms the above findings CTA HEAD FINDINGS Anterior circulation: No significant stenosis, proximal occlusion, aneurysm, or vascular malformation. 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 Moderate to severe stenosis of the right P2 PCA with distal opacification. 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 Mild left P2 PCA stenosis. Venous sinuses: Poorly visualized given arterial timing. Anatomic variants: Hypoplastic left P1 PCA with fetal type left PCA. Review of the MIP images confirms the above findings IMPRESSION: 1. No convincing large vessel occlusion. 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 2. Moderate to severe stenosis of the right P2 PCA and moderate stenosis of the basilar artery. 3. No evidence of hemodynamically significant stenosis in the neck. Findings discussed with Dr. Rory Percy  at 4:50 p.m. via telephone. Electronically Signed   By: Margaretha Sheffield MD   On: 06/21/2020 17:01   CT Code Stroke CTA Neck W/WO contrast  Result Date: 06/21/2020 CLINICAL DATA:  Stroke follow-up. EXAM: CT ANGIOGRAPHY HEAD AND NECK TECHNIQUE: Multidetector CT imaging of the head and neck was performed using the standard protocol during bolus administration of intravenous contrast. Multiplanar CT image reconstructions and MIPs were obtained to evaluate the vascular anatomy. Carotid stenosis measurements (when applicable) are obtained utilizing NASCET criteria, using the distal internal carotid diameter as the denominator. CONTRAST:  152mL OMNIPAQUE IOHEXOL 350 MG/ML SOLN COMPARISON:  Same day head CT. FINDINGS: CTA NECK FINDINGS Aortic arch: Great vessel origins are  patent. Calcific atherosclerosis. Right carotid system: No evidence of dissection, stenosis (50% or greater) or occlusion. Mild atherosclerosis at the bifurcation. Left carotid system: No evidence of dissection, stenosis (50% or greater) or occlusion. Mild atherosclerosis at the bifurcation. Vertebral arteries: Codominant. No evidence of dissection, stenosis (50% or greater) or occlusion. Mild multifocal narrowing of the right vertebral artery, likely related to atherosclerosis. Skeleton: No acute fracture. Other neck: No mass or suspicious adenopathy. Upper chest: Negative. Review of the MIP images confirms the above findings CTA HEAD FINDINGS Anterior circulation: No significant stenosis, proximal occlusion, aneurysm, or vascular malformation. 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 Moderate to severe stenosis of the right P2 PCA with distal opacification. 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 Mild left P2 PCA stenosis. Venous sinuses: Poorly visualized given arterial timing. Anatomic variants: Hypoplastic left P1 PCA with fetal type left PCA. Review of the MIP images confirms the above findings IMPRESSION: 1. No convincing large vessel occlusion. 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 2. Moderate to severe stenosis of the right P2 PCA and moderate stenosis of the basilar artery. 3. No evidence of hemodynamically significant stenosis in the neck. Findings discussed with Dr. Rory Percy  at 4:50 p.m. via telephone. Electronically Signed   By: Margaretha Sheffield MD   On: 06/21/2020 17:01   CT Code Stroke Cerebral Perfusion with contrast  Result Date: 06/21/2020 CLINICAL DATA:  Stroke follow-up. EXAM: CT PERFUSION BRAIN TECHNIQUE: Multiphase CT imaging of the brain was performed following IV bolus contrast injection. Subsequent parametric perfusion maps were calculated using RAPID software.  CONTRAST:  143mL OMNIPAQUE IOHEXOL 350 MG/ML SOLN COMPARISON:  Same day CT imaging FINDINGS: CT Brain Perfusion Findings: CBF (<30%) Volume: 67mL Perfusion (Tmax>6.0s) volume: 81mL Mismatch Volume: 69mL ASPECTS on noncontrast CT  Head: 10. Infarct Core: 0 mL Areas of reported perfusion abnormality in bilateral frontal lobes are favored to reflect artifact given they do not correlate with a vascular territory and given artifact in this region. IMPRESSION: No convincing evidence of perfusion abnormality. Small areas of reported perfusion abnormality are favored to reflect artifact given they do not correlate with a vascular territory and given artifact in this region. Findings discussed with Dr. Rory Percy  at 4:50 p.m. via telephone. Electronically Signed   By: Margaretha Sheffield MD   On: 06/21/2020 17:04   CT HEAD CODE STROKE WO CONTRAST  Result Date: 06/21/2020 CLINICAL DATA:  Code stroke. Neuro deficit, acute stroke suspected. Altered mental status. EXAM: CT HEAD WITHOUT CONTRAST TECHNIQUE: Contiguous axial images were obtained from the base of the skull through the vertex without intravenous contrast. COMPARISON:  CT head June 11, 2020.  MRI June 12, 2020. FINDINGS: Brain: No evidence of acute large vascular territory infarction, hemorrhage, hydrocephalus, extra-axial collection or mass lesion/mass effect. Remote infarct involving the ventral right thalamus. Moderate patchy white matter hypoattenuation, most likely related to chronic microvascular ischemic disease. Vascular: Calcific atherosclerosis. No hyperdense vessel identified. Skull: No acute fracture. Sinuses/Orbits: Visualized sinuses are clear.  Unremarkable orbits. Other: No mastoid effusions. ASPECTS [facility] Stroke Program Early CT Score) Total score (0-10 with 10 being normal): 10. IMPRESSION: 1. No evidence of acute intracranial abnormality.  ASPECTS is 10. 2. Remote right thalamic infarct and microvascular ischemic disease. Code stroke imaging  results were communicated on 06/21/2020 at 4:27 pm to provider Dr. Rory Percy via secure text paging. Electronically Signed   By: Margaretha Sheffield MD   On: 06/21/2020 16:28    ____________________________________________   PROCEDURES  Procedure(s) performed (including Critical Care):  .1-3 Lead EKG Interpretation Performed by: Lucrezia Starch, MD Authorized by: Lucrezia Starch, MD     Interpretation: abnormal     ECG rate assessment: tachycardic     Rhythm: atrial fibrillation     Ectopy: none     Conduction: normal       ____________________________________________   INITIAL IMPRESSION / ASSESSMENT AND PLAN / ED COURSE      Patient presents with a stated history and exam for assessment of acute altered mental status.  On arrival she is hypertensive with a BP of 136/124 and tachycardic with a heart rate of 106 otherwise stable vital signs on room air.  Exam as above remarkable patient that does not follow any commands or speak when spoken to.  She is moving her upper extremity spontaneously.  Symmetric strength and range of motion and only withdraws her lower extremities to pain.  2+ bilateral radial pulse.  No history or exam findings to suggest recent traumatic injury or acute infectious process.  Low suspicion for toxic ingestion although will obtain UA to assess for evidence of UTI as patient's white blood cell count is slightly elevated. She is also given a small fluid bolus this appears slightly dehydrated and reported have been vomiting. Her troponin is just above normal limits I suspect this is more likely related to some demand ischemia in acute coronary thrombosis although we will plan to obtain a second troponin..  Given symptom onset less than 24 hours code stroke initiated. Patient seen and evaluated by neurology and CTA imaging ordered which does not show evidence of an acute stroke or other acute intracranial process.  CMP remarkable for glucose of 161 with no other  significant electrolyte or metabolic derangements.  CBC with WBC count  of 11.6 and no other significant derangements.  Per neurology recommendation we will plan to admit to medicine service. Neurology recommends obtaining MRI and further metabolic work-up as well as spot EEG in the morning. Keppra ordered by neurology. Patient will be admitted to medicine service.        ____________________________________________   FINAL CLINICAL IMPRESSION(S) / ED DIAGNOSES  Final diagnoses:  Altered mental status, unspecified altered mental status type  Troponin I above reference range    Medications  ondansetron (ZOFRAN) injection 4 mg (has no administration in time range)  lactated ringers bolus 500 mL (has no administration in time range)  levETIRAcetam (KEPPRA) IVPB 1000 mg/100 mL premix (has no administration in time range)  levETIRAcetam (KEPPRA) IVPB 500 mg/100 mL premix (has no administration in time range)  iohexol (OMNIPAQUE) 350 MG/ML injection 100 mL (100 mLs Intravenous Contrast Given 06/21/20 1622)     ED Discharge Orders    None       Note:  This document was prepared using Dragon voice recognition software and may include unintentional dictation errors.   Lucrezia Starch, MD 06/21/20 1730

## 2020-06-21 NOTE — ED Triage Notes (Signed)
Pt is brought in by EMS from home where she has 24hour care.  Pt had a stroke in September.  Pt was seen in ED for 4 nights and was discharged to [facility] for rehab on 12/10 but the care was not acceptable and family brought her home to care for her there.  Pt had some confusion after stroke as well as some left foot weakness.  Today caregiver got pt up to go to the bathroom at 1am and pt was very confused.  Last normal 10pm last night (12/15).  Pt was not verbal this am and not moving.  Pt became nauseated and vomited this am.  Pt had a virtual visit with palliative care and the nurse advised family should send her to the ER for further evaluation.

## 2020-06-21 NOTE — ED Notes (Signed)
MD notified of pt's bp.  

## 2020-06-21 NOTE — ED Notes (Signed)
Pt is alert and responds to me to to let me know that she does not have pain.  Pt is alert but not able to verbalize any further to answer any questions.

## 2020-06-21 NOTE — Progress Notes (Signed)
[facility] Consult Note Telephone: [phone]  Fax: [phone]  PATIENT NAME: Jenny [name] DOB: 1928-01-29 MRN: [id number]  PRIMARY CARE PROVIDER:   Derinda Late, MD  REFERRING PROVIDER:  Derinda Late, MD [phone] [address] [facility] [place] [place],  [place] [address]  RESPONSIBLE PARTY:    Jenny [name] (daughter) [phone] Jenny [name] (daughter) [phone] Jenny [name] (son) [phone]  Due to the COVID-19 crisis, this visit was done via telemedicine from my office and it was initiated and consent by this patient and or family.  1.Advance Care Planning; DNR; MOST form in vynca; discussed with Jenny [name] daughter and best plan for acute critical event with clinical presentation, sudden functional changes with decrease responsiveness to call 911 have transported to ed for further evaluation. Will have Hospital liaison to follow and schedule f/u PC visit once return home  2. Goals of Care: Goals include to maximize quality of life and symptom management. Our advance care planning conversation included a discussion about:   The value and importance of advance care planning  Exploration of personal, cultural or spiritual beliefs that might influence medical decisions  Exploration of goals of care in the event of a sudden injury or illness  Identification and preparation of a healthcare agent  Review and updating or creation of anadvance directive document.  3.Palliative care encounter; Palliative care encounter; Palliative medicine team will continue to support patient, patient's family, and medical team. Visit consisted of counseling and education dealing with the complex and emotionally intense issues of symptom management and palliative care in the setting of serious and potentially life-threatening illness  I spent 55 minutes providing this consultation,  from 10:15am to 11:25am.  More than 50% of the time in this consultation was spent coordinating communication.   HISTORY OF PRESENT ILLNESS:  Jenny [name] is a 86 y.o. year old female with multiple medical problems including Diane, Ms. Quiros daughter called concerning follow up Palliative care appointment today. Jenny [name] endorses currently [name] is vomiting, moaning when they try to get her to respond, in lying in the recliner they were not able to get her out. Asked Jenny [name] if we could do a telemedicine video visit and Jenny [name] in agreement. Jenny [name] and I talked about [name] working with therapy yesterday walking with her walker, eating herself and doing better with therapy. [name] previously had an episode where she was hospitalized thought to possibly have had a stroke from 49 / 6 / 2021 to 87 / 10 / 2021 with symptoms of severe weakness difficulty ambulating with work up significant for a fractured right great toe. Ms. Puleo was discharged to short-term rehab where family felt like she was not receiving adequate care and removed her within 48 hours back home with Home Health, Therapy. Ms. Twitty has been doing well up until today. Jenny [name] endorses [name] vomited last night and has not moved out of the recliner. Jenny [name] endorses [name] is total weight and they are unable to get her out of the chair. Jenny [name] was able to show [name] on video where she was lying in her recliner, moaning not able to open her eyes when spoken to. [name] appears severely critical ill. Discussed at length with Jenny [name] about calling 911 to take her to the hospital ER for re-evaluation. Jenny [name] endorses their wishes are to try to treat what they can at home if possible and she talked about her experience the  last hospitalization which was not favorable. We talked about the risk and the benefit. We talked about medical goals of care. We talked about [name] was walking yesterday and there could be many things that this could be including a stroke, sepsis,  dehydration severe, cardiac event. Discussed that it could also be something that can be corrected as since she was walking yesterday and family wants to treat what is treatable though conservatively with DNR in place. Jenny [name] in agreement to call 911 and have transported to the emergency department. Notified Hospice Liaisons of [name] returning to the emergency department .   Palliative Care was asked to help to continue to address goals of care.   CODE STATUS: DNR  PPS: 30% HOSPICE ELIGIBILITY/DIAGNOSIS: TBD  PAST MEDICAL HISTORY:  Past Medical History:  Diagnosis Date  . Depression   . Dyspnea   . Dysrhythmia   . GERD (gastroesophageal reflux disease)   . HBP (high blood pressure)   . Iron deficiency anemia due to chronic blood loss 04/23/2020  . Iron deficiency anemia due to chronic blood loss 04/23/2020  . Reflux   . Skin cancer   . Thyroid disease     SOCIAL HX:  Social History   Tobacco Use  . Smoking status: Never Smoker  . Smokeless tobacco: Never Used  Substance Use Topics  . Alcohol use: No    Alcohol/week: 0.0 standard drinks    ALLERGIES:  Allergies  Allergen Reactions  . Flagyl [Metronidazole] Nausea And Vomiting     PERTINENT MEDICATIONS:  Outpatient Encounter Medications as of 06/21/2020  Medication Sig  . acetaminophen (TYLENOL) 500 MG tablet Take 500 mg by mouth every 6 (six) hours as needed for mild pain, fever or headache.   Marland Kitchen apixaban (ELIQUIS) 5 MG TABS tablet Take 1 tablet (5 mg total) by mouth 2 (two) times daily.  Marland Kitchen atenolol (TENORMIN) 25 MG tablet Take 25 mg by mouth daily.  Marland Kitchen atorvastatin (LIPITOR) 10 MG tablet Take 10 mg by mouth daily.  . busPIRone (BUSPAR) 10 MG tablet Take 10 mg by mouth.   . cholecalciferol (VITAMIN D3) 25 MCG (1000 UNIT) tablet Take 1,000 Units by mouth daily.  Marland Kitchen doxycycline (VIBRAMYCIN) 100 MG capsule 10 mg.  . DULoxetine (CYMBALTA) 60 MG capsule Take 120 mg by mouth daily.   Marland Kitchen levothyroxine (SYNTHROID) 100 MCG  tablet Take 100 mcg by mouth daily before breakfast.  . Multiple Vitamins-Minerals (MULTIVITAMIN PO) Take 1 tablet by mouth daily.   . pantoprazole (PROTONIX) 40 MG tablet Take 40 mg by mouth 2 (two) times daily.   . [DISCONTINUED] FEROSUL 325 (65 Fe) MG tablet TAKE 1 TABLET BY MOUTH 3 TIMES DAILY WITH MEALS   No facility-administered encounter medications on file as of 06/21/2020.    PHYSICAL EXAM:   deferred  Christpoher Sievers Ihor Gully, NP

## 2020-06-21 NOTE — Consult Note (Signed)
Neurology Consultation  Reason for Consult: Code stroke for altered mental status and aphasia Referring Physician: Dr. Tamala Julian, EDP  CC: Altered mental status  History is obtained from: Chart, patient's daughter at bedside  HPI: Jenny [name] is a 86 y.o. female past medical history of atrial fibrillation, on anticoagulation with apixaban, recent stroke in September 2021 involving the right thalamus and to left frontal strokes with no residual deficits, at home requiring 24/7 care, presents for evaluation of altered mental status and no speech output. Last known well was sometime around 10 PM yesterday when she went to bed.  She has a caregiver who felt that she was not acting normally when she went up to go to the bathroom at night.  She was brought into the emergency room this morning and was waiting in the waiting room to be assessed by the ED provider.  When she was eventually assessed by the ED provider, he noticed that she was aphasic and activated a 24-hour LVO code stroke. She was seen emergently in the CAT scanner. Noncontrast head CT was reviewed.  No bleed.  Chronic changes. Advanced vascular imaging was performed and she was outside the 6-hour window.  Daughter does not report any preceding sicknesses illnesses.  No urinary urgency frequency.  No chest pain shortness of breath nausea vomiting fevers or chills. The daughter did mention that sitting in the waiting room, she had a blank stare look and also did have some rhythmic left arm movements.  LKW: 10 PM on 06/20/2020 tpa given?: no, outside the window, also on apixaban Premorbid modified Rankin scale (mRS): 4 EVT?-No, poor baseline modified Rankin, and eventually vascular imaging with no occlusion  ROS: Unable to perform due to patient's mentation  Past Medical History:  Diagnosis Date  . Depression   . Dyspnea   . Dysrhythmia   . GERD (gastroesophageal reflux disease)   . HBP (high blood pressure)   . Iron deficiency  anemia due to chronic blood loss 04/23/2020  . Iron deficiency anemia due to chronic blood loss 04/23/2020  . Reflux   . Skin cancer   . Thyroid disease     No family history on file.   Social History:   reports that she has never smoked. She has never used smokeless tobacco. She reports that she does not drink alcohol and does not use drugs.  Medications  Current Facility-Administered Medications:  .  ondansetron (ZOFRAN) injection 4 mg, 4 mg, Intravenous, Once, Lucrezia Starch, MD  Current Outpatient Medications:  .  acetaminophen (TYLENOL) 500 MG tablet, Take 500 mg by mouth every 6 (six) hours as needed for mild pain, fever or headache. , Disp: , Rfl:  .  apixaban (ELIQUIS) 5 MG TABS tablet, Take 1 tablet (5 mg total) by mouth 2 (two) times daily., Disp: 60 tablet, Rfl: 3 .  atenolol (TENORMIN) 25 MG tablet, Take 25 mg by mouth daily., Disp: , Rfl:  .  atorvastatin (LIPITOR) 10 MG tablet, Take 10 mg by mouth daily., Disp: , Rfl:  .  busPIRone (BUSPAR) 10 MG tablet, Take 10 mg by mouth. , Disp: , Rfl:  .  cholecalciferol (VITAMIN D3) 25 MCG (1000 UNIT) tablet, Take 1,000 Units by mouth daily., Disp: , Rfl:  .  doxycycline (VIBRAMYCIN) 100 MG capsule, 10 mg., Disp: , Rfl:  .  DULoxetine (CYMBALTA) 60 MG capsule, Take 120 mg by mouth daily. , Disp: , Rfl:  .  levothyroxine (SYNTHROID) 100 MCG tablet, Take 100 mcg by  mouth daily before breakfast., Disp: , Rfl:  .  Multiple Vitamins-Minerals (MULTIVITAMIN PO), Take 1 tablet by mouth daily. , Disp: , Rfl:  .  pantoprazole (PROTONIX) 40 MG tablet, Take 40 mg by mouth 2 (two) times daily. , Disp: , Rfl:    Exam: Current vital signs: BP (!) 156/124 (BP Location: Right Arm)   Pulse (!) 106   Temp 98.5 F (36.9 C) (Oral)   Resp 16   SpO2 100%  Vital signs in last 24 hours: Temp:  [98.5 F (36.9 C)-98.7 F (37.1 C)] 98.5 F (36.9 C) (12/16 1549) Pulse Rate:  [102-106] 106 (12/16 1549) Resp:  [15-16] 16 (12/16 1549) BP:  (156-162)/(107-124) 156/124 (12/16 1549) SpO2:  [100 %] 100 % (12/16 1549) General: Drowsy, opens eyes to voice but does not follow commands consistently initially but then followed some. HEENT: Normocephalic atraumatic Lungs: Clear next cardiovascular: Irregularly regular next abdomen soft nondistended nontender Extremities warm well perfused with trace edema Neurological exam Drowsy, opens eyes to voice. Initially did not follow any commands but later was able to mimic some commands such as holding the arms up. Forcefully closes her eyes when the examiner tries to open her eyes for examination. Is completely nonverbal Cranial nerves: Pupils equal round reactive to light, is able to look at both sides but not to commands, blinks to threat from both sides, difficult exam due to volitionally closing her eyes tight, face appears symmetric. Motor exam: Is able to hold both upper extremities antigravity.  Both lower extremities are not antigravity-at home walks with a walker according to the daughter.  To noxious stimulation, forceful withdrawal of the right, much stronger than that of the left lower extremity. Sensory exam: Intact based on grimace and withdrawal Coordination difficult to assess NIH stroke scale-14.  Labs I have reviewed labs in epic and the results pertinent to this consultation are:   CBC    Component Value Date/Time   WBC 11.6 (H) 06/21/2020 1221   WBC 11.4 (H) 06/21/2020 1221   RBC 5.45 (H) 06/21/2020 1221   RBC 5.44 (H) 06/21/2020 1221   HGB 15.0 06/21/2020 1221   HGB 14.9 06/21/2020 1221   HCT 46.1 (H) 06/21/2020 1221   HCT 46.4 (H) 06/21/2020 1221   PLT 295 06/21/2020 1221   PLT 297 06/21/2020 1221   MCV 84.6 06/21/2020 1221   MCV 85.3 06/21/2020 1221   MCH 27.5 06/21/2020 1221   MCH 27.4 06/21/2020 1221   MCHC 32.5 06/21/2020 1221   MCHC 32.1 06/21/2020 1221   RDW 15.0 06/21/2020 1221   RDW 15.3 06/21/2020 1221   LYMPHSABS 1.5 06/21/2020 1221   MONOABS  0.6 06/21/2020 1221   EOSABS 0.0 06/21/2020 1221   BASOSABS 0.0 06/21/2020 1221    CMP     Component Value Date/Time   NA 131 (L) 06/21/2020 1221   K 4.2 06/21/2020 1221   CL 93 (L) 06/21/2020 1221   CO2 25 06/21/2020 1221   GLUCOSE 161 (H) 06/21/2020 1221   BUN 17 06/21/2020 1221   CREATININE 0.73 06/21/2020 1221   CALCIUM 9.6 06/21/2020 1221   PROT 8.3 (H) 06/21/2020 1221   ALBUMIN 4.5 06/21/2020 1221   AST 27 06/21/2020 1221   ALT 22 06/21/2020 1221   ALKPHOS 29 (L) 06/21/2020 1221   BILITOT 0.8 06/21/2020 1221   GFRNONAA >60 06/21/2020 1221   GFRAA 58 (L) 04/09/2020 1156    Imaging I have reviewed the images obtained:  CT-scan of the brain-no news  changes.  No bleed.  Aspects 10. CTA head and neck with no ELVO.  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  Moderate to severe stenosis of the right P2 PCA and moderate stenosis of the basilar artery.  Neck imaging with no convincing stenosis.  Assessment: 86 year old woman past medical history of paroxysmal atrial fibrillation on anticoagulation with apixaban, recent stroke in September 2021 that involve the right thalamus and 2 left frontal strokes with no residual deficits, coming from home, where she requires 24/7 care, with complaints of altered mental status and no verbal output. She did appear aphasic on my exam as well but appeared encephalopathic as well. I suspect that at this time, given the negative CT and CTA along with CT perfusion studies, toxic metabolic encephalopathy due to metabolic derangements or UTI. Other differentials include seizures. Family is not inclined to do any aggressive measures. I would recommend admission and further work-up at [facility].  Recommendations: Urinalysis Chest x-ray Check ammonia levels Routine EEG in the morning Load  with Keppra-1 g IV now.  Followed by Keppra 500 twice daily MRI brain without  contrast when able to. Seizure precautions Continue Eliquis for stroke prevention.   Discussed with Dr. Tamala Julian in the ER.  Also discussed with the daughter at bedside  -- Amie Portland, MD [facility] Pager: [phone] If 7pm to 7am, please call on call as listed on AMION.   CRITICAL CARE ATTESTATION Performed by: Amie Portland, MD Total critical care time: 65 minutes Critical care time was exclusive of separately billable procedures and treating other patients and/or supervising APPs/Residents/Students Critical care was necessary to treat or prevent imminent or life-threatening deterioration due to toxic metabolic encephalopathy  This patient is critically ill and at significant risk for neurological worsening and/or death and care requires constant monitoring. Critical care was time spent personally by me on the following activities: development of treatment plan with patient and/or surrogate as well as nursing, discussions with consultants, evaluation of patient's response to treatment, examination of patient, obtaining history from patient or surrogate, ordering and performing treatments and interventions, ordering and review of laboratory studies, ordering and review of radiographic studies, pulse oximetry, re-evaluation of patient's condition, participation in multidisciplinary rounds and medical decision making of high complexity in the care of this patient.

## 2020-06-21 NOTE — H&P (Signed)
History and Physical    [place] Payge ZMO:[id number] DOB: 03-10-1928 DOA: 06/21/2020  PCP: Derinda Late, MD   Patient coming from: Home  I have personally briefly reviewed patient's old medical records in [place]  Chief Complaint: Altered mental status Most of the history was obtained from patient's daughter at the bedside  HPI: Jenny [name] is a 86 y.o. female with medical history significant for hypertension, GERD, depression, dyslipidemia, A. fib status post recent ischemic stroke in September, 2021 with some residual left lower extremity weakness who presents to the emergency room via EMS for evaluation of sudden onset change in mental status.  Per patient's daughter who was at the bedside patient was in her usual state of health and went to bed at about 10 PM last night at 1 AM they noticed a change in her mental status and she was unable to speak.  At baseline she is typically able to carry a conversation and is able to move her extremities on command.  Since early this morning she has been unable to follow commands and has been mumbling unintelligible words.  She was seen in the emergency room about a week ago following a fall and was discharged to a skilled nursing facility. I am unable to do a review of systems on this patient due to her mental status changes. Labs show sodium 131, potassium 4.2, chloride 93, bicarb 25.  5361, BUN 17, creatinine 0.73, calcium 9.6, alkaline phosphatase 29, albumin 4.5, AST 27, ALT 22, total protein 8.3, troponin XX 1 White count 11.4, hemoglobin 14.9, hematocrit 46.4, MCV 85.3, RDW 32.1, platelet count 297 Urine analysis is sterile Urine drug screen is negative CT scan of the head without contrast showed no evidence of acute intracranial abnormality.  ASPECTS is 10. Remote right thalamic infarct and microvascular ischemic disease. CT angiogram of the head and neck showed no convincing large vessel occlusion. 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 Moderate to severe stenosis of the right P2 PCA and moderate stenosis of the basilar artery. No evidence of hemodynamically significant stenosis in the neck. Twelve-lead EKG reviewed by me shows atrial fibrillation with a rapid ventricular rate    ED Course: Patient is a 86 year old Caucasian female with a known history of atrial fibrillation status post acute CVA with left-sided weakness who was brought into the emergency room by EMS for evaluation of mental status changes which daughter describes as unable to follow commands and unintelligible speech.  At baseline patient is able to carry out conversation and able to follow commands.  She was called in as a code stroke and initial CT scan of the head without contrast was negative for acute hemorrhage.  CT angiogram of the head and neck does not show any significant large vessel occlusion.  She will be admitted to the hospital for further evaluation.  Review of Systems: I am unable to do a review of systems on this patient due to her mental status change   Past Medical History:  Diagnosis Date  . Depression   . Dyspnea   . Dysrhythmia   . GERD (gastroesophageal reflux disease)   . HBP (high blood pressure)   . Iron deficiency anemia due to chronic blood loss 04/23/2020  . Iron deficiency anemia due to chronic blood loss 04/23/2020  . Reflux   . Skin cancer   . Thyroid disease  Past Surgical History:  Procedure Laterality Date  . BREAST BIOPSY Right   . CARDIOVERSION N/A 04/07/2018   Procedure: CARDIOVERSION;  Surgeon: Teodoro Spray, MD;  Location: [facility] ORS;  Service: Cardiovascular;  Laterality: N/A;  . ESOPHAGOGASTRODUODENOSCOPY (EGD) WITH PROPOFOL N/A 04/26/2015   Procedure: ESOPHAGOGASTRODUODENOSCOPY (EGD) WITH PROPOFOL;  Surgeon: Manya Silvas, MD;  Location: [facility] ENDOSCOPY;  Service: Endoscopy;  Laterality: N/A;  . KNEE  ARTHROSCOPY WITH LATERAL MENISECTOMY Left 03/22/2019   Procedure: KNEE ARTHROSCOPY WITH PARTIAL, MEDIAL AND LATERAL MENISECTOMY;  Surgeon: Hessie Knows, MD;  Location: [facility] ORS;  Service: Orthopedics;  Laterality: Left;  . NECK SURGERY       reports that she has never smoked. She has never used smokeless tobacco. She reports that she does not drink alcohol and does not use drugs.  Allergies  Allergen Reactions  . Flagyl [Metronidazole] Nausea And Vomiting    Family History  Family history unknown: Yes     Prior to Admission medications   Medication Sig Start Date End Date Taking? Authorizing Provider  acetaminophen (TYLENOL) 500 MG tablet Take 500 mg by mouth every 6 (six) hours as needed for mild pain, fever or headache.     [provider]  apixaban (ELIQUIS) 5 MG TABS tablet Take 1 tablet (5 mg total) by mouth 2 (two) times daily. 03/27/20   Teodoro Spray, MD  atenolol (TENORMIN) 25 MG tablet Take 25 mg by mouth daily. 05/02/20   [provider]  atorvastatin (LIPITOR) 10 MG tablet Take 10 mg by mouth daily. 01/19/20   [provider]  busPIRone (BUSPAR) 10 MG tablet Take 10 mg by mouth.  04/03/20 04/03/21  [provider]  cholecalciferol (VITAMIN D3) 25 MCG (1000 UNIT) tablet Take 1,000 Units by mouth daily.    [provider]  doxycycline (VIBRAMYCIN) 100 MG capsule 10 mg. 06/11/20   [provider]  DULoxetine (CYMBALTA) 60 MG capsule Take 120 mg by mouth daily.     [provider]  levothyroxine (SYNTHROID) 100 MCG tablet Take 100 mcg by mouth daily before breakfast.    [provider]  Multiple Vitamins-Minerals (MULTIVITAMIN PO) Take 1 tablet by mouth daily.     [provider]  pantoprazole (PROTONIX) 40 MG tablet Take 40 mg by mouth 2 (two) times daily.  05/07/13   [provider]  FEROSUL 325 (65 Fe) MG tablet TAKE 1 TABLET BY MOUTH 3 TIMES DAILY WITH MEALS 05/15/20 06/15/20  Earlie Server, MD     Physical Exam: Vitals:   06/21/20 1549 06/21/20 1615 06/21/20 1700 06/21/20 1740  BP: (!) 156/124 (!) 166/100 (!) 157/94 (!) 180/119  Pulse: (!) 106 88 81 94  Resp: 16 (!) 26 (!) 21 18  Temp: 98.5 F (36.9 C)     TempSrc: Oral     SpO2: 100% 95% 96% 91%     Vitals:   06/21/20 1549 06/21/20 1615 06/21/20 1700 06/21/20 1740  BP: (!) 156/124 (!) 166/100 (!) 157/94 (!) 180/119  Pulse: (!) 106 88 81 94  Resp: 16 (!) 26 (!) 21 18  Temp: 98.5 F (36.9 C)     TempSrc: Oral     SpO2: 100% 95% 96% 91%    Constitutional: NAD, lethargic and withdraws from painful stimuli.  Able to move all extremities Eyes: PERRL, lids and conjunctivae normal ENMT: Mucous membranes are moist.  Neck: normal, supple, no masses, no thyromegaly Respiratory: clear to auscultation bilaterally, no wheezing, no crackles. Normal respiratory effort.  No accessory muscle use.  Cardiovascular: Irregularly irregular, tachycardic, no murmurs / rubs / gallops. No extremity edema. 2+ pedal pulses. No carotid bruits.  Abdomen: no tenderness, no masses palpated. No hepatosplenomegaly. Bowel sounds positive.  Musculoskeletal: no clubbing / cyanosis. No joint deformity upper and lower extremities.  Skin: no rashes, lesions, ulcers.  Neurologic: Moves all extremities in  response to painful stimuli Psychiatric: Normal mood and affect.   Labs on Admission: I have personally reviewed following labs and imaging studies  CBC: Recent Labs  Lab 06/15/20 0947 06/21/20 1221  WBC 9.8 11.4*  11.6*  NEUTROABS  --  9.2*  HGB 14.8 14.9  15.0  HCT 46.2* 46.4*  46.1*  MCV 86.7 85.3  84.6  PLT 271 297  657   Basic Metabolic Panel: Recent Labs  Lab 06/21/20 1221 06/21/20 1604  NA 131*  --   K 4.2  --   CL 93*  --   CO2 25  --   GLUCOSE 161*  --   BUN 17  --   CREATININE 0.73  --   CALCIUM 9.6  --   MG  --  1.9   GFR: Estimated Creatinine Clearance: 51.9 mL/min (by C-G formula based on SCr of 0.73  mg/dL). Liver Function Tests: Recent Labs  Lab 06/21/20 1221  AST 27  ALT 22  ALKPHOS 29*  BILITOT 0.8  PROT 8.3*  ALBUMIN 4.5   No results for input(s): LIPASE, AMYLASE in the last 168 hours. No results for input(s): AMMONIA in the last 168 hours. Coagulation Profile: Recent Labs  Lab 06/21/20 1604  INR 1.1   Cardiac Enzymes: No results for input(s): CKTOTAL, CKMB, CKMBINDEX, TROPONINI in the last 168 hours. BNP (last 3 results) No results for input(s): PROBNP in the last 8760 hours. HbA1C: No results for input(s): HGBA1C in the last 72 hours. CBG: Recent Labs  Lab 06/21/20 1637  GLUCAP 175*   Lipid Profile: No results for input(s): CHOL, HDL, LDLCALC, TRIG, CHOLHDL, LDLDIRECT in the last 72 hours. Thyroid Function Tests: No results for input(s): TSH, T4TOTAL, FREET4, T3FREE, THYROIDAB in the last 72 hours. Anemia Panel: No results for input(s): VITAMINB12, FOLATE, FERRITIN, TIBC, IRON, RETICCTPCT in the last 72 hours. Urine analysis:    Component Value Date/Time   COLORURINE YELLOW (A) 06/21/2020 1646   APPEARANCEUR HAZY (A) 06/21/2020 1646   LABSPEC >1.046 (H) 06/21/2020 1646   PHURINE 7.0 06/21/2020 1646   GLUCOSEU 50 (A) 06/21/2020 1646   HGBUR NEGATIVE 06/21/2020 1646   BILIRUBINUR NEGATIVE 06/21/2020 1646   KETONESUR 5 (A) 06/21/2020 1646   PROTEINUR 100 (A) 06/21/2020 1646   NITRITE NEGATIVE 06/21/2020 1646   LEUKOCYTESUR NEGATIVE 06/21/2020 1646    Radiological Exams on Admission: CT Code Stroke CTA Head W/WO contrast  Result Date: 06/21/2020 CLINICAL DATA:  Stroke follow-up. EXAM: CT ANGIOGRAPHY HEAD AND NECK TECHNIQUE: Multidetector CT imaging of the head and neck was performed using the standard protocol during bolus administration of intravenous contrast. Multiplanar CT image reconstructions and MIPs were obtained to evaluate the vascular anatomy. Carotid stenosis measurements (when applicable) are obtained utilizing NASCET criteria, using the  distal internal carotid diameter as the denominator. CONTRAST:  142mL OMNIPAQUE IOHEXOL 350 MG/ML SOLN COMPARISON:  Same day head CT. FINDINGS: CTA NECK FINDINGS Aortic arch: Great vessel origins are patent. Calcific atherosclerosis. Right carotid system: No evidence of dissection, stenosis (50% or greater) or occlusion. Mild atherosclerosis at the bifurcation. Left carotid system: No evidence of dissection, stenosis (50% or  greater) or occlusion. Mild atherosclerosis at the bifurcation. Vertebral arteries: Codominant. No evidence of dissection, stenosis (50% or greater) or occlusion. Mild multifocal narrowing of the right vertebral artery, likely related to atherosclerosis. Skeleton: No acute fracture. Other neck: No mass or suspicious adenopathy. Upper chest: Negative. Review of the MIP images confirms the above findings CTA HEAD FINDINGS Anterior circulation: No significant stenosis, proximal occlusion, aneurysm, or vascular malformation. 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 Moderate to severe stenosis of the right P2 PCA with distal opacification. 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 Mild left P2 PCA stenosis. Venous sinuses: Poorly visualized given arterial timing. Anatomic variants: Hypoplastic left P1 PCA with fetal type left PCA. Review of the MIP images confirms the above findings IMPRESSION: 1. No convincing large vessel occlusion. 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 2. Moderate to severe stenosis of the right P2 PCA and moderate stenosis of the basilar artery. 3. No evidence of hemodynamically significant stenosis in the neck. Findings discussed with Dr. Rory Percy  at 4:50 p.m. via telephone. Electronically Signed   By: Margaretha Sheffield MD   On: 06/21/2020 17:01   CT Code Stroke CTA Neck W/WO contrast  Result Date: 06/21/2020 CLINICAL DATA:  Stroke follow-up. EXAM: CT  ANGIOGRAPHY HEAD AND NECK TECHNIQUE: Multidetector CT imaging of the head and neck was performed using the standard protocol during bolus administration of intravenous contrast. Multiplanar CT image reconstructions and MIPs were obtained to evaluate the vascular anatomy. Carotid stenosis measurements (when applicable) are obtained utilizing NASCET criteria, using the distal internal carotid diameter as the denominator. CONTRAST:  129mL OMNIPAQUE IOHEXOL 350 MG/ML SOLN COMPARISON:  Same day head CT. FINDINGS: CTA NECK FINDINGS Aortic arch: Great vessel origins are patent. Calcific atherosclerosis. Right carotid system: No evidence of dissection, stenosis (50% or greater) or occlusion. Mild atherosclerosis at the bifurcation. Left carotid system: No evidence of dissection, stenosis (50% or greater) or occlusion. Mild atherosclerosis at the bifurcation. Vertebral arteries: Codominant. No evidence of dissection, stenosis (50% or greater) or occlusion. Mild multifocal narrowing of the right vertebral artery, likely related to atherosclerosis. Skeleton: No acute fracture. Other neck: No mass or suspicious adenopathy. Upper chest: Negative. Review of the MIP images confirms the above findings CTA HEAD FINDINGS Anterior circulation: No significant stenosis, proximal occlusion, aneurysm, or vascular malformation. 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 Moderate to severe stenosis of the right P2 PCA with distal opacification. 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 Mild left P2 PCA stenosis. Venous sinuses: Poorly visualized given arterial timing. Anatomic variants: Hypoplastic left P1 PCA with fetal type left PCA. Review of the MIP images confirms the above findings IMPRESSION: 1. No convincing large vessel occlusion. 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 2. Moderate to severe stenosis of the right P2  PCA and moderate stenosis of the basilar artery. 3. No evidence of hemodynamically significant stenosis in the neck. Findings discussed with Dr. Rory Percy  at 4:50 p.m. via telephone. Electronically Signed   By: Margaretha Sheffield MD   On: 06/21/2020 17:01   CT Code Stroke Cerebral Perfusion with contrast  Result Date: 06/21/2020 CLINICAL DATA:  Stroke follow-up. EXAM: CT PERFUSION BRAIN TECHNIQUE: Multiphase CT imaging of the brain was performed following IV bolus contrast injection. Subsequent parametric  perfusion maps were calculated using RAPID software. CONTRAST:  175mL OMNIPAQUE IOHEXOL 350 MG/ML SOLN COMPARISON:  Same day CT imaging FINDINGS: CT Brain Perfusion Findings: CBF (<30%) Volume: 35mL Perfusion (Tmax>6.0s) volume: 44mL Mismatch Volume: 8mL ASPECTS on noncontrast CT Head: 10. Infarct Core: 0 mL Areas of reported perfusion abnormality in bilateral frontal lobes are favored to reflect artifact given they do not correlate with a vascular territory and given artifact in this region. IMPRESSION: No convincing evidence of perfusion abnormality. Small areas of reported perfusion abnormality are favored to reflect artifact given they do not correlate with a vascular territory and given artifact in this region. Findings discussed with Dr. Rory Percy  at 4:50 p.m. via telephone. Electronically Signed   By: Margaretha Sheffield MD   On: 06/21/2020 17:04   CT HEAD CODE STROKE WO CONTRAST  Result Date: 06/21/2020 CLINICAL DATA:  Code stroke. Neuro deficit, acute stroke suspected. Altered mental status. EXAM: CT HEAD WITHOUT CONTRAST TECHNIQUE: Contiguous axial images were obtained from the base of the skull through the vertex without intravenous contrast. COMPARISON:  CT head June 11, 2020.  MRI June 12, 2020. FINDINGS: Brain: No evidence of acute large vascular territory infarction, hemorrhage, hydrocephalus, extra-axial collection or mass lesion/mass effect. Remote infarct involving the ventral right thalamus.  Moderate patchy white matter hypoattenuation, most likely related to chronic microvascular ischemic disease. Vascular: Calcific atherosclerosis. No hyperdense vessel identified. Skull: No acute fracture. Sinuses/Orbits: Visualized sinuses are clear.  Unremarkable orbits. Other: No mastoid effusions. ASPECTS [facility] Stroke Program Early CT Score) Total score (0-10 with 10 being normal): 10. IMPRESSION: 1. No evidence of acute intracranial abnormality.  ASPECTS is 10. 2. Remote right thalamic infarct and microvascular ischemic disease. Code stroke imaging results were communicated on 06/21/2020 at 4:27 pm to provider Dr. Rory Percy via secure text paging. Electronically Signed   By: Margaretha Sheffield MD   On: 06/21/2020 16:28    EKG: Independently reviewed.  Atrial fibrillation with rapid ventricular rate  Assessment/Plan Principal Problem:   AMS (altered mental status) Active Problems:   Atrial fibrillation, chronic (HCC)   Hypothyroid     Altered mental status Rule out acute stroke versus metabolic encephalopathy Patient has had a prior stroke with some mild left lower extremity weakness and at baseline is able to carry out conversation and follow commands She is unable to do that now and by the time she presented was out of the window for TPA (last known well was about 10 PM the night prior to her presentation) CT angiogram does not show any significant large vessel occlusion Obtain MRI of the brain to rule out acute stroke We will request speech therapy/occupational therapy/physical therapy consult Continue Eliquis Continue high intensity statins Allow for permissive hypertension until an acute stroke is ruled out We will administer labetalol for systolic blood pressure greater than 209OBSJ and diastolic blood pressure greater than 141mmHg Per neurology we will start patient on Keppra 500mg  BID Obtain EEG Place patient on aspiration and seizure precautions    Chronic atrial  fibrillation Continue apixaban as secondary prophylaxis for an acute stroke   Hypothyroidism Continue Synthroid once patient is able to take p.o.   DVT prophylaxis: Apixaban Code Status: DO NOT RESUSCITATE Family Communication: Greater than 50% of time was spent discussing patient's condition and plan of care with her daughter Luanna Cole was at the bedside.  All questions and concerns have been addressed.  She verbalizes understanding and agrees with the plan.  CODE STATUS was discussed and patient  is a DO NOT RESUSCITATE Disposition Plan: Back to previous home environment Consults called: Neurology    Collier Bullock MD [facility]     06/21/2020, 6:02 PM

## 2020-06-21 NOTE — ED Notes (Signed)
CODE  STROKE  CALLED  TO [place]

## 2020-06-21 NOTE — ED Notes (Signed)
Pt will arouse at times and appears to respond by opening eyes and trying to respond. No regular verbal response at this point, except occasional short one word response

## 2020-06-22 ENCOUNTER — Inpatient Hospital Stay: Payer: Medicare Other

## 2020-06-22 DIAGNOSIS — R400 Somnolence: Secondary | ICD-10-CM

## 2020-06-22 DIAGNOSIS — R4182 Altered mental status, unspecified: Secondary | ICD-10-CM

## 2020-06-22 LAB — LIPID PANEL
Cholesterol: 152 mg/dL (ref 0–200)
HDL: 55 mg/dL (ref >40)
LDL Cholesterol: 81 mg/dL (ref 0–99)
Total CHOL/HDL Ratio: 2.8 RATIO
Triglycerides: 80 mg/dL (ref <150)
VLDL: 16 mg/dL (ref 0–40)

## 2020-06-22 LAB — AMMONIA: Ammonia: 10 umol/L (ref 9–35)

## 2020-06-22 LAB — VITAMIN B12: Vitamin B-12: 397 pg/mL (ref 180–914)

## 2020-06-22 LAB — HEMOGLOBIN A1C
Hgb A1c MFr Bld: 6.6 % — ABNORMAL HIGH (ref 4.8–5.6)
Mean Plasma Glucose: 142.72 mg/dL

## 2020-06-22 MED ORDER — DULOXETINE HCL 60 MG PO CPEP
60.0000 mg | ORAL_CAPSULE | Freq: Every day | ORAL | Status: DC
  Administered 2020-06-23 – 2020-06-25 (×3): 60 mg via ORAL
  Filled 2020-06-22 (×3): qty 1

## 2020-06-22 MED ORDER — LEVOTHYROXINE SODIUM 100 MCG/5ML IV SOLN: 50.0000 ug | Freq: Every day | INTRAVENOUS | Status: DC

## 2020-06-22 NOTE — Evaluation (Signed)
Clinical/Bedside Swallow Evaluation Patient Details  Name: Jenny [name] MRN: [id number] Date of Birth: 05/17/1928  Today's Date: 06/22/2020 Time: SLP Start Time (ACUTE ONLY): 66 SLP Stop Time (ACUTE ONLY): 1715 SLP Time Calculation (min) (ACUTE ONLY): 45 min  Past Medical History:  Past Medical History:  Diagnosis Date  . Depression   . Dyspnea   . Dysrhythmia   . GERD (gastroesophageal reflux disease)   . HBP (high blood pressure)   . Iron deficiency anemia due to chronic blood loss 04/23/2020  . Iron deficiency anemia due to chronic blood loss 04/23/2020  . Reflux   . Skin cancer   . Thyroid disease    Past Surgical History:  Past Surgical History:  Procedure Laterality Date  . BREAST BIOPSY Right   . CARDIOVERSION N/A 04/07/2018   Procedure: CARDIOVERSION;  Surgeon: Teodoro Spray, MD;  Location: [facility] ORS;  Service: Cardiovascular;  Laterality: N/A;  . ESOPHAGOGASTRODUODENOSCOPY (EGD) WITH PROPOFOL N/A 04/26/2015   Procedure: ESOPHAGOGASTRODUODENOSCOPY (EGD) WITH PROPOFOL;  Surgeon: Manya Silvas, MD;  Location: [facility] ENDOSCOPY;  Service: Endoscopy;  Laterality: N/A;  . KNEE ARTHROSCOPY WITH LATERAL MENISECTOMY Left 03/22/2019   Procedure: KNEE ARTHROSCOPY WITH PARTIAL, MEDIAL AND LATERAL MENISECTOMY;  Surgeon: Hessie Knows, MD;  Location: [facility] ORS;  Service: Orthopedics;  Laterality: Left;  . NECK SURGERY     HPI:  86 year old woman with past medical history of paroxysmal atrial fibrillation on anticoagulation with apixaban, recent right thalamic capsular stroke and two left frontal strokes with no residual deficits, coming from home with complaints of altered mental status and no verbal output. She was aphasic on the exam yesterday but seems to be having some verbal output now. She has had a steady cognitive decline but was able to communicate well up until the day prior to presentation when she went to bed and woke up with the symptoms essentially. Upon further  discussions with the family, she has been having some changes in her personality after her stroke in October-daughter describes her as a rule follower who now could care less about things. Also, she has had transient global amnesia-this information was provided to me today-many years ago and daughter said that her symptoms lasted for 3 weeks.86 year old woman with past medical history of paroxysmal atrial fibrillation on anticoagulation with apixaban, recent right thalamic capsular stroke and to left frontal strokes with no residual deficits, coming from home with complaints of altered mental status and no verbal output. She was aphasic on the exam yesterday but seems to be having some verbal output now. She has had a steady cognitive decline but was able to communicate well up until the day prior to presentation when she went to bed and woke up with the symptoms essentially. Upon further discussions with the family, she has been having some changes in her personality after her stroke in October-daughter describes her as a rule follower who now could care less about things. Also, she has had transient global amnesia-this information was provided to me today-many years ago and daughter said that her symptoms lasted for 3 weeks.   Assessment / Plan / Recommendation Clinical Impression  Pt's mental status is greatly improved this afternoon. As such, she stated she was hungry and thirsty. Pt presents with grossly adequate oropharyngeal abilities when consuming thin liquids via straw, puree and regular textures. Pt's oral phase was c/b functional mastication, use of your tongue to clean food particles from her teeth when consuming potato chips, graham crackers, and applesauce. She was  able to hold bag of chip and applesauce with good ability to feed herself. Pt's pharyngeal phase appeared adequate d/t the appearance of a swift swallow, no change in vitals, no vocal change and no overt s/s of aspiration. It is  important to note that pt doesn't have oropharyngeal dysphagia but she does still have cognitive impairments and as such, pt is a poor conveyor of information. For example, pt did not want to initially swallow thin liquids or the applesauce d/t pain when swallowing. However when given the potato chips all complaints resolved and there was no evidence of pain. After consuming potato chips, pt given applesauce and she consumed container without any report of pain as well as 4 oz of diet cola. It is better to observe pt in functional task than to rely on her verbal responses. Pt is appropriate for upgrade to regular diet with thin liquids via straw, medicine whole in puree or crushed in puree if needed. Education provided to pt's daughter and nurse. No follow up for dysphagia indicated but ST will continue following for cognition. SLP Visit Diagnosis: Cognitive communication deficit (R41.841);Dysphagia, unspecified (R13.10)    Aspiration Risk  Mild aspiration risk    Diet Recommendation Regular;Thin liquid   Liquid Administration via: Straw Medication Administration: Whole meds with puree Supervision: Patient able to self feed;Intermittent supervision to cue for compensatory strategies Compensations: Minimize environmental distractions;Slow rate;Small sips/bites Postural Changes: Seated upright at 90 degrees    Other  Recommendations Oral Care Recommendations: Oral care BID   Follow up Recommendations None      Frequency and Duration            Prognosis        Swallow Study   General Date of Onset: 06/21/20 HPI: 86 year old woman with past medical history of paroxysmal atrial fibrillation on anticoagulation with apixaban, recent right thalamic capsular stroke and two left frontal strokes with no residual deficits, coming from home with complaints of altered mental status and no verbal output. She was aphasic on the exam yesterday but seems to be having some verbal output now. She has had  a steady cognitive decline but was able to communicate well up until the day prior to presentation when she went to bed and woke up with the symptoms essentially. Upon further discussions with the family, she has been having some changes in her personality after her stroke in October-daughter describes her as a rule follower who now could care less about things. Also, she has had transient global amnesia-this information was provided to me today-many years ago and daughter said that her symptoms lasted for 3 weeks.86 year old woman with past medical history of paroxysmal atrial fibrillation on anticoagulation with apixaban, recent right thalamic capsular stroke and to left frontal strokes with no residual deficits, coming from home with complaints of altered mental status and no verbal output. She was aphasic on the exam yesterday but seems to be having some verbal output now. She has had a steady cognitive decline but was able to communicate well up until the day prior to presentation when she went to bed and woke up with the symptoms essentially. Upon further discussions with the family, she has been having some changes in her personality after her stroke in October-daughter describes her as a rule follower who now could care less about things. Also, she has had transient global amnesia-this information was provided to me today-many years ago and daughter said that her symptoms lasted for 3 weeks. Type of Study: Bedside  Swallow Evaluation Previous Swallow Assessment: none in chart Diet Prior to this Study: NPO Temperature Spikes Noted: No Respiratory Status: Room air History of Recent Intubation: No Behavior/Cognition: Alert;Cooperative;Pleasant mood;Distractible;Requires cueing (very HOH) Oral Cavity Assessment: Within Functional Limits Oral Care Completed by SLP: Recent completion by staff Oral Cavity - Dentition: Adequate natural dentition Vision: Functional for self-feeding Self-Feeding Abilities:  Able to feed self Patient Positioning: Upright in bed Baseline Vocal Quality: Normal Volitional Cough: Strong Volitional Swallow: Able to elicit    Oral/Motor/Sensory Function Overall Oral Motor/Sensory Function: Within functional limits   Ice Chips Ice chips: Not tested   Thin Liquid Thin Liquid: Within functional limits Presentation: Self Fed;Straw    Nectar Thick Nectar Thick Liquid: Not tested   Honey Thick Honey Thick Liquid: Not tested   Puree Puree: Within functional limits Presentation: Self Fed;Spoon   Solid     Solid: Within functional limits Presentation: Self Fed     Kele Barthelemy B. Rutherford Nail M.S., CCC-SLP, [place] Office [phone]  Tannah Dreyfuss 06/22/2020,4:25 PM

## 2020-06-22 NOTE — Progress Notes (Signed)
EEG with cortical dysfunction- Left frontotemporal region secondary to an underlying abnormality or postictal state.  Moderate to diffuse generalized encephalopathy also seen. No seizure discharges seen during the recording.  Updated recommendations: -I would continue to treat her with Keppra. -We will evaluate again tomorrow. -We will follow up on the rest of the recommendations from the earlier progress note.  -- Amie Portland, MD [facility] Pager: [phone] If 7pm to 7am, please call on call as listed on [facility]ION.

## 2020-06-22 NOTE — Progress Notes (Signed)
SLP Cancellation Note  Patient Details Name: Jenny [name] MRN: [id number] DOB: 1928/03/20   Cancelled treatment:       Reason Eval/Treat Not Completed: Fatigue/lethargy limiting ability to participate  Pt is largely obtunded, recommend continued NPO with all medicine thru IV. ST will follow chart for appropriateness.   Demmi Sindt B. Rutherford Nail M.S., CCC-SLP, [place] Office [phone]   Stormy Fabian 06/22/2020, 1:53 PM

## 2020-06-22 NOTE — Progress Notes (Signed)
OT Cancellation Note  Patient Details Name: Jenny [name] MRN: [id number] DOB: 03/30/1928   Cancelled Treatment:    Reason Eval/Treat Not Completed: Fatigue/lethargy limiting ability to participate  Upon chart review, noted that pt is still largely obtunded at this time. Will f/u for OT evaluation at later date/time as able/appropriate. Thank you.  Gerrianne Scale, [place], OTR/L ascom [phone] 06/22/20, 2:26 PM

## 2020-06-22 NOTE — Procedures (Signed)
  Patient Name: Jenny [name]  MRN: [id number]  Epilepsy Attending: Lora Havens  Referring Physician/Provider: Dr Collier Bullock Date: 06/22/2020 Duration: 39.18 mins  Patient history: 86 year old woman with past medical history of paroxysmal atrial fibrillation on anticoagulation with apixaban, recent right thalamic capsular stroke and to left frontal strokes with no residual deficits, coming from home with complaints of altered mental status and no verbal output. EEG to evaluate for seizure.   Level of alertness: Awake  AEDs during EEG study: LEV  Technical aspects: This EEG study was done with scalp electrodes positioned according to the 10-20 International system of electrode placement. Electrical activity was acquired at a sampling rate of 500Hz  and reviewed with a high frequency filter of 70Hz  and a low frequency filter of 1Hz . EEG data were recorded continuously and digitally stored.   Description: EEG showed continuous generalized and maximal left frontotemporal region 3 to 6 Hz theta-delta slowing. Hyperventilation and photic stimulation were not performed.     ABNORMALITY -Continuous slow, generalized and maximal left  frontotemporal region  IMPRESSION: This study is suggestive of cortical dysfunction in left frontotemporal region secondary to underlying structural abnormality, post-ictal state. Additionally there is moderate diffuse encephalopathy, nonspecific etiology. No seizures or epileptiform discharges were seen throughout the recording.  Jenny [name]

## 2020-06-22 NOTE — Evaluation (Signed)
Speech Language Pathology Evaluation Patient Details Name: Jenny [name] MRN: [id number] DOB: 1927/10/06 Today's Date: 06/22/2020 Time: 0955-1010 SLP Time Calculation (min) (ACUTE ONLY): 15 min  Problem List:  Patient Active Problem List   Diagnosis Date Noted  . Iron deficiency anemia due to chronic blood loss 04/23/2020  . Embolic stroke ([place]) 73/22/0254  . Acute metabolic encephalopathy 27/12/2374  . Fall at home, initial encounter 03/24/2020  . AF (paroxysmal atrial fibrillation) ([place]) 03/24/2020  . Atrial fibrillation, chronic ([place]) 03/24/2020  . Hypothyroid 03/24/2020  . GERD (gastroesophageal reflux disease) 03/24/2020  . AMS (altered mental status) 03/24/2020  . Bradycardia 03/24/2020  . Severe epistaxis 03/24/2020  . Avitaminosis D 06/07/2015  . Back ache 11/22/2013  . Artery disease, cerebral 10/26/2013  . Diverticulitis of colon 10/26/2013  . Acid reflux 10/26/2013  . Calcium blood increased 10/26/2013  . HLD (hyperlipidemia) 10/26/2013   Past Medical History:  Past Medical History:  Diagnosis Date  . Depression   . Dyspnea   . Dysrhythmia   . GERD (gastroesophageal reflux disease)   . HBP (high blood pressure)   . Iron deficiency anemia due to chronic blood loss 04/23/2020  . Iron deficiency anemia due to chronic blood loss 04/23/2020  . Reflux   . Skin cancer   . Thyroid disease    Past Surgical History:  Past Surgical History:  Procedure Laterality Date  . BREAST BIOPSY Right   . CARDIOVERSION N/A 04/07/2018   Procedure: CARDIOVERSION;  Surgeon: Teodoro Spray, MD;  Location: [facility] ORS;  Service: Cardiovascular;  Laterality: N/A;  . ESOPHAGOGASTRODUODENOSCOPY (EGD) WITH PROPOFOL N/A 04/26/2015   Procedure: ESOPHAGOGASTRODUODENOSCOPY (EGD) WITH PROPOFOL;  Surgeon: Manya Silvas, MD;  Location: [facility] ENDOSCOPY;  Service: Endoscopy;  Laterality: N/A;  . KNEE ARTHROSCOPY WITH LATERAL MENISECTOMY Left 03/22/2019   Procedure: KNEE ARTHROSCOPY WITH  PARTIAL, MEDIAL AND LATERAL MENISECTOMY;  Surgeon: Hessie Knows, MD;  Location: [facility] ORS;  Service: Orthopedics;  Laterality: Left;  . NECK SURGERY     HPI:  86 year old woman with past medical history of paroxysmal atrial fibrillation on anticoagulation with apixaban, recent right thalamic capsular stroke and two left frontal strokes with no residual deficits, coming from home with complaints of altered mental status and no verbal output. She was aphasic on the exam yesterday but seems to be having some verbal output now. She has had a steady cognitive decline but was able to communicate well up until the day prior to presentation when she went to bed and woke up with the symptoms essentially. Upon further discussions with the family, she has been having some changes in her personality after her stroke in October-daughter describes her as a rule follower who now could care less about things. Also, she has had transient global amnesia-this information was provided to me today-many years ago and daughter said that her symptoms lasted for 3 weeks.86 year old woman with past medical history of paroxysmal atrial fibrillation on anticoagulation with apixaban, recent right thalamic capsular stroke and to left frontal strokes with no residual deficits, coming from home with complaints of altered mental status and no verbal output. She was aphasic on the exam yesterday but seems to be having some verbal output now. She has had a steady cognitive decline but was able to communicate well up until the day prior to presentation when she went to bed and woke up with the symptoms essentially. Upon further discussions with the family, she has been having some changes in her personality after her stroke in  October-daughter describes her as a rule follower who now could care less about things. Also, she has had transient global amnesia-this information was provided to me today-many years ago and daughter said that her symptoms  lasted for 3 weeks.   Assessment / Plan / Recommendation Clinical Impression  Pt presents as largely obtunded. She was responsive to her name and attempted to tell me how she was doing. Pt was unable to complete response d/t fading alertness. At this time, recommend pt remain NPO with education provided to pt's MD to continue IV medicines. Education completed with pt's daughter and all questions answered to her satisfaction. ST to follow chart for appropriateness.    SLP Assessment  SLP Recommendation/Assessment: Patient needs continued Speech Lanaguage Pathology Services SLP Visit Diagnosis: Cognitive communication deficit (R41.841)    Follow Up Recommendations   (Palliative Care consult)    Frequency and Duration min 1 x/week  1 week      SLP Evaluation Cognition  Overall Cognitive Status: Impaired/Different from baseline Arousal/Alertness: Lethargic Orientation Level: Oriented to person       Comprehension       Expression Verbal Expression Overall Verbal Expression: Impaired (d/t current state)   Oral / Motor  Oral Motor/Sensory Function Overall Oral Motor/Sensory Function:  (no response to tactile at lips, per daughter pt didn't have any motor response to yogurt yesterday)   GO                   Meribeth Vitug B. Rutherford Nail M.S., CCC-SLP, [place] Office [phone]  Stormy Fabian 06/22/2020, 1:47 PM

## 2020-06-22 NOTE — Progress Notes (Addendum)
[facility] Liaison note:  Patient is currently followed by [facility] outpatient Palliative service. TOC Misty Green made aware. Will follow for discharge disposition. Thank you.  Flo Shanks BSN, RN, [place] [facility] [phone]

## 2020-06-22 NOTE — Progress Notes (Signed)
Neurology Progress Note   S:// Seen and examined. Daughter at bedside No acute changes overnight   O:// Current vital signs: BP (!) 161/89 (BP Location: Right Arm)   Pulse 84   Temp 99.1 F (37.3 C) (Oral)   Resp 16   Ht 5\' 7"  (1.702 m)   Wt 83.3 kg   SpO2 95%   BMI 28.76 kg/m  Vital signs in last 24 hours: Temp:  [97.3 F (36.3 C)-99.1 F (37.3 C)] 99.1 F (37.3 C) (12/17 0800) Pulse Rate:  [75-106] 84 (12/17 0800) Resp:  [15-26] 16 (12/17 0800) BP: (123-181)/(78-124) 161/89 (12/17 0800) SpO2:  [89 %-100 %] 95 % (12/17 0558) Weight:  [83.3 kg] 83.3 kg (12/17 0302) General: Sleeping in bed, opens eyes to voice but does not follow commands HEENT: Normocephalic atraumatic CVS: Irregularly irregular Respiratory: Breathing well saturating normally on room air Extremities warm well perfused Neurological exam Sitting in bed, opens eyes to voice, extremely hard of hearing. Does not follow commands Was able to tell me her name as well as her date of birth. When I asked her who her daughter was, she is not able to name her daughter. When I asked her how many children she had-initially said to.  She has 3 children.  She was not able to name her children. Extremely poor attention concentration kept falling asleep. Cranial nerves: Pupils equal round react to light, extraocular movements are not restricted, blinks to threat from both sides, face appears symmetric, auditory acuity is extremely reduced even with hearing aids in place can hardly hear. Motor exam: Bilateral upper extremities are antigravity.  Moves both lower extremities in response to pain. Sensory exam: As above Coordination difficult to assess given her mentation  Medications  Current Facility-Administered Medications:  .   stroke: mapping our early stages of recovery book, , Does not apply, Once, Agbata, Tochukwu, MD .  acetaminophen (TYLENOL) tablet 650 mg, 650 mg, Oral, Q4H PRN **OR** acetaminophen (TYLENOL)  160 MG/5ML solution 650 mg, 650 mg, Per Tube, Q4H PRN **OR** acetaminophen (TYLENOL) suppository 650 mg, 650 mg, Rectal, Q4H PRN, Agbata, Tochukwu, MD .  apixaban (ELIQUIS) tablet 5 mg, 5 mg, Oral, BID, Agbata, Tochukwu, MD .  atorvastatin (LIPITOR) tablet 10 mg, 10 mg, Oral, Daily, Agbata, Tochukwu, MD .  busPIRone (BUSPAR) tablet 10 mg, 10 mg, Oral, Daily, Agbata, Tochukwu, MD .  cholecalciferol (VITAMIN D3) tablet 1,000 Units, 1,000 Units, Oral, Daily, Agbata, Tochukwu, MD .  doxycycline (VIBRA-TABS) tablet 100 mg, 100 mg, Oral, Q12H, Agbata, Tochukwu, MD .  DULoxetine (CYMBALTA) DR capsule 120 mg, 120 mg, Oral, Daily, Agbata, Tochukwu, MD .  labetalol (NORMODYNE) injection 10 mg, 10 mg, Intravenous, Q6H PRN, Agbata, Tochukwu, MD .  levETIRAcetam (KEPPRA) IVPB 500 mg/100 mL premix, 500 mg, Intravenous, Q12H, Amie Portland, MD, Last Rate: 400 mL/hr at 06/22/20 0605, 500 mg at 06/22/20 0605 .  levothyroxine (SYNTHROID) tablet 100 mcg, 100 mcg, Oral, Q0600, Agbata, Tochukwu, MD .  multivitamin with minerals tablet, , Oral, Daily, Agbata, Tochukwu, MD .  pantoprazole (PROTONIX) EC tablet 40 mg, 40 mg, Oral, BID, Agbata, Tochukwu, MD .  senna-docusate (Senokot-S) tablet 1 tablet, 1 tablet, Oral, QHS PRN, Agbata, Tochukwu, MD Labs CBC    Component Value Date/Time   WBC 11.6 (H) 06/21/2020 1221   WBC 11.4 (H) 06/21/2020 1221   RBC 5.45 (H) 06/21/2020 1221   RBC 5.44 (H) 06/21/2020 1221   HGB 15.0 06/21/2020 1221   HGB 14.9 06/21/2020 1221   HCT 46.1 (  H) 06/21/2020 1221   HCT 46.4 (H) 06/21/2020 1221   PLT 295 06/21/2020 1221   PLT 297 06/21/2020 1221   MCV 84.6 06/21/2020 1221   MCV 85.3 06/21/2020 1221   MCH 27.5 06/21/2020 1221   MCH 27.4 06/21/2020 1221   MCHC 32.5 06/21/2020 1221   MCHC 32.1 06/21/2020 1221   RDW 15.0 06/21/2020 1221   RDW 15.3 06/21/2020 1221   LYMPHSABS 1.5 06/21/2020 1221   MONOABS 0.6 06/21/2020 1221   EOSABS 0.0 06/21/2020 1221   BASOSABS 0.0 06/21/2020  1221    CMP     Component Value Date/Time   NA 131 (L) 06/21/2020 1221   K 4.2 06/21/2020 1221   CL 93 (L) 06/21/2020 1221   CO2 25 06/21/2020 1221   GLUCOSE 161 (H) 06/21/2020 1221   BUN 17 06/21/2020 1221   CREATININE 0.73 06/21/2020 1221   CALCIUM 9.6 06/21/2020 1221   PROT 8.3 (H) 06/21/2020 1221   ALBUMIN 4.5 06/21/2020 1221   AST 27 06/21/2020 1221   ALT 22 06/21/2020 1221   ALKPHOS 29 (L) 06/21/2020 1221   BILITOT 0.8 06/21/2020 1221   GFRNONAA >60 06/21/2020 1221   GFRAA 58 (L) 04/09/2020 1156    glycosylated hemoglobin  Lipid Panel     Component Value Date/Time   CHOL 152 06/22/2020 0554   TRIG 80 06/22/2020 0554   HDL 55 06/22/2020 0554   CHOLHDL 2.8 06/22/2020 0554   VLDL 16 06/22/2020 0554   LDLCALC 81 06/22/2020 0554   TSH in September was 4.45  Imaging I have reviewed images in epic and the results pertinent to this consultation are: CT head no acute changes CTA head and neck with no emergent LVO.  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  Next imaging with no stenosis. MRI brain with no acute intracranial abnormality.  No stroke.  Generalized volume loss and findings of chronic small vessel ischemia.  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 She was aphasic on the exam yesterday but seems to be having some verbal output now. She has had a steady cognitive decline but was able to communicate well up until the day prior to presentation when she went to bed and woke up with the symptoms essentially. Upon further discussions with the family, she has been having some changes in her personality after her  stroke in October-daughter describes her as a rule follower who now could care less about things. Also, she has had transient global amnesia-this information was provided to me today-many years ago and daughter said that her symptoms lasted for 3 weeks. I doubt that this is transient global amnesia because her mentation is poor and her level of consciousness is also reduced.  I do not usually find level of consciousness alterations with TGA's. At this time, differentials include progression of dementia, toxic metabolic encephalopathy and I would also like to rule out any underlying seizure activity. She did present with some left-sided rhythmic arm movements while she was in the waiting area holding her daughter's hand-I am not fully convinced that that is a seizure but I would like to work it up at least with an EEG. I do not think  this is a stroke or TIA.  Impression: Multifactorial toxic metabolic encephalopathy Hyponatremia Possible side effect from medications such as Cymbalta & Buspar Evaluate for seizures  Recommendations: -EEG -Reduce Cymbalta-on 120 right now, can cut to half and also further in the next few days. -IV hydration -Correction of hyponatremia -Loaded with Keppra 1 g IV and started on Keppra 500 twice daily. -I would continue Keppra for now. -Maintain seizure precautions -I have ordered B12, ammonia, chest x-ray.  Chest x-ray is unremarkable.  Neurology will follow.  -- Amie Portland, MD [facility] Pager: [phone] If 7pm to 7am, please call on call as listed on [facility].

## 2020-06-22 NOTE — Progress Notes (Signed)
Diet order placed per conversation with MD and SLP recommendations

## 2020-06-22 NOTE — Progress Notes (Addendum)
PROGRESS NOTE    Jenny [name]  MHD:[id number] DOB: [birth year] DOA: 06/21/2020 PCP: Derinda Late, MD   Chief Complain: Altered mental status  Brief Narrative:  Patient is a 86 year old female with history of hypertension, GERD, depression, hyperlipidemia, A. fib, ischemic stroke in September 2021 with residual left lower extremity weakness who presents to the emergency department for the evaluation of sudden change  in the mental status at home.  She was recently seen in the emergency department for a fall and was discharged to a skilled nursing facility but then she was sent back to home.  On presentation, labs were reassuring.   Code stroke was called.  Neurology consulted.  MRI did not show any acute intracranial abnormalities.  CT angiogram head and neck did not show any large vessel occlusion.  Patient was admitted for the evaluation of altered mental status.  Work-up in progress.  Assessment & Plan:   Principal Problem:   AMS (altered mental status) Active Problems:   Atrial fibrillation, chronic (HCC)   Hypothyroid   Altered mental status: Likely metabolic encephalopathy but etiology remains obscure.  As per the daughter, she is alert and usually oriented on her normal days and is ambulatory..  On presentation, labs were reassuring.   Code stroke was called.  Neurology consulted.  MRI did not show any acute intracranial abnormalities.  CT angiogram head and neck did not show any large vessel occlusion but showed moderate to severely stenosis of right P2 PCA.  Patient was admitted for the evaluation of altered mental status.  Work-up in progress. Normal ammonia,pending vitamin B12.  UA not suggestive of UTI.  Chest x-ray did not show pneumonia. Delirium precautions, frequent reorientation, keep room bright with day sunlight. Speech therapy evaluation done,recommending NPO.  PT/OT evaluation. EEG showed cortical dysfunction in the left frontotemporal region, moderate diffuse  encephalopathy, no seizures or epileptiform discharges.  Currently on  Keppra 500 mg twice a day. Dose of Cymbalta reduced Continue gentle IV fluids  Paroxysmal A. fib: On Eliquis for anticoagulation.  Continue telemetry monitoring.  Currently heart rate well controlled.  Currently in normal sinus rhythm.  Hypothyroidism: Continue Synthyroid  History of nonhemorrhagic stroke: In September 2021.  Has left lower extremity weakness.  Takes Lipitor 10 mg daily.  Hypertension: Continue current medications.  Monitor blood pressure.  Continue as needed medications for severe hypertension  History of depression: Cymbalta to be continued on reduced dose  Hyponatremia: Mild.  Check BMP tomorrow  Goals of care: Patient has multiple comorbidities.  She was following with palliative care as an outpatient.  She has very poor oral intake due to her unresponsive status.  I will request for palliative care evaluation.  She might be a candidate for residential hospice/comfort care if no improvement in next 24 to 48 hours.  We will continue to discuss with family.               DVT prophylaxis: eliquis Code Status: Full Family Communication: daughter at bedside Status is: Inpatient  Remains inpatient appropriate because:Inpatient level of care appropriate due to severity of illness   Dispo: The patient is from: Home              Anticipated d/c is to: SNF vs residential hopsice              Anticipated d/c date is: 2 days              Patient currently is not medically stable to d/c.  Consultants: Neurology  Procedures: None  Antimicrobials:  Anti-infectives (From admission, onward)   Start     Dose/Rate Route Frequency Ordered Stop   06/21/20 2200  doxycycline (VIBRA-TABS) tablet 100 mg        100 mg Oral Every 12 hours 06/21/20 1812        Subjective: Patient seen and examined at the bedside this morning.  Hemodynamically stable but she was completely obtunded, unresponsive.   Eyes closed.  She was not in any kind of stress distress.  Daughter at the bedside  Objective: Vitals:   06/22/20 0302 06/22/20 0419 06/22/20 0558 06/22/20 0800  BP:  (!) 151/122 (!) 145/84 (!) 161/89  Pulse:  91 75 84  Resp:  18 18 16   Temp:  (!) 97.3 F (36.3 C) 97.8 F (36.6 C) 99.1 F (37.3 C)  TempSrc:   Oral Oral  SpO2:  94% 95%   Weight: 83.3 kg     Height:        Intake/Output Summary (Last 24 hours) at 06/22/2020 0840 Last data filed at 06/21/2020 1831 Gross per 24 hour  Intake 600 ml  Output --  Net 600 ml   Filed Weights   06/22/20 0302  Weight: 83.3 kg    Examination:  General exam: Unresponsive HEENT: Eyes closed, pupils normal Respiratory system: Bilateral equal air entry, normal vesicular breath sounds, no wheezes or crackles  Cardiovascular system: S1 & S2 heard, RRR. No JVD, murmurs, rubs, gallops or clicks. No pedal edema. Gastrointestinal system: Abdomen is nondistended, soft and nontender. No organomegaly or masses felt. Normal bowel sounds heard. Central nervous system: Not alert and oriented.. Extremities: No edema, no clubbing ,no cyanosis Skin: No rashes, lesions or ulcers,no icterus ,no pallor  Data Reviewed: I have personally reviewed following labs and imaging studies  CBC: Recent Labs  Lab 06/15/20 0947 06/21/20 1221  WBC 9.8 11.4*  11.6*  NEUTROABS  --  9.2*  HGB 14.8 14.9  15.0  HCT 46.2* 46.4*  46.1*  MCV 86.7 85.3  84.6  PLT 271 297  476   Basic Metabolic Panel: Recent Labs  Lab 06/21/20 1221 06/21/20 1604  NA 131*  --   K 4.2  --   CL 93*  --   CO2 25  --   GLUCOSE 161*  --   BUN 17  --   CREATININE 0.73  --   CALCIUM 9.6  --   MG  --  1.9   GFR: Estimated Creatinine Clearance: 51.9 mL/min (by C-G formula based on SCr of 0.73 mg/dL). Liver Function Tests: Recent Labs  Lab 06/21/20 1221  AST 27  ALT 22  ALKPHOS 29*  BILITOT 0.8  PROT 8.3*  ALBUMIN 4.5   No results for input(s): LIPASE, AMYLASE in  the last 168 hours. No results for input(s): AMMONIA in the last 168 hours. Coagulation Profile: Recent Labs  Lab 06/21/20 1604  INR 1.1   Cardiac Enzymes: No results for input(s): CKTOTAL, CKMB, CKMBINDEX, TROPONINI in the last 168 hours. BNP (last 3 results) No results for input(s): PROBNP in the last 8760 hours. HbA1C: No results for input(s): HGBA1C in the last 72 hours. CBG: Recent Labs  Lab 06/21/20 1637  GLUCAP 175*   Lipid Profile: Recent Labs    06/22/20 0554  CHOL 152  HDL 55  LDLCALC 81  TRIG 80  CHOLHDL 2.8   Thyroid Function Tests: No results for input(s): TSH, T4TOTAL, FREET4, T3FREE, THYROIDAB in the last 72 hours. Anemia  Panel: No results for input(s): VITAMINB12, FOLATE, FERRITIN, TIBC, IRON, RETICCTPCT in the last 72 hours. Sepsis Labs: No results for input(s): PROCALCITON, LATICACIDVEN in the last 168 hours.  Recent Results (from the past 240 hour(s))  Resp Panel by RT-PCR (Flu A&B, Covid) Nasopharyngeal Swab     Status: None   Collection Time: 06/14/20 11:34 AM   Specimen: Nasopharyngeal Swab; Nasopharyngeal(NP) swabs in vial transport medium  Result Value Ref Range Status   SARS Coronavirus 2 by RT PCR NEGATIVE NEGATIVE Final    Comment: (NOTE) SARS-CoV-2 target nucleic acids are NOT DETECTED.  The SARS-CoV-2 RNA is generally detectable in upper respiratory specimens during the acute phase of infection. The lowest concentration of SARS-CoV-2 viral copies this assay can detect is 138 copies/mL. A negative result does not preclude SARS-Cov-2 infection and should not be used as the sole basis for treatment or other patient management decisions. A negative result may occur with  improper specimen collection/handling, submission of specimen other than nasopharyngeal swab, presence of viral mutation(s) within the areas targeted by this assay, and inadequate number of viral copies(<138 copies/mL). A negative result must be combined with clinical  observations, patient history, and epidemiological information. The expected result is Negative.  Fact Sheet for Patients:  EntrepreneurPulse.com.au  Fact Sheet for Healthcare Providers:  IncredibleEmployment.be  This test is no t yet approved or cleared by the [place] FDA and  has been authorized for detection and/or diagnosis of SARS-CoV-2 by FDA under an Emergency Use Authorization (EUA). This EUA will remain  in effect (meaning this test can be used) for the duration of the COVID-19 declaration under Section 564(b)(1) of the Act, 21 U.S.C.section 360bbb-3(b)(1), unless the authorization is terminated  or revoked sooner.       Influenza A by PCR NEGATIVE NEGATIVE Final   Influenza B by PCR NEGATIVE NEGATIVE Final    Comment: (NOTE) The Xpert Xpress SARS-CoV-2/FLU/RSV plus assay is intended as an aid in the diagnosis of influenza from Nasopharyngeal swab specimens and should not be used as a sole basis for treatment. Nasal washings and aspirates are unacceptable for Xpert Xpress SARS-CoV-2/FLU/RSV testing.  Fact Sheet for Patients: EntrepreneurPulse.com.au  Fact Sheet for Healthcare Providers: IncredibleEmployment.be  This test is not yet approved or cleared by the [place] FDA and has been authorized for detection and/or diagnosis of SARS-CoV-2 by FDA under an Emergency Use Authorization (EUA). This EUA will remain in effect (meaning this test can be used) for the duration of the COVID-19 declaration under Section 564(b)(1) of the Act, 21 U.S.C. section 360bbb-3(b)(1), unless the authorization is terminated or revoked.  Performed at [facility], [address] [place], [place] [address]   Resp Panel by RT-PCR (Flu A&B, Covid) Nasopharyngeal Swab     Status: None   Collection Time: 06/21/20  4:46 PM   Specimen: Nasopharyngeal Swab; Nasopharyngeal(NP) swabs in vial transport  medium  Result Value Ref Range Status   SARS Coronavirus 2 by RT PCR NEGATIVE NEGATIVE Final    Comment: (NOTE) SARS-CoV-2 target nucleic acids are NOT DETECTED.  The SARS-CoV-2 RNA is generally detectable in upper respiratory specimens during the acute phase of infection. The lowest concentration of SARS-CoV-2 viral copies this assay can detect is 138 copies/mL. A negative result does not preclude SARS-Cov-2 infection and should not be used as the sole basis for treatment or other patient management decisions. A negative result may occur with  improper specimen collection/handling, submission of specimen other than nasopharyngeal swab, presence of  viral mutation(s) within the areas targeted by this assay, and inadequate number of viral copies(<138 copies/mL). A negative result must be combined with clinical observations, patient history, and epidemiological information. The expected result is Negative.  Fact Sheet for Patients:  EntrepreneurPulse.com.au  Fact Sheet for Healthcare Providers:  IncredibleEmployment.be  This test is no t yet approved or cleared by the [place] FDA and  has been authorized for detection and/or diagnosis of SARS-CoV-2 by FDA under an Emergency Use Authorization (EUA). This EUA will remain  in effect (meaning this test can be used) for the duration of the COVID-19 declaration under Section 564(b)(1) of the Act, 21 U.S.C.section 360bbb-3(b)(1), unless the authorization is terminated  or revoked sooner.       Influenza A by PCR NEGATIVE NEGATIVE Final   Influenza B by PCR NEGATIVE NEGATIVE Final    Comment: (NOTE) The Xpert Xpress SARS-CoV-2/FLU/RSV plus assay is intended as an aid in the diagnosis of influenza from Nasopharyngeal swab specimens and should not be used as a sole basis for treatment. Nasal washings and aspirates are unacceptable for Xpert Xpress SARS-CoV-2/FLU/RSV testing.  Fact Sheet for  Patients: EntrepreneurPulse.com.au  Fact Sheet for Healthcare Providers: IncredibleEmployment.be  This test is not yet approved or cleared by the [place] FDA and has been authorized for detection and/or diagnosis of SARS-CoV-2 by FDA under an Emergency Use Authorization (EUA). This EUA will remain in effect (meaning this test can be used) for the duration of the COVID-19 declaration under Section 564(b)(1) of the Act, 21 U.S.C. section 360bbb-3(b)(1), unless the authorization is terminated or revoked.  Performed at [facility], [address] [place], [place] [address]          Radiology Studies: CT Code Stroke CTA Head W/WO contrast  Result Date: 06/21/2020 CLINICAL DATA:  Stroke follow-up. EXAM: CT ANGIOGRAPHY HEAD AND NECK TECHNIQUE: Multidetector CT imaging of the head and neck was performed using the standard protocol during bolus administration of intravenous contrast. Multiplanar CT image reconstructions and MIPs were obtained to evaluate the vascular anatomy. Carotid stenosis measurements (when applicable) are obtained utilizing NASCET criteria, using the distal internal carotid diameter as the denominator. CONTRAST:  166mL OMNIPAQUE IOHEXOL 350 MG/ML SOLN COMPARISON:  Same day head CT. FINDINGS: CTA NECK FINDINGS Aortic arch: Great vessel origins are patent. Calcific atherosclerosis. Right carotid system: No evidence of dissection, stenosis (50% or greater) or occlusion. Mild atherosclerosis at the bifurcation. Left carotid system: No evidence of dissection, stenosis (50% or greater) or occlusion. Mild atherosclerosis at the bifurcation. Vertebral arteries: Codominant. No evidence of dissection, stenosis (50% or greater) or occlusion. Mild multifocal narrowing of the right vertebral artery, likely related to atherosclerosis. Skeleton: No acute fracture. Other neck: No mass or suspicious adenopathy. Upper chest: Negative. Review  of the MIP images confirms the above findings CTA HEAD FINDINGS Anterior circulation: No significant stenosis, proximal occlusion, aneurysm, or vascular malformation. 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 Moderate to severe stenosis of the right P2 PCA with distal opacification. 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 Mild left P2 PCA stenosis. Venous sinuses: Poorly visualized given arterial timing. Anatomic variants: Hypoplastic left P1 PCA with fetal type left PCA. Review of the MIP images confirms the above findings IMPRESSION: 1. No convincing large vessel occlusion. 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 2. Moderate to severe stenosis of the right P2 PCA  and moderate stenosis of the basilar artery. 3. No evidence of hemodynamically significant stenosis in the neck. Findings discussed with Dr. Rory Percy  at 4:50 p.m. via telephone. Electronically Signed   By: Margaretha Sheffield MD   On: 06/21/2020 17:01   CT Code Stroke CTA Neck W/WO contrast  Result Date: 06/21/2020 CLINICAL DATA:  Stroke follow-up. EXAM: CT ANGIOGRAPHY HEAD AND NECK TECHNIQUE: Multidetector CT imaging of the head and neck was performed using the standard protocol during bolus administration of intravenous contrast. Multiplanar CT image reconstructions and MIPs were obtained to evaluate the vascular anatomy. Carotid stenosis measurements (when applicable) are obtained utilizing NASCET criteria, using the distal internal carotid diameter as the denominator. CONTRAST:  130mL OMNIPAQUE IOHEXOL 350 MG/ML SOLN COMPARISON:  Same day head CT. FINDINGS: CTA NECK FINDINGS Aortic arch: Great vessel origins are patent. Calcific atherosclerosis. Right carotid system: No evidence of dissection, stenosis (50% or greater) or occlusion. Mild atherosclerosis at the bifurcation. Left carotid system: No evidence of dissection, stenosis  (50% or greater) or occlusion. Mild atherosclerosis at the bifurcation. Vertebral arteries: Codominant. No evidence of dissection, stenosis (50% or greater) or occlusion. Mild multifocal narrowing of the right vertebral artery, likely related to atherosclerosis. Skeleton: No acute fracture. Other neck: No mass or suspicious adenopathy. Upper chest: Negative. Review of the MIP images confirms the above findings CTA HEAD FINDINGS Anterior circulation: No significant stenosis, proximal occlusion, aneurysm, or vascular malformation. 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 Moderate to severe stenosis of the right P2 PCA with distal opacification. 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 Mild left P2 PCA stenosis. Venous sinuses: Poorly visualized given arterial timing. Anatomic variants: Hypoplastic left P1 PCA with fetal type left PCA. Review of the MIP images confirms the above findings IMPRESSION: 1. No convincing large vessel occlusion. 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 2. Moderate to severe stenosis of the right P2 PCA and moderate stenosis of the basilar artery. 3. No evidence of hemodynamically significant stenosis in the neck. Findings discussed with Dr. Rory Percy  at 4:50 p.m. via telephone. Electronically Signed   By: Margaretha Sheffield MD   On: 06/21/2020 17:01   MR BRAIN WO CONTRAST  Result Date: 06/22/2020 CLINICAL DATA:  Encephalopathy EXAM: MRI HEAD WITHOUT CONTRAST TECHNIQUE: Multiplanar, multiecho pulse sequences of the brain and surrounding structures were obtained without intravenous contrast. COMPARISON:  None. FINDINGS: Brain: No acute infarct, mass effect or extra-axial collection. No acute or chronic hemorrhage. Hyperintense T2-weighted signal is moderately widespread throughout the white matter. Generalized volume loss without a clear lobar predilection. The midline  structures are normal. Vascular: Major flow voids are preserved. Skull and upper cervical spine: Normal calvarium and skull base. Visualized upper cervical spine and soft tissues are normal. Sinuses/Orbits:No paranasal sinus fluid levels or advanced mucosal thickening. No mastoid or middle ear effusion. Normal orbits. IMPRESSION: 1. No acute intracranial abnormality. 2. Generalized volume loss and findings of chronic small vessel ischemia. Electronically Signed   By: Ulyses Jarred M.D.   On: 06/22/2020 02:15   CT Code Stroke Cerebral Perfusion with contrast  Result Date: 06/21/2020 CLINICAL DATA:  Stroke follow-up. EXAM: CT PERFUSION BRAIN TECHNIQUE: Multiphase CT imaging of the brain was performed following IV bolus contrast injection. Subsequent parametric perfusion maps were calculated using RAPID software. CONTRAST:  132mL OMNIPAQUE IOHEXOL 350 MG/ML SOLN COMPARISON:  Same day CT imaging FINDINGS: CT  Brain Perfusion Findings: CBF (<30%) Volume: 78mL Perfusion (Tmax>6.0s) volume: 47mL Mismatch Volume: 32mL ASPECTS on noncontrast CT Head: 10. Infarct Core: 0 mL Areas of reported perfusion abnormality in bilateral frontal lobes are favored to reflect artifact given they do not correlate with a vascular territory and given artifact in this region. IMPRESSION: No convincing evidence of perfusion abnormality. Small areas of reported perfusion abnormality are favored to reflect artifact given they do not correlate with a vascular territory and given artifact in this region. Findings discussed with Dr. Rory Percy  at 4:50 p.m. via telephone. Electronically Signed   By: Margaretha Sheffield MD   On: 06/21/2020 17:04   CT HEAD CODE STROKE WO CONTRAST  Result Date: 06/21/2020 CLINICAL DATA:  Code stroke. Neuro deficit, acute stroke suspected. Altered mental status. EXAM: CT HEAD WITHOUT CONTRAST TECHNIQUE: Contiguous axial images were obtained from the base of the skull through the vertex without intravenous contrast.  COMPARISON:  CT head June 11, 2020.  MRI June 12, 2020. FINDINGS: Brain: No evidence of acute large vascular territory infarction, hemorrhage, hydrocephalus, extra-axial collection or mass lesion/mass effect. Remote infarct involving the ventral right thalamus. Moderate patchy white matter hypoattenuation, most likely related to chronic microvascular ischemic disease. Vascular: Calcific atherosclerosis. No hyperdense vessel identified. Skull: No acute fracture. Sinuses/Orbits: Visualized sinuses are clear.  Unremarkable orbits. Other: No mastoid effusions. ASPECTS [facility] Stroke Program Early CT Score) Total score (0-10 with 10 being normal): 10. IMPRESSION: 1. No evidence of acute intracranial abnormality.  ASPECTS is 10. 2. Remote right thalamic infarct and microvascular ischemic disease. Code stroke imaging results were communicated on 06/21/2020 at 4:27 pm to provider Dr. Rory Percy via secure text paging. Electronically Signed   By: Margaretha Sheffield MD   On: 06/21/2020 16:28        Scheduled Meds: .  stroke: mapping our early stages of recovery book   Does not apply Once  . apixaban  5 mg Oral BID  . atorvastatin  10 mg Oral Daily  . busPIRone  10 mg Oral Daily  . cholecalciferol  1,000 Units Oral Daily  . doxycycline  100 mg Oral Q12H  . DULoxetine  120 mg Oral Daily  . levothyroxine  100 mcg Oral Q0600  . multivitamin with minerals   Oral Daily  . pantoprazole  40 mg Oral BID   Continuous Infusions: . levETIRAcetam 500 mg (06/22/20 0605)     LOS: 1 day    Time spent: 35 mins.More than 50% of that time was spent in counseling and/or coordination of care.      Shelly Coss, MD [facility] P12/17/2021, 8:40 AM

## 2020-06-22 NOTE — Progress Notes (Signed)
eeg done °

## 2020-06-22 NOTE — Progress Notes (Signed)
PT Cancellation Note  Patient Details Name: Jenny [name] MRN: [id number] DOB: 10/08/1927   Cancelled Treatment:    Reason Eval/Treat Not Completed: Fatigue/lethargy limiting ability to participate;Patient's level of consciousness (Chart reviewed, eval attempted; pt remains largely somnolent, as per previous day. DTR reports no significant improvement.) Family reports desire to care for patient in home at DC, desire to avoid facility placement at this time. PT supportive of this at this time. Pt's husband has been bed bound in home for >1 year. DTR reports they would need a hospital bed to care for patient at home. Will coordinate with TOC team. Unable to evaluate at this time due to continued somnolence, encephalopathy. Will attempt again at later date/time as pt is able to participate.   10:30 AM, 06/22/20 Etta Grandchild, PT, DPT Physical Therapist - [facility]  [phone] ([place])    Shuan Statzer C 06/22/2020, 10:28 AM

## 2020-06-23 LAB — BASIC METABOLIC PANEL
Anion gap: 10 (ref 5–15)
BUN: 20 mg/dL (ref 8–23)
CO2: 27 mmol/L (ref 22–32)
Calcium: 8.7 mg/dL — ABNORMAL LOW (ref 8.9–10.3)
Chloride: 100 mmol/L (ref 98–111)
Creatinine, Ser: 0.78 mg/dL (ref 0.44–1.00)
GFR, Estimated: >60 mL/min (ref >60)
Glucose, Bld: 123 mg/dL — ABNORMAL HIGH (ref 70–99)
Potassium: 4 mmol/L (ref 3.5–5.1)
Sodium: 137 mmol/L (ref 135–145)

## 2020-06-23 LAB — CBC WITH DIFFERENTIAL/PLATELET
Abs Immature Granulocytes: 0.03 10*3/uL (ref 0.00–0.07)
Basophils Absolute: 0.1 10*3/uL (ref 0.0–0.1)
Basophils Relative: 1 %
Eosinophils Absolute: 0.2 10*3/uL (ref 0.0–0.5)
Eosinophils Relative: 3 %
HCT: 44.2 % (ref 36.0–46.0)
Hemoglobin: 14.2 g/dL (ref 12.0–15.0)
Immature Granulocytes: 0 %
Lymphocytes Relative: 26 %
Lymphs Abs: 2.2 10*3/uL (ref 0.7–4.0)
MCH: 27.8 pg (ref 26.0–34.0)
MCHC: 32.1 g/dL (ref 30.0–36.0)
MCV: 86.7 fL (ref 80.0–100.0)
Monocytes Absolute: 0.9 10*3/uL (ref 0.1–1.0)
Monocytes Relative: 11 %
Neutro Abs: 5.2 10*3/uL (ref 1.7–7.7)
Neutrophils Relative %: 59 %
Platelets: 247 10*3/uL (ref 150–400)
RBC: 5.1 MIL/uL (ref 3.87–5.11)
RDW: 15.2 % (ref 11.5–15.5)
WBC: 8.6 10*3/uL (ref 4.0–10.5)
nRBC: 0 % (ref 0.0–0.2)

## 2020-06-23 MED ORDER — BUTALBITAL-APAP-CAFFEINE 50-325-40 MG PO TABS
2.0000 | ORAL_TABLET | Freq: Four times a day (QID) | ORAL | Status: DC | PRN
  Administered 2020-06-24 – 2020-06-25 (×2): 2 via ORAL
  Filled 2020-06-23 (×2): qty 2

## 2020-06-23 MED ORDER — LEVOTHYROXINE SODIUM 50 MCG PO TABS
100.0000 ug | ORAL_TABLET | Freq: Every day | ORAL | Status: DC
  Administered 2020-06-24 – 2020-06-25 (×2): 100 ug via ORAL
  Filled 2020-06-23 (×2): qty 2

## 2020-06-23 NOTE — Progress Notes (Signed)
OT Cancellation Note  Patient Details Name: Jenny [name] MRN: [id number] DOB: October 03, 1927   Cancelled Treatment:    Reason Eval/Treat Not Completed: Other (comment)  OT order cancelled by MD. Will sign off at this time. Thank you.  Gerrianne Scale, [place], OTR/L ascom [phone] 06/23/20, 2:57 PM

## 2020-06-23 NOTE — Progress Notes (Signed)
PROGRESS NOTE    Jenny [name]  TOI:[id number] DOB: [birth year] DOA: 06/21/2020 PCP: Derinda Late, MD   Chief Complain: Altered mental status  Brief Narrative:  Patient is a 86 year old female with history of hypertension, GERD, depression, hyperlipidemia, A. fib, ischemic stroke in September 2021 with residual left lower extremity weakness who presents to the emergency department for the evaluation of sudden change  in the mental status at home.  She was recently seen in the emergency department for a fall and was discharged to a skilled nursing facility but then she was sent back to home.  On presentation, labs were reassuring.   Code stroke was called.  Neurology consulted.  MRI did not show any acute intracranial abnormalities.  CT angiogram head and neck did not show any large vessel occlusion.  Patient was admitted for the evaluation of altered mental status.   Mental status has significantly improved today and she is alert on awake but still confused.  Daughter is interested on setting up hospice at home.  Plan for discharge tomorrow to home with hospice.  Assessment & Plan:   Principal Problem:   AMS (altered mental status) Active Problems:   Atrial fibrillation, chronic (HCC)   Hypothyroid   Altered mental status: Likely metabolic encephalopathy but etiology remains obscure.  As per the daughter, she is alert and usually oriented on her good days and is ambulatory but has memory deficits.  On presentation, labs were reassuring.   Code stroke was called.  Neurology consulted.  MRI did not show any acute intracranial abnormalities.  CT angiogram head and neck did not show any large vessel occlusion but showed moderate to severely stenosis of right P2 PCA.  Patient was admitted for the evaluation of altered mental status. Normal ammonia, vitamin B12.  UA not suggestive of UTI.  Chest x-ray did not show pneumonia. Delirium precautions, frequent reorientation, keep room bright with  day sunlight. EEG showed cortical dysfunction in the left frontotemporal region, moderate diffuse encephalopathy, no seizures or epileptiform discharges.  Currently on  Keppra 500 mg twice a day. Dose of Cymbalta reduced Mental status has improved now.she is alert on awake but still confused.    Paroxysmal A. fib: On Eliquis for anticoagulation.  Continue telemetry monitoring.  Currently heart rate well controlled.  Currently in normal sinus rhythm.  Hypothyroidism: Continue Synthyroid  History of nonhemorrhagic stroke: In September 2021.  Has left lower extremity weakness.  Takes Lipitor 10 mg daily.  Hypertension: Continue current medications.  Monitor blood pressure.  Continue as needed medications for severe hypertension  History of depression: Cymbalta to be continued on reduced dose  Hyponatremia: Resolved  Goals of care: Patient has multiple comorbidities.  She was following with palliative care as an outpatient.  Daughter is now interested on setting up hospice at home.  She is interested to take her mom to home whenever possible.  I have put TOC consult for management of home hospice            DVT prophylaxis: eliquis Code Status: Full Family Communication: daughter at bedside Status is: Inpatient  Remains inpatient appropriate because:Inpatient level of care appropriate due to severity of illness   Dispo: The patient is from: Home              Anticipated d/c is to: Home with hospice tomorrow              Anticipated d/c date is: 1 day       Consultants:  Neurology  Procedures: None  Antimicrobials:  Anti-infectives (From admission, onward)   Start     Dose/Rate Route Frequency Ordered Stop   06/21/20 2200  doxycycline (VIBRA-TABS) tablet 100 mg  Status:  Discontinued        100 mg Oral Every 12 hours 06/21/20 1812 06/22/20 1257      Subjective: Patient seen and examined the bedside this morning.  Hemodynamically stable.  Comfortable during my  evaluation.  Alert, awake and communicates and answered most of the questions but not oriented to time.  Not in any kind of distress.  Tolerating diet  Objective: Vitals:   06/22/20 2107 06/22/20 2321 06/23/20 0527 06/23/20 0805  BP: (!) 154/86 (!) 145/94 (!) 141/92 (!) 161/91  Pulse: 69 61 81 65  Resp: 17 17 19 17   Temp: (!) 97.5 F (36.4 C) 97.7 F (36.5 C) 98 F (36.7 C) 98.9 F (37.2 C)  TempSrc:    Oral  SpO2: 96% 96% 96% 95%  Weight:      Height:        Intake/Output Summary (Last 24 hours) at 06/23/2020 0806 Last data filed at 06/23/2020 0541 Gross per 24 hour  Intake 200 ml  Output 500 ml  Net -300 ml   Filed Weights   06/22/20 0302  Weight: 83.3 kg    Examination:  General exam: Comfortable not in any kind of distress Respiratory system: Bilateral equal air entry, normal vesicular breath sounds, no wheezes or crackles  Cardiovascular system: S1 & S2 heard, RRR. No JVD, murmurs, rubs, gallops or clicks. Gastrointestinal system: Abdomen is nondistended, soft and nontender. No organomegaly or masses felt. Normal bowel sounds heard. Central nervous system: Alert and awake but confused  extremities: No edema, no clubbing ,no cyanosis Skin: No rashes, lesions or ulcers,no icterus ,no pallor   Data Reviewed: I have personally reviewed following labs and imaging studies  CBC: Recent Labs  Lab 06/21/20 1221 06/23/20 0547  WBC 11.4*  11.6* 8.6  NEUTROABS 9.2* 5.2  HGB 14.9  15.0 14.2  HCT 46.4*  46.1* 44.2  MCV 85.3  84.6 86.7  PLT 297  295 633   Basic Metabolic Panel: Recent Labs  Lab 06/21/20 1221 06/21/20 1604 06/23/20 0547  NA 131*  --  137  K 4.2  --  4.0  CL 93*  --  100  CO2 25  --  27  GLUCOSE 161*  --  123*  BUN 17  --  20  CREATININE 0.73  --  0.78  CALCIUM 9.6  --  8.7*  MG  --  1.9  --    GFR: Estimated Creatinine Clearance: 51.9 mL/min (by C-G formula based on SCr of 0.78 mg/dL). Liver Function Tests: Recent Labs  Lab  06/21/20 1221  AST 27  ALT 22  ALKPHOS 29*  BILITOT 0.8  PROT 8.3*  ALBUMIN 4.5   No results for input(s): LIPASE, AMYLASE in the last 168 hours. Recent Labs  Lab 06/22/20 1035  AMMONIA 10   Coagulation Profile: Recent Labs  Lab 06/21/20 1604  INR 1.1   Cardiac Enzymes: No results for input(s): CKTOTAL, CKMB, CKMBINDEX, TROPONINI in the last 168 hours. BNP (last 3 results) No results for input(s): PROBNP in the last 8760 hours. HbA1C: Recent Labs    06/22/20 0554  HGBA1C 6.6*   CBG: Recent Labs  Lab 06/21/20 1637  GLUCAP 175*   Lipid Profile: Recent Labs    06/22/20 0554  CHOL 152  HDL 55  LDLCALC 81  TRIG 80  CHOLHDL 2.8   Thyroid Function Tests: No results for input(s): TSH, T4TOTAL, FREET4, T3FREE, THYROIDAB in the last 72 hours. Anemia Panel: Recent Labs    06/22/20 0855  VITAMINB12 397   Sepsis Labs: No results for input(s): PROCALCITON, LATICACIDVEN in the last 168 hours.  Recent Results (from the past 240 hour(s))  Resp Panel by RT-PCR (Flu A&B, Covid) Nasopharyngeal Swab     Status: None   Collection Time: 06/14/20 11:34 AM   Specimen: Nasopharyngeal Swab; Nasopharyngeal(NP) swabs in vial transport medium  Result Value Ref Range Status   SARS Coronavirus 2 by RT PCR NEGATIVE NEGATIVE Final    Comment: (NOTE) SARS-CoV-2 target nucleic acids are NOT DETECTED.  The SARS-CoV-2 RNA is generally detectable in upper respiratory specimens during the acute phase of infection. The lowest concentration of SARS-CoV-2 viral copies this assay can detect is 138 copies/mL. A negative result does not preclude SARS-Cov-2 infection and should not be used as the sole basis for treatment or other patient management decisions. A negative result may occur with  improper specimen collection/handling, submission of specimen other than nasopharyngeal swab, presence of viral mutation(s) within the areas targeted by this assay, and inadequate number of  viral copies(<138 copies/mL). A negative result must be combined with clinical observations, patient history, and epidemiological information. The expected result is Negative.  Fact Sheet for Patients:  EntrepreneurPulse.com.au  Fact Sheet for Healthcare Providers:  IncredibleEmployment.be  This test is no t yet approved or cleared by the [place] FDA and  has been authorized for detection and/or diagnosis of SARS-CoV-2 by FDA under an Emergency Use Authorization (EUA). This EUA will remain  in effect (meaning this test can be used) for the duration of the COVID-19 declaration under Section 564(b)(1) of the Act, 21 U.S.C.section 360bbb-3(b)(1), unless the authorization is terminated  or revoked sooner.       Influenza A by PCR NEGATIVE NEGATIVE Final   Influenza B by PCR NEGATIVE NEGATIVE Final    Comment: (NOTE) The Xpert Xpress SARS-CoV-2/FLU/RSV plus assay is intended as an aid in the diagnosis of influenza from Nasopharyngeal swab specimens and should not be used as a sole basis for treatment. Nasal washings and aspirates are unacceptable for Xpert Xpress SARS-CoV-2/FLU/RSV testing.  Fact Sheet for Patients: EntrepreneurPulse.com.au  Fact Sheet for Healthcare Providers: IncredibleEmployment.be  This test is not yet approved or cleared by the [place] FDA and has been authorized for detection and/or diagnosis of SARS-CoV-2 by FDA under an Emergency Use Authorization (EUA). This EUA will remain in effect (meaning this test can be used) for the duration of the COVID-19 declaration under Section 564(b)(1) of the Act, 21 U.S.C. section 360bbb-3(b)(1), unless the authorization is terminated or revoked.  Performed at [facility], [address] [place], [place] [address]   Resp Panel by RT-PCR (Flu A&B, Covid) Nasopharyngeal Swab     Status: None   Collection Time: 06/21/20  4:46  PM   Specimen: Nasopharyngeal Swab; Nasopharyngeal(NP) swabs in vial transport medium  Result Value Ref Range Status   SARS Coronavirus 2 by RT PCR NEGATIVE NEGATIVE Final    Comment: (NOTE) SARS-CoV-2 target nucleic acids are NOT DETECTED.  The SARS-CoV-2 RNA is generally detectable in upper respiratory specimens during the acute phase of infection. The lowest concentration of SARS-CoV-2 viral copies this assay can detect is 138 copies/mL. A negative result does not preclude SARS-Cov-2 infection and should not be used as the sole basis for  treatment or other patient management decisions. A negative result may occur with  improper specimen collection/handling, submission of specimen other than nasopharyngeal swab, presence of viral mutation(s) within the areas targeted by this assay, and inadequate number of viral copies(<138 copies/mL). A negative result must be combined with clinical observations, patient history, and epidemiological information. The expected result is Negative.  Fact Sheet for Patients:  EntrepreneurPulse.com.au  Fact Sheet for Healthcare Providers:  IncredibleEmployment.be  This test is no t yet approved or cleared by the [place] FDA and  has been authorized for detection and/or diagnosis of SARS-CoV-2 by FDA under an Emergency Use Authorization (EUA). This EUA will remain  in effect (meaning this test can be used) for the duration of the COVID-19 declaration under Section 564(b)(1) of the Act, 21 U.S.C.section 360bbb-3(b)(1), unless the authorization is terminated  or revoked sooner.       Influenza A by PCR NEGATIVE NEGATIVE Final   Influenza B by PCR NEGATIVE NEGATIVE Final    Comment: (NOTE) The Xpert Xpress SARS-CoV-2/FLU/RSV plus assay is intended as an aid in the diagnosis of influenza from Nasopharyngeal swab specimens and should not be used as a sole basis for treatment. Nasal washings and aspirates are  unacceptable for Xpert Xpress SARS-CoV-2/FLU/RSV testing.  Fact Sheet for Patients: EntrepreneurPulse.com.au  Fact Sheet for Healthcare Providers: IncredibleEmployment.be  This test is not yet approved or cleared by the [place] FDA and has been authorized for detection and/or diagnosis of SARS-CoV-2 by FDA under an Emergency Use Authorization (EUA). This EUA will remain in effect (meaning this test can be used) for the duration of the COVID-19 declaration under Section 564(b)(1) of the Act, 21 U.S.C. section 360bbb-3(b)(1), unless the authorization is terminated or revoked.  Performed at [facility], [address], [place], [place] [address]          Radiology Studies: EEG  Result Date: 06/22/2020 Lora Havens, MD     06/22/2020  2:24 PM Patient Name: Jenny [name] MRN: [id number] Epilepsy Attending: Lora Havens Referring Physician/Provider: Dr Collier Bullock Date: 06/22/2020 Duration: 39.18 mins Patient history: 86 year old woman with past medical history of paroxysmal atrial fibrillation on anticoagulation with apixaban, recent right thalamic capsular stroke and to left frontal strokes with no residual deficits, coming from home with complaints of altered mental status and no verbal output. EEG to evaluate for seizure. Level of alertness: Awake AEDs during EEG study: LEV Technical aspects: This EEG study was done with scalp electrodes positioned according to the 10-20 International system of electrode placement. Electrical activity was acquired at a sampling rate of 500Hz  and reviewed with a high frequency filter of 70Hz  and a low frequency filter of 1Hz . EEG data were recorded continuously and digitally stored. Description: EEG showed continuous generalized and maximal left frontotemporal region 3 to 6 Hz theta-delta slowing. Hyperventilation and photic stimulation were not performed.   ABNORMALITY -Continuous slow,  generalized and maximal left  frontotemporal region IMPRESSION: This study is suggestive of cortical dysfunction in left frontotemporal region secondary to underlying structural abnormality, post-ictal state. Additionally there is moderate diffuse encephalopathy, nonspecific etiology. No seizures or epileptiform discharges were seen throughout the recording. Lora Havens   CT Code Stroke CTA Head W/WO contrast  Result Date: 06/21/2020 CLINICAL DATA:  Stroke follow-up. EXAM: CT ANGIOGRAPHY HEAD AND NECK TECHNIQUE: Multidetector CT imaging of the head and neck was performed using the standard protocol during bolus administration of intravenous contrast. Multiplanar CT image reconstructions and MIPs were  obtained to evaluate the vascular anatomy. Carotid stenosis measurements (when applicable) are obtained utilizing NASCET criteria, using the distal internal carotid diameter as the denominator. CONTRAST:  121mL OMNIPAQUE IOHEXOL 350 MG/ML SOLN COMPARISON:  Same day head CT. FINDINGS: CTA NECK FINDINGS Aortic arch: Great vessel origins are patent. Calcific atherosclerosis. Right carotid system: No evidence of dissection, stenosis (50% or greater) or occlusion. Mild atherosclerosis at the bifurcation. Left carotid system: No evidence of dissection, stenosis (50% or greater) or occlusion. Mild atherosclerosis at the bifurcation. Vertebral arteries: Codominant. No evidence of dissection, stenosis (50% or greater) or occlusion. Mild multifocal narrowing of the right vertebral artery, likely related to atherosclerosis. Skeleton: No acute fracture. Other neck: No mass or suspicious adenopathy. Upper chest: Negative. Review of the MIP images confirms the above findings CTA HEAD FINDINGS Anterior circulation: No significant stenosis, proximal occlusion, aneurysm, or vascular malformation. 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 Moderate to severe stenosis of the right P2 PCA with distal  opacification. 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 Mild left P2 PCA stenosis. Venous sinuses: Poorly visualized given arterial timing. Anatomic variants: Hypoplastic left P1 PCA with fetal type left PCA. Review of the MIP images confirms the above findings IMPRESSION: 1. No convincing large vessel occlusion. 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 2. Moderate to severe stenosis of the right P2 PCA and moderate stenosis of the basilar artery. 3. No evidence of hemodynamically significant stenosis in the neck. Findings discussed with Dr. Rory Percy  at 4:50 p.m. via telephone. Electronically Signed   By: Margaretha Sheffield MD   On: 06/21/2020 17:01   DG Chest 1 View  Result Date: 06/22/2020 CLINICAL DATA:  Cough EXAM: CHEST  1 VIEW COMPARISON:  March 24, 2020 FINDINGS: There is slight bibasilar atelectasis. No edema or consolidation. Heart is upper normal in size with pulmonary vascularity normal. No adenopathy. There is aortic atherosclerosis. No bone lesions. IMPRESSION: Slight bibasilar atelectasis. No edema or airspace opacity. Heart upper normal in size. No adenopathy evident. Aortic Atherosclerosis (ICD10-I70.0). Electronically Signed   By: Lowella Grip III M.D.   On: 06/22/2020 09:02   CT Code Stroke CTA Neck W/WO contrast  Result Date: 06/21/2020 CLINICAL DATA:  Stroke follow-up. EXAM: CT ANGIOGRAPHY HEAD AND NECK TECHNIQUE: Multidetector CT imaging of the head and neck was performed using the standard protocol during bolus administration of intravenous contrast. Multiplanar CT image reconstructions and MIPs were obtained to evaluate the vascular anatomy. Carotid stenosis measurements (when applicable) are obtained utilizing NASCET criteria, using the distal internal carotid diameter as the denominator. CONTRAST:  141mL OMNIPAQUE IOHEXOL 350 MG/ML SOLN COMPARISON:  Same day head CT. FINDINGS: CTA NECK  FINDINGS Aortic arch: Great vessel origins are patent. Calcific atherosclerosis. Right carotid system: No evidence of dissection, stenosis (50% or greater) or occlusion. Mild atherosclerosis at the bifurcation. Left carotid system: No evidence of dissection, stenosis (50% or greater) or occlusion. Mild atherosclerosis at the bifurcation. Vertebral arteries: Codominant. No evidence of dissection, stenosis (50% or greater) or occlusion. Mild multifocal narrowing of the right vertebral artery, likely related to atherosclerosis. Skeleton: No acute fracture. Other neck: No mass or suspicious adenopathy. Upper chest: Negative. Review of the MIP images confirms the above findings CTA HEAD FINDINGS Anterior circulation: No significant stenosis, proximal occlusion, aneurysm, or vascular malformation. 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 Moderate to severe stenosis of  the right P2 PCA with distal opacification. 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 Mild left P2 PCA stenosis. Venous sinuses: Poorly visualized given arterial timing. Anatomic variants: Hypoplastic left P1 PCA with fetal type left PCA. Review of the MIP images confirms the above findings IMPRESSION: 1. No convincing large vessel occlusion. 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 2. Moderate to severe stenosis of the right P2 PCA and moderate stenosis of the basilar artery. 3. No evidence of hemodynamically significant stenosis in the neck. Findings discussed with Dr. Rory Percy  at 4:50 p.m. via telephone. Electronically Signed   By: Margaretha Sheffield MD   On: 06/21/2020 17:01   MR BRAIN WO CONTRAST  Result Date: 06/22/2020 CLINICAL DATA:  Encephalopathy EXAM: MRI HEAD WITHOUT CONTRAST TECHNIQUE: Multiplanar, multiecho pulse sequences of the brain and surrounding structures were obtained without intravenous contrast. COMPARISON:  None.  FINDINGS: Brain: No acute infarct, mass effect or extra-axial collection. No acute or chronic hemorrhage. Hyperintense T2-weighted signal is moderately widespread throughout the white matter. Generalized volume loss without a clear lobar predilection. The midline structures are normal. Vascular: Major flow voids are preserved. Skull and upper cervical spine: Normal calvarium and skull base. Visualized upper cervical spine and soft tissues are normal. Sinuses/Orbits:No paranasal sinus fluid levels or advanced mucosal thickening. No mastoid or middle ear effusion. Normal orbits. IMPRESSION: 1. No acute intracranial abnormality. 2. Generalized volume loss and findings of chronic small vessel ischemia. Electronically Signed   By: Ulyses Jarred M.D.   On: 06/22/2020 02:15   CT Code Stroke Cerebral Perfusion with contrast  Result Date: 06/21/2020 CLINICAL DATA:  Stroke follow-up. EXAM: CT PERFUSION BRAIN TECHNIQUE: Multiphase CT imaging of the brain was performed following IV bolus contrast injection. Subsequent parametric perfusion maps were calculated using RAPID software. CONTRAST:  162mL OMNIPAQUE IOHEXOL 350 MG/ML SOLN COMPARISON:  Same day CT imaging FINDINGS: CT Brain Perfusion Findings: CBF (<30%) Volume: 46mL Perfusion (Tmax>6.0s) volume: 11mL Mismatch Volume: 58mL ASPECTS on noncontrast CT Head: 10. Infarct Core: 0 mL Areas of reported perfusion abnormality in bilateral frontal lobes are favored to reflect artifact given they do not correlate with a vascular territory and given artifact in this region. IMPRESSION: No convincing evidence of perfusion abnormality. Small areas of reported perfusion abnormality are favored to reflect artifact given they do not correlate with a vascular territory and given artifact in this region. Findings discussed with Dr. Rory Percy  at 4:50 p.m. via telephone. Electronically Signed   By: Margaretha Sheffield MD   On: 06/21/2020 17:04   CT HEAD CODE STROKE WO CONTRAST  Result Date:  06/21/2020 CLINICAL DATA:  Code stroke. Neuro deficit, acute stroke suspected. Altered mental status. EXAM: CT HEAD WITHOUT CONTRAST TECHNIQUE: Contiguous axial images were obtained from the base of the skull through the vertex without intravenous contrast. COMPARISON:  CT head June 11, 2020.  MRI June 12, 2020. FINDINGS: Brain: No evidence of acute large vascular territory infarction, hemorrhage, hydrocephalus, extra-axial collection or mass lesion/mass effect. Remote infarct involving the ventral right thalamus. Moderate patchy white matter hypoattenuation, most likely related to chronic microvascular ischemic disease. Vascular: Calcific atherosclerosis. No hyperdense vessel identified. Skull: No acute fracture. Sinuses/Orbits: Visualized sinuses are clear.  Unremarkable orbits. Other: No mastoid effusions. ASPECTS [facility] Stroke Program Early CT Score) Total score (0-10 with 10 being normal): 10. IMPRESSION: 1. No evidence of acute intracranial abnormality.  ASPECTS is  10. 2. Remote right thalamic infarct and microvascular ischemic disease. Code stroke imaging results were communicated on 06/21/2020 at 4:27 pm to provider Dr. Rory Percy via secure text paging. Electronically Signed   By: Margaretha Sheffield MD   On: 06/21/2020 16:28        Scheduled Meds: .  stroke: mapping our early stages of recovery book   Does not apply Once  . apixaban  5 mg Oral BID  . atorvastatin  10 mg Oral Daily  . busPIRone  10 mg Oral Daily  . cholecalciferol  1,000 Units Oral Daily  . DULoxetine  60 mg Oral Daily  . [START ON 06/24/2020] levothyroxine  50 mcg Intravenous Daily  . multivitamin with minerals   Oral Daily  . pantoprazole  40 mg Oral BID   Continuous Infusions: . levETIRAcetam 500 mg (06/23/20 0502)     LOS: 2 days    Time spent: 35 mins.More than 50% of that time was spent in counseling and/or coordination of care.      Shelly Coss, MD [facility] P12/18/2021, 8:06 AM

## 2020-06-23 NOTE — Progress Notes (Signed)
Neurology Progress Note   S:// Seen and examined.  Daughter at bedside [place] well without agitation or seeming depressed per daughter No acute changes overnight   O:// Current vital signs: BP (!) 161/91 (BP Location: Left Arm)   Pulse 65   Temp 98.9 F (37.2 C) (Oral)   Resp 17   Ht 5\' 7"  (1.702 m)   Wt 83.3 kg   SpO2 95%   BMI 28.76 kg/m  Vital signs in last 24 hours: Temp:  [97.5 F (36.4 C)-98.9 F (37.2 C)] 98.9 F (37.2 C) (12/18 0805) Pulse Rate:  [61-81] 65 (12/18 0805) Resp:  [15-19] 17 (12/18 0805) BP: (134-161)/(86-94) 161/91 (12/18 0805) SpO2:  [0 %-97 %] 95 % (12/18 0805) General: Sleeping in bed, opens eyes to voice but does not follow commands HEENT: Normocephalic atraumatic CVS: Irregularly irregular Respiratory: Breathing well saturating normally on room air Extremities warm well perfused Neurological exam  Sitting in bed, opens eyes to voice, extremely hard of hearing. 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 Reports (daughter confirms) she played basketball as a child Able to name her daughter. Less sleep but continues to have poor attention  Cranial nerves: Pupils equal round react to light, extraocular movements are not restricted, blinks to threat from both sides, face appears symmetric, auditory acuity is extremely reduced even with hearing aids in place can hardly hear.  Motor exam: Bilateral upper extremities are antigravity.  Can maintain both lower extremities antigravity but unable to follow commands to do confrontational strength testing Sensory exam: As above Coordination difficult to assess given her mentation  Medications  Current Facility-Administered Medications:  .   stroke: mapping our early stages of recovery book, , Does not apply, Once, Agbata, Tochukwu, MD .  acetaminophen (TYLENOL) tablet 650 mg, 650 mg, Oral, Q4H PRN **OR**  acetaminophen (TYLENOL) 160 MG/5ML solution 650 mg, 650 mg, Per Tube, Q4H PRN **OR** acetaminophen (TYLENOL) suppository 650 mg, 650 mg, Rectal, Q4H PRN, Agbata, Tochukwu, MD .  apixaban (ELIQUIS) tablet 5 mg, 5 mg, Oral, BID, Agbata, Tochukwu, MD .  atorvastatin (LIPITOR) tablet 10 mg, 10 mg, Oral, Daily, Agbata, Tochukwu, MD .  busPIRone (BUSPAR) tablet 10 mg, 10 mg, Oral, Daily, Agbata, Tochukwu, MD .  cholecalciferol (VITAMIN D3) tablet 1,000 Units, 1,000 Units, Oral, Daily, Agbata, Tochukwu, MD .  DULoxetine (CYMBALTA) DR capsule 60 mg, 60 mg, Oral, Daily, Amie Portland, MD .  labetalol (NORMODYNE) injection 10 mg, 10 mg, Intravenous, Q6H PRN, Agbata, Tochukwu, MD .  levETIRAcetam (KEPPRA) IVPB 500 mg/100 mL premix, 500 mg, Intravenous, Q12H, Amie Portland, MD, Last Rate: 400 mL/hr at 06/23/20 0502, 500 mg at 06/23/20 0502 .  [START ON 06/24/2020] levothyroxine (SYNTHROID, LEVOTHROID) injection 50 mcg, 50 mcg, Intravenous, Daily, Adhikari, Amrit, MD .  multivitamin with minerals tablet, , Oral, Daily, Agbata, Tochukwu, MD .  pantoprazole (PROTONIX) EC tablet 40 mg, 40 mg, Oral, BID, Agbata, Tochukwu, MD .  senna-docusate (Senokot-S) tablet 1 tablet, 1 tablet, Oral, QHS PRN, Agbata, Tochukwu, MD   Labs CBC    Component Value Date/Time   WBC 8.6 06/23/2020 0547   RBC 5.10 06/23/2020 0547   HGB 14.2 06/23/2020 0547   HCT 44.2 06/23/2020 0547   PLT 247 06/23/2020 0547   MCV 86.7 06/23/2020 0547   MCH 27.8 06/23/2020 0547   MCHC 32.1 06/23/2020 0547   RDW 15.2 06/23/2020 0547   LYMPHSABS 2.2 06/23/2020 0547  MONOABS 0.9 06/23/2020 0547   EOSABS 0.2 06/23/2020 0547   BASOSABS 0.1 06/23/2020 0547    CMP     Component Value Date/Time   NA 137 06/23/2020 0547   K 4.0 06/23/2020 0547   CL 100 06/23/2020 0547   CO2 27 06/23/2020 0547   GLUCOSE 123 (H) 06/23/2020 0547   BUN 20 06/23/2020 0547   CREATININE 0.78 06/23/2020 0547   CALCIUM 8.7 (L) 06/23/2020 0547   PROT 8.3 (H)  06/21/2020 1221   ALBUMIN 4.5 06/21/2020 1221   AST 27 06/21/2020 1221   ALT 22 06/21/2020 1221   ALKPHOS 29 (L) 06/21/2020 1221   BILITOT 0.8 06/21/2020 1221   GFRNONAA >60 06/23/2020 0547   GFRAA 58 (L) 04/09/2020 1156   Lab Results  Component Value Date   HGBA1C 6.6 (H) 06/22/2020   Lipid Panel     Component Value Date/Time   CHOL 152 06/22/2020 0554   TRIG 80 06/22/2020 0554   HDL 55 06/22/2020 0554   CHOLHDL 2.8 06/22/2020 0554   VLDL 16 06/22/2020 0554   LDLCALC 81 06/22/2020 0554   TSH in September was 4.45  B12 397  Ammonia 10   EEG with focal slowing on a background of diffuse encephalopathy "This study is suggestive of cortical dysfunction in left frontotemporal region secondary to underlying structural abnormality, post-ictal state. Additionally there is moderate diffuse encephalopathy, nonspecific etiology. No seizures or epileptiform discharges were seen throughout the recording."  Imaging I have reviewed images in epic and the results pertinent to this consultation are: CT head no acute changes CTA head and neck with no emergent LVO.  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  Next imaging with no stenosis. MRI brain with no acute intracranial abnormality.  No stroke.  Generalized volume loss and findings of chronic small vessel ischemia.  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  She appears to be gradually improving though she is not back at her prior baseline.  Daughter reports that she has discussed transitioning the patient to hospice given concern for underlying dementia. We discussed again that her  event many years ago was unlikely to be transient global amnesia.  We discussed that dementia is a progressive process and the patient's typically experience more marked stepwise declines on top of progressive declines with significant stressors such as stroke.  We discussed that the EEG did show some focal slowing and that this could potentially be secondary to seizure. . Had an extended discussion with daughter today about Keppra side effects and benefits, possibility of removing Keppra if it is causing difficulties in the future, possibility of continuing to wean Cymbalta, deferring discussion of other medications to palliative/hospice/primary team.    Impression: Multifactorial toxic metabolic encephalopathy Hyponatremia Possible side effect from medications such as Cymbalta & Buspar On empiric treatment for seizures  Recommendations: -Continue Cymbalta 60 mg daily, with potential for further weaning per outpatient providers -Continue Keppra 500 mg twice daily, weaning if needed per outpatient providers -Primary team to order ambulatory referral to neurology on discharge -Neurology will sign off at this time, please reconsult if any questions arise  -- Lesleigh Noe MD-PhD [facility] [phone]   If  8pm to 8am, please call on call as listed on [facility]ION.  Given extended discussions with family, overall 40 minutes of care were provided to this patient today, greater than 50% at bedside

## 2020-06-24 MED ORDER — SENNOSIDES-DOCUSATE SODIUM 8.6-50 MG PO TABS: 1.0000 | ORAL_TABLET | Freq: Two times a day (BID) | ORAL | 1 refills | Status: DC

## 2020-06-24 MED ORDER — POLYETHYLENE GLYCOL 3350 17 G PO PACK
17.0000 g | PACK | Freq: Every day | ORAL | Status: DC
  Administered 2020-06-24 – 2020-06-25 (×2): 17 g via ORAL
  Filled 2020-06-24 (×2): qty 1

## 2020-06-24 MED ORDER — POLYETHYLENE GLYCOL 3350 17 G PO PACK: 17.0000 g | PACK | Freq: Every day | ORAL | 0 refills | Status: DC

## 2020-06-24 MED ORDER — AMLODIPINE BESYLATE 10 MG PO TABS: 10.0000 mg | ORAL_TABLET | Freq: Every day | ORAL | 1 refills | Status: DC

## 2020-06-24 MED ORDER — SENNOSIDES-DOCUSATE SODIUM 8.6-50 MG PO TABS
1.0000 | ORAL_TABLET | Freq: Two times a day (BID) | ORAL | Status: DC
  Administered 2020-06-24 – 2020-06-25 (×3): 1 via ORAL
  Filled 2020-06-24 (×3): qty 1

## 2020-06-24 MED ORDER — LEVETIRACETAM 500 MG PO TABS: 500.0000 mg | ORAL_TABLET | Freq: Two times a day (BID) | ORAL | 1 refills | Status: DC

## 2020-06-24 MED ORDER — BUTALBITAL-APAP-CAFFEINE 50-325-40 MG PO TABS: 2.0000 | ORAL_TABLET | Freq: Four times a day (QID) | ORAL | 0 refills | Status: DC | PRN

## 2020-06-24 MED ORDER — AMLODIPINE BESYLATE 5 MG PO TABS
5.0000 mg | ORAL_TABLET | Freq: Every day | ORAL | Status: DC
  Administered 2020-06-24 – 2020-06-25 (×2): 5 mg via ORAL
  Filled 2020-06-24 (×2): qty 1

## 2020-06-24 MED ORDER — DULOXETINE HCL 60 MG PO CPEP: 60.0000 mg | ORAL_CAPSULE | Freq: Every day | ORAL | 3 refills | Status: DC

## 2020-06-24 NOTE — Plan of Care (Signed)
  Problem: Education: Goal: Knowledge of disease or condition will improve 06/24/2020 1144 by Montel Culver, RN Outcome: Adequate for Discharge 06/24/2020 1144 by Montel Culver, RN Outcome: Adequate for Discharge   Problem: Self-Care: Goal: Ability to participate in self-care as condition permits will improve Outcome: Adequate for Discharge Goal: Ability to communicate needs accurately will improve Outcome: Adequate for Discharge   Problem: Nutrition: Goal: Risk of aspiration will decrease Outcome: Adequate for Discharge Goal: Dietary intake will improve Outcome: Adequate for Discharge   Problem: Education: Goal: Knowledge of General Education information will improve Description: Including pain rating scale, medication(s)/side effects and non-pharmacologic comfort measures Outcome: Adequate for Discharge   Problem: Health Behavior/Discharge Planning: Goal: Ability to manage health-related needs will improve Outcome: Adequate for Discharge   Problem: Clinical Measurements: Goal: Ability to maintain clinical measurements within normal limits will improve Outcome: Adequate for Discharge Goal: Will remain free from infection Outcome: Adequate for Discharge Goal: Diagnostic test results will improve Outcome: Adequate for Discharge Goal: Respiratory complications will improve Outcome: Adequate for Discharge Goal: Cardiovascular complication will be avoided Outcome: Adequate for Discharge   Problem: Activity: Goal: Risk for activity intolerance will decrease Outcome: Adequate for Discharge   Problem: Nutrition: Goal: Adequate nutrition will be maintained Outcome: Adequate for Discharge   Problem: Coping: Goal: Level of anxiety will decrease Outcome: Adequate for Discharge   Problem: Elimination: Goal: Will not experience complications related to bowel motility Outcome: Adequate for Discharge Goal: Will not experience complications related to urinary  retention Outcome: Adequate for Discharge   Problem: Pain Managment: Goal: General experience of comfort will improve Outcome: Adequate for Discharge   Problem: Safety: Goal: Ability to remain free from injury will improve Outcome: Adequate for Discharge   Problem: Skin Integrity: Goal: Risk for impaired skin integrity will decrease Outcome: Adequate for Discharge

## 2020-06-24 NOTE — Progress Notes (Signed)
Hospice should have hospital bed set up at home prior to transport to home per care aker daughter Luanna Cole.

## 2020-06-24 NOTE — Discharge Summary (Addendum)
Physician Discharge Summary  Jenny [name]:[id number] DOB: 07/27/1927 DOA: 06/21/2020  PCP: Derinda Late, MD  Admit date: 06/21/2020 Discharge date: 06/25/2020  Admitted From: Home Disposition:  Home  Discharge Condition:Stable CODE STATUS:FULL Diet recommendation: Regular  Brief/Interim Summary: Patient is a 86 year old female with history of hypertension, GERD, depression, hyperlipidemia, A. fib, ischemic stroke in September 2021 with residual left lower extremity weakness who presents to the emergency department for the evaluation of sudden change  in the mental status at home.  She was recently seen in the emergency department for a fall and was discharged to a skilled nursing facility but then she was sent back to home.  On presentation, labs were reassuring.   Code stroke was called.  Neurology consulted.  MRI did not show any acute intracranial abnormalities.  CT angiogram head and neck did not show any large vessel occlusion.  Patient was admitted for the evaluation of altered mental status.   Mental status has significantly improved  and she is alert on awake but still confused.  Daughter is interested on setting up hospice at home.  Plan for discharge today to home with hospice.  Following problems were addressed during her hospitalization:  Altered mental status: Likely metabolic encephalopathy but etiology remains obscure.  As per the daughter, she is alert and usually oriented on her good days and is ambulatory but has memory deficits.  On presentation, labs were reassuring.   Code stroke was called.  Neurology consulted.  MRI did not show any acute intracranial abnormalities.  CT angiogram head and neck did not show any large vessel occlusion but showed moderate to severely stenosis of right P2 PCA.  Patient was admitted for the evaluation of altered mental status. Normal ammonia, vitamin B12.  UA not suggestive of UTI.  Chest x-ray did not show pneumonia. Delirium  precautions, frequent reorientation, keep room bright with day sunlight. EEG showed cortical dysfunction in the left frontotemporal region, moderate diffuse encephalopathy, no seizures or epileptiform discharges.  Currently on  Keppra 500 mg twice a day. Dose of Cymbalta reduced Mental status has improved now.she is alert on awake but still confused.    Paroxysmal A. fib: On Eliquis for anticoagulation.  Continue telemetry monitoring.  Currently heart rate well controlled.  Currently in normal sinus rhythm.  Hypothyroidism: Continue Synthyroid  History of nonhemorrhagic stroke: In September 2021.  Has left lower extremity weakness.  Takes Lipitor 10 mg daily.  Hypertension: Continue amlodipine 10 mg daily.    History of depression: Cymbalta to be continued on reduced dose  Hyponatremia: Resolved  Goals of care: Patient has multiple comorbidities.  She was following with palliative care as an outpatient.  Daughter is now interested on setting up hospice at home.  She is interested to take her mom to home whenever possible.  I have put TOC consult for management of home hospice      Discharge Diagnoses:  Principal Problem:   AMS (altered mental status) Active Problems:   Atrial fibrillation, chronic (HCC)   Hypothyroid    Discharge Instructions  Discharge Instructions    Diet general   Complete by: As directed    Discharge instructions   Complete by: As directed    1)Please take prescribed medication as instructed 2)Follow up with hospice at home   Increase activity slowly   Complete by: As directed      Allergies as of 06/25/2020      Reactions   Flagyl [metronidazole] Nausea And Vomiting  Medication List    STOP taking these medications   atorvastatin 10 MG tablet Commonly known as: LIPITOR   cholecalciferol 25 MCG (1000 UNIT) tablet Commonly known as: VITAMIN D3   ferrous sulfate 325 (65 FE) MG tablet   MULTIVITAMIN PO     TAKE these  medications   acetaminophen 500 MG tablet Commonly known as: TYLENOL Take 500 mg by mouth every 6 (six) hours as needed for mild pain, fever or headache.   amLODipine 10 MG tablet Commonly known as: NORVASC Take 1 tablet (10 mg total) by mouth daily.   apixaban 5 MG Tabs tablet Commonly known as: ELIQUIS Take 1 tablet (5 mg total) by mouth 2 (two) times daily.   busPIRone 10 MG tablet Commonly known as: BUSPAR Take 10 mg by mouth 2 (two) times daily.   butalbital-acetaminophen-caffeine 50-325-40 MG tablet Commonly known as: FIORICET Take 2 tablets by mouth every 6 (six) hours as needed for headache.   DULoxetine 60 MG capsule Commonly known as: CYMBALTA Take 1 capsule (60 mg total) by mouth daily. What changed: how much to take   levETIRAcetam 500 MG tablet Commonly known as: Keppra Take 1 tablet (500 mg total) by mouth 2 (two) times daily.   levothyroxine 100 MCG tablet Commonly known as: SYNTHROID Take 100 mcg by mouth daily before breakfast.   losartan 50 MG tablet Commonly known as: Cozaar Take 1 tablet (50 mg total) by mouth daily.   pantoprazole 40 MG tablet Commonly known as: PROTONIX Take 40 mg by mouth 2 (two) times daily.   polyethylene glycol 17 g packet Commonly known as: MIRALAX / GLYCOLAX Take 17 g by mouth daily.   senna-docusate 8.6-50 MG tablet Commonly known as: Senokot-S Take 1 tablet by mouth 2 (two) times daily.            Durable Medical Equipment  (From admission, onward)         Start     Ordered   06/24/20 1204  For home use only DME Hospital bed  Once       Question Answer Comment  Length of Need Lifetime   Bed type Semi-electric      06/24/20 1203          Follow-up Information    Derinda Late, MD. Schedule an appointment as soon as possible for a visit in 1 week.   Specialty: Family Medicine Why: Doctor office will call patient back Contact information: [address] [facility] [place] [address] [phone]              Allergies  Allergen Reactions  . Flagyl [Metronidazole] Nausea And Vomiting    Consultations: neurology  Procedures/Studies: EEG  Result Date: 06/22/2020 Lora Havens, MD     06/22/2020  2:24 PM Patient Name: Jenny [name] MRN: [id number] Epilepsy Attending: Lora Havens Referring Physician/Provider: Dr Collier Bullock Date: 06/22/2020 Duration: 39.18 mins Patient history: 86 year old woman with past medical history of paroxysmal atrial fibrillation on anticoagulation with apixaban, recent right thalamic capsular stroke and to left frontal strokes with no residual deficits, coming from home with complaints of altered mental status and no verbal output. EEG to evaluate for seizure. Level of alertness: Awake AEDs during EEG study: LEV Technical aspects: This EEG study was done with scalp electrodes positioned according to the 10-20 International system of electrode placement. Electrical activity was acquired at a sampling rate of 500Hz  and reviewed with a high  frequency filter of 70Hz  and a low frequency filter of 1Hz . EEG data were recorded continuously and digitally stored. Description: EEG showed continuous generalized and maximal left frontotemporal region 3 to 6 Hz theta-delta slowing. Hyperventilation and photic stimulation were not performed.   ABNORMALITY -Continuous slow, generalized and maximal left  frontotemporal region IMPRESSION: This study is suggestive of cortical dysfunction in left frontotemporal region secondary to underlying structural abnormality, post-ictal state. Additionally there is moderate diffuse encephalopathy, nonspecific etiology. No seizures or epileptiform discharges were seen throughout the recording. Lora Havens   CT Code Stroke CTA Head W/WO contrast  Result Date: 06/21/2020 CLINICAL DATA:  Stroke follow-up. EXAM: CT ANGIOGRAPHY HEAD AND NECK TECHNIQUE: Multidetector CT imaging of  the head and neck was performed using the standard protocol during bolus administration of intravenous contrast. Multiplanar CT image reconstructions and MIPs were obtained to evaluate the vascular anatomy. Carotid stenosis measurements (when applicable) are obtained utilizing NASCET criteria, using the distal internal carotid diameter as the denominator. CONTRAST:  124mL OMNIPAQUE IOHEXOL 350 MG/ML SOLN COMPARISON:  Same day head CT. FINDINGS: CTA NECK FINDINGS Aortic arch: Great vessel origins are patent. Calcific atherosclerosis. Right carotid system: No evidence of dissection, stenosis (50% or greater) or occlusion. Mild atherosclerosis at the bifurcation. Left carotid system: No evidence of dissection, stenosis (50% or greater) or occlusion. Mild atherosclerosis at the bifurcation. Vertebral arteries: Codominant. No evidence of dissection, stenosis (50% or greater) or occlusion. Mild multifocal narrowing of the right vertebral artery, likely related to atherosclerosis. Skeleton: No acute fracture. Other neck: No mass or suspicious adenopathy. Upper chest: Negative. Review of the MIP images confirms the above findings CTA HEAD FINDINGS Anterior circulation: No significant stenosis, proximal occlusion, aneurysm, or vascular malformation. 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 Moderate to severe stenosis of the right P2 PCA with distal opacification. 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 Mild left P2 PCA stenosis. Venous sinuses: Poorly visualized given arterial timing. Anatomic variants: Hypoplastic left P1 PCA with fetal type left PCA. Review of the MIP images confirms the above findings IMPRESSION: 1. No convincing large vessel occlusion. 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 2. Moderate to severe stenosis of the right P2 PCA and moderate stenosis of the basilar artery. 3. No evidence  of hemodynamically significant stenosis in the neck. Findings discussed with Dr. Rory Percy  at 4:50 p.m. via telephone. Electronically Signed   By: Margaretha Sheffield MD   On: 06/21/2020 17:01   DG Chest 1 View  Result Date: 06/22/2020 CLINICAL DATA:  Cough EXAM: CHEST  1 VIEW COMPARISON:  March 24, 2020 FINDINGS: There is slight bibasilar atelectasis. No edema or consolidation. Heart is upper normal in size with pulmonary vascularity normal. No adenopathy. There is aortic atherosclerosis. No bone lesions. IMPRESSION: Slight bibasilar atelectasis. No edema or airspace opacity. Heart upper normal in size. No adenopathy evident. Aortic Atherosclerosis (ICD10-I70.0). Electronically Signed   By: Lowella Grip III M.D.   On: 06/22/2020 09:02   CT Head Wo Contrast  Result Date: 06/11/2020 CLINICAL DATA:  Head trauma fall EXAM: CT HEAD WITHOUT CONTRAST TECHNIQUE: Contiguous axial images were obtained from the base of the skull through the vertex without intravenous contrast. COMPARISON:  MRI 03/26/2020, CT 03/26/2020 FINDINGS: Brain: no acute territorial infarction, hemorrhage or intracranial mass. Mild atrophy. Moderate hypodensity in the white matter consistent with chronic small vessel ischemic change.  Chronic right thalamic infarct. Stable ventricle size. Vascular: No hyperdense vessels.  Carotid vascular calcification. Skull: Normal. Negative for fracture or focal lesion. Sinuses/Orbits: No acute finding. Other: None IMPRESSION: 1. No CT evidence for acute intracranial abnormality. 2. Atrophy and chronic small vessel ischemic changes of the white matter. Chronic right thalamic infarct. Electronically Signed   By: Donavan Foil M.D.   On: 06/11/2020 23:10   CT Code Stroke CTA Neck W/WO contrast  Result Date: 06/21/2020 CLINICAL DATA:  Stroke follow-up. EXAM: CT ANGIOGRAPHY HEAD AND NECK TECHNIQUE: Multidetector CT imaging of the head and neck was performed using the standard protocol during bolus  administration of intravenous contrast. Multiplanar CT image reconstructions and MIPs were obtained to evaluate the vascular anatomy. Carotid stenosis measurements (when applicable) are obtained utilizing NASCET criteria, using the distal internal carotid diameter as the denominator. CONTRAST:  131mL OMNIPAQUE IOHEXOL 350 MG/ML SOLN COMPARISON:  Same day head CT. FINDINGS: CTA NECK FINDINGS Aortic arch: Great vessel origins are patent. Calcific atherosclerosis. Right carotid system: No evidence of dissection, stenosis (50% or greater) or occlusion. Mild atherosclerosis at the bifurcation. Left carotid system: No evidence of dissection, stenosis (50% or greater) or occlusion. Mild atherosclerosis at the bifurcation. Vertebral arteries: Codominant. No evidence of dissection, stenosis (50% or greater) or occlusion. Mild multifocal narrowing of the right vertebral artery, likely related to atherosclerosis. Skeleton: No acute fracture. Other neck: No mass or suspicious adenopathy. Upper chest: Negative. Review of the MIP images confirms the above findings CTA HEAD FINDINGS Anterior circulation: No significant stenosis, proximal occlusion, aneurysm, or vascular malformation. 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 Moderate to severe stenosis of the right P2 PCA with distal opacification. 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 Mild left P2 PCA stenosis. Venous sinuses: Poorly visualized given arterial timing. Anatomic variants: Hypoplastic left P1 PCA with fetal type left PCA. Review of the MIP images confirms the above findings IMPRESSION: 1. No convincing large vessel occlusion. 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 2. Moderate to severe stenosis of the right P2 PCA and moderate stenosis of the basilar artery. 3. No evidence of hemodynamically significant stenosis in the neck. Findings discussed  with Dr. Rory Percy  at 4:50 p.m. via telephone. Electronically Signed   By: Margaretha Sheffield MD   On: 06/21/2020 17:01   CT Cervical Spine Wo Contrast  Result Date: 06/11/2020 CLINICAL DATA:  Fall EXAM: CT CERVICAL SPINE WITHOUT CONTRAST TECHNIQUE: Multidetector CT imaging of the cervical spine was performed without intravenous contrast. Multiplanar CT image reconstructions were also generated. COMPARISON:  CT 03/24/2020 FINDINGS: Alignment: Straightening of the cervical spine.  No subluxation Skull base and vertebrae: No fracture. Stable lucency in C7 vertebra with slight stippled appearance on axial images suggesting hemangioma. Soft tissues and spinal canal: No prevertebral fluid or swelling. No visible canal hematoma. Disc levels: Moderate to marked diffuse degenerative changes throughout the cervical spine with multiple level disc space narrowing and osteophyte. Mild facet degenerative changes at multiple levels. Upper chest: Negative. Other: None IMPRESSION: Straightening of the cervical spine with moderate to marked degenerative changes. No acute osseous abnormality. Electronically Signed   By: Donavan Foil M.D.   On: 06/11/2020 23:14   MR BRAIN WO CONTRAST  Result Date: 06/22/2020 CLINICAL DATA:  Encephalopathy EXAM: MRI HEAD WITHOUT CONTRAST TECHNIQUE: Multiplanar, multiecho pulse sequences of the brain and surrounding structures were obtained without  intravenous contrast. COMPARISON:  None. FINDINGS: Brain: No acute infarct, mass effect or extra-axial collection. No acute or chronic hemorrhage. Hyperintense T2-weighted signal is moderately widespread throughout the white matter. Generalized volume loss without a clear lobar predilection. The midline structures are normal. Vascular: Major flow voids are preserved. Skull and upper cervical spine: Normal calvarium and skull base. Visualized upper cervical spine and soft tissues are normal. Sinuses/Orbits:No paranasal sinus fluid levels or advanced  mucosal thickening. No mastoid or middle ear effusion. Normal orbits. IMPRESSION: 1. No acute intracranial abnormality. 2. Generalized volume loss and findings of chronic small vessel ischemia. Electronically Signed   By: Ulyses Jarred M.D.   On: 06/22/2020 02:15   MR BRAIN WO CONTRAST  Result Date: 06/12/2020 CLINICAL DATA:  Initial evaluation for acute dizziness. EXAM: MRI HEAD WITHOUT CONTRAST TECHNIQUE: Multiplanar, multiecho pulse sequences of the brain and surrounding structures were obtained without intravenous contrast. COMPARISON:  Prior head CT from 06/11/2020. FINDINGS: Brain: Generalized age appropriate cerebral atrophy. Patchy and confluent T2/FLAIR hyperintensity within the periventricular deep white matter both cerebral hemispheres as well as the pons, most consistent with chronic small vessel ischemic disease, moderate in nature. Remote lacunar infarct present within the ventral right thalamus. No abnormal foci of restricted diffusion to suggest acute or subacute ischemia. Gray-white matter differentiation maintained. No encephalomalacia to suggest chronic cortical infarction elsewhere within the brain. No foci of susceptibility artifact to suggest acute or chronic intracranial hemorrhage. No mass lesion, midline shift or mass effect. Ventricles normal size without hydrocephalus. No extra-axial fluid collection. Pituitary gland suprasellar region within normal limits. Midline structures intact. Vascular: Major intracranial vascular flow voids are maintained. Skull and upper cervical spine: Craniocervical junction within normal limits. Bone marrow signal intensity normal. No scalp soft tissue abnormality. Sinuses/Orbits: Patient status post bilateral ocular lens replacement. Globes and orbital soft tissues demonstrate no acute finding. Paranasal sinuses are clear. Trace left mastoid effusion, of doubtful significance. Inner ear structures grossly normal. Other: None. IMPRESSION: 1. No acute  intracranial abnormality. 2. Age-related cerebral atrophy with moderate chronic microvascular ischemic disease. 3. Remote lacunar infarct involving the ventral right thalamus. Electronically Signed   By: Jeannine Boga M.D.   On: 06/12/2020 03:31   CT Code Stroke Cerebral Perfusion with contrast  Result Date: 06/21/2020 CLINICAL DATA:  Stroke follow-up. EXAM: CT PERFUSION BRAIN TECHNIQUE: Multiphase CT imaging of the brain was performed following IV bolus contrast injection. Subsequent parametric perfusion maps were calculated using RAPID software. CONTRAST:  119mL OMNIPAQUE IOHEXOL 350 MG/ML SOLN COMPARISON:  Same day CT imaging FINDINGS: CT Brain Perfusion Findings: CBF (<30%) Volume: 28mL Perfusion (Tmax>6.0s) volume: 24mL Mismatch Volume: 34mL ASPECTS on noncontrast CT Head: 10. Infarct Core: 0 mL Areas of reported perfusion abnormality in bilateral frontal lobes are favored to reflect artifact given they do not correlate with a vascular territory and given artifact in this region. IMPRESSION: No convincing evidence of perfusion abnormality. Small areas of reported perfusion abnormality are favored to reflect artifact given they do not correlate with a vascular territory and given artifact in this region. Findings discussed with Dr. Rory Percy  at 4:50 p.m. via telephone. Electronically Signed   By: Margaretha Sheffield MD   On: 06/21/2020 17:04   DG Toe Great Right  Result Date: 06/11/2020 CLINICAL DATA:  Right great toe pain, status post trauma. EXAM: RIGHT GREAT TOE COMPARISON:  October 03, 2015 FINDINGS: A small nondisplaced fracture is seen involving the tuft of the distal phalanx of the right great toe. There is  no evidence of dislocation. There is no evidence of arthropathy or other focal bone abnormality. Mild diffuse soft tissue swelling is noted. IMPRESSION: Nondisplaced fracture of the tuft of the distal phalanx of the right great toe. Electronically Signed   By: Virgina Norfolk M.D.   On:  06/11/2020 20:38   CT HEAD CODE STROKE WO CONTRAST  Result Date: 06/21/2020 CLINICAL DATA:  Code stroke. Neuro deficit, acute stroke suspected. Altered mental status. EXAM: CT HEAD WITHOUT CONTRAST TECHNIQUE: Contiguous axial images were obtained from the base of the skull through the vertex without intravenous contrast. COMPARISON:  CT head June 11, 2020.  MRI June 12, 2020. FINDINGS: Brain: No evidence of acute large vascular territory infarction, hemorrhage, hydrocephalus, extra-axial collection or mass lesion/mass effect. Remote infarct involving the ventral right thalamus. Moderate patchy white matter hypoattenuation, most likely related to chronic microvascular ischemic disease. Vascular: Calcific atherosclerosis. No hyperdense vessel identified. Skull: No acute fracture. Sinuses/Orbits: Visualized sinuses are clear.  Unremarkable orbits. Other: No mastoid effusions. ASPECTS [facility] Stroke Program Early CT Score) Total score (0-10 with 10 being normal): 10. IMPRESSION: 1. No evidence of acute intracranial abnormality.  ASPECTS is 10. 2. Remote right thalamic infarct and microvascular ischemic disease. Code stroke imaging results were communicated on 06/21/2020 at 4:27 pm to provider Dr. Rory Percy via secure text paging. Electronically Signed   By: Margaretha Sheffield MD   On: 06/21/2020 16:28      Subjective:  Patient seen and examined at the bedside this morning.  Daughter at the bedside.  She is still confused but alert, awake and communicates well.  Stable for discharge today to home with hospice  Discharge Exam: Vitals:   06/25/20 0753 06/25/20 0824  BP: (!) 187/111 (!) 174/102  Pulse: (!) 57   Resp: 15   Temp: 97.9 F (36.6 C)   SpO2: 96%    Vitals:   06/24/20 2353 06/25/20 0424 06/25/20 0753 06/25/20 0824  BP: (!) 171/80 (!) 195/92 (!) 187/111 (!) 174/102  Pulse: 71 68 (!) 57   Resp: 16 16 15    Temp: 97.8 F (36.6 C) 97.6 F (36.4 C) 97.9 F (36.6 C)   TempSrc:       SpO2: 100% 97% 96%   Weight:      Height:        General: Pt is alert, awake, not in acute distress Cardiovascular: RRR, S1/S2 +, no rubs, no gallops Respiratory: CTA bilaterally, no wheezing, no rhonchi Abdominal: Soft, NT, ND, bowel sounds + Extremities: no edema, no cyanosis    The results of significant diagnostics from this hospitalization (including imaging, microbiology, ancillary and laboratory) are listed below for reference.     Microbiology: Recent Results (from the past 240 hour(s))  Resp Panel by RT-PCR (Flu A&B, Covid) Nasopharyngeal Swab     Status: None   Collection Time: 06/21/20  4:46 PM   Specimen: Nasopharyngeal Swab; Nasopharyngeal(NP) swabs in vial transport medium  Result Value Ref Range Status   SARS Coronavirus 2 by RT PCR NEGATIVE NEGATIVE Final    Comment: (NOTE) SARS-CoV-2 target nucleic acids are NOT DETECTED.  The SARS-CoV-2 RNA is generally detectable in upper respiratory specimens during the acute phase of infection. The lowest concentration of SARS-CoV-2 viral copies this assay can detect is 138 copies/mL. A negative result does not preclude SARS-Cov-2 infection and should not be used as the sole basis for treatment or other patient management decisions. A negative result may occur with  improper specimen collection/handling, submission of specimen  other than nasopharyngeal swab, presence of viral mutation(s) within the areas targeted by this assay, and inadequate number of viral copies(<138 copies/mL). A negative result must be combined with clinical observations, patient history, and epidemiological information. The expected result is Negative.  Fact Sheet for Patients:  EntrepreneurPulse.com.au  Fact Sheet for Healthcare Providers:  IncredibleEmployment.be  This test is no t yet approved or cleared by the [place] FDA and  has been authorized for detection and/or diagnosis of SARS-CoV-2 by FDA  under an Emergency Use Authorization (EUA). This EUA will remain  in effect (meaning this test can be used) for the duration of the COVID-19 declaration under Section 564(b)(1) of the Act, 21 U.S.C.section 360bbb-3(b)(1), unless the authorization is terminated  or revoked sooner.       Influenza A by PCR NEGATIVE NEGATIVE Final   Influenza B by PCR NEGATIVE NEGATIVE Final    Comment: (NOTE) The Xpert Xpress SARS-CoV-2/FLU/RSV plus assay is intended as an aid in the diagnosis of influenza from Nasopharyngeal swab specimens and should not be used as a sole basis for treatment. Nasal washings and aspirates are unacceptable for Xpert Xpress SARS-CoV-2/FLU/RSV testing.  Fact Sheet for Patients: EntrepreneurPulse.com.au  Fact Sheet for Healthcare Providers: IncredibleEmployment.be  This test is not yet approved or cleared by the [place] FDA and has been authorized for detection and/or diagnosis of SARS-CoV-2 by FDA under an Emergency Use Authorization (EUA). This EUA will remain in effect (meaning this test can be used) for the duration of the COVID-19 declaration under Section 564(b)(1) of the Act, 21 U.S.C. section 360bbb-3(b)(1), unless the authorization is terminated or revoked.  Performed at [facility], [address] [place],  [address]      Labs: BNP (last 3 results) No results for input(s): BNP in the last 8760 hours. Basic Metabolic Panel: Recent Labs  Lab 06/21/20 1221 06/21/20 1604 06/23/20 0547  NA 131*  --  137  K 4.2  --  4.0  CL 93*  --  100  CO2 25  --  27  GLUCOSE 161*  --  123*  BUN 17  --  20  CREATININE 0.73  --  0.78  CALCIUM 9.6  --  8.7*  MG  --  1.9  --    Liver Function Tests: Recent Labs  Lab 06/21/20 1221  AST 27  ALT 22  ALKPHOS 29*  BILITOT 0.8  PROT 8.3*  ALBUMIN 4.5   No results for input(s): LIPASE, AMYLASE in the last 168 hours. Recent Labs  Lab 06/22/20 1035   AMMONIA 10   CBC: Recent Labs  Lab 06/21/20 1221 06/23/20 0547  WBC 11.4*  11.6* 8.6  NEUTROABS 9.2* 5.2  HGB 14.9  15.0 14.2  HCT 46.4*  46.1* 44.2  MCV 85.3  84.6 86.7  PLT 297  295 247   Cardiac Enzymes: No results for input(s): CKTOTAL, CKMB, CKMBINDEX, TROPONINI in the last 168 hours. BNP: Invalid input(s): POCBNP CBG: Recent Labs  Lab 06/21/20 1637  GLUCAP 175*   D-Dimer No results for input(s): DDIMER in the last 72 hours. Hgb A1c No results for input(s): HGBA1C in the last 72 hours. Lipid Profile No results for input(s): CHOL, HDL, LDLCALC, TRIG, CHOLHDL, LDLDIRECT in the last 72 hours. Thyroid function studies No results for input(s): TSH, T4TOTAL, T3FREE, THYROIDAB in the last 72 hours.  Invalid input(s): FREET3 Anemia work up No results for input(s): VITAMINB12, FOLATE, FERRITIN, TIBC, IRON, RETICCTPCT in the last 72 hours. Urinalysis  Component Value Date/Time   COLORURINE YELLOW (A) 06/21/2020 1646   APPEARANCEUR HAZY (A) 06/21/2020 1646   LABSPEC >1.046 (H) 06/21/2020 1646   PHURINE 7.0 06/21/2020 1646   GLUCOSEU 50 (A) 06/21/2020 1646   HGBUR NEGATIVE 06/21/2020 1646   BILIRUBINUR NEGATIVE 06/21/2020 1646   KETONESUR 5 (A) 06/21/2020 1646   PROTEINUR 100 (A) 06/21/2020 1646   NITRITE NEGATIVE 06/21/2020 1646   LEUKOCYTESUR NEGATIVE 06/21/2020 1646   Sepsis Labs Invalid input(s): PROCALCITONIN,  WBC,  LACTICIDVEN Microbiology Recent Results (from the past 240 hour(s))  Resp Panel by RT-PCR (Flu A&B, Covid) Nasopharyngeal Swab     Status: None   Collection Time: 06/21/20  4:46 PM   Specimen: Nasopharyngeal Swab; Nasopharyngeal(NP) swabs in vial transport medium  Result Value Ref Range Status   SARS Coronavirus 2 by RT PCR NEGATIVE NEGATIVE Final    Comment: (NOTE) SARS-CoV-2 target nucleic acids are NOT DETECTED.  The SARS-CoV-2 RNA is generally detectable in upper respiratory specimens during the acute phase of infection. The  lowest concentration of SARS-CoV-2 viral copies this assay can detect is 138 copies/mL. A negative result does not preclude SARS-Cov-2 infection and should not be used as the sole basis for treatment or other patient management decisions. A negative result may occur with  improper specimen collection/handling, submission of specimen other than nasopharyngeal swab, presence of viral mutation(s) within the areas targeted by this assay, and inadequate number of viral copies(<138 copies/mL). A negative result must be combined with clinical observations, patient history, and epidemiological information. The expected result is Negative.  Fact Sheet for Patients:  EntrepreneurPulse.com.au  Fact Sheet for Healthcare Providers:  IncredibleEmployment.be  This test is no t yet approved or cleared by the [place] FDA and  has been authorized for detection and/or diagnosis of SARS-CoV-2 by FDA under an Emergency Use Authorization (EUA). This EUA will remain  in effect (meaning this test can be used) for the duration of the COVID-19 declaration under Section 564(b)(1) of the Act, 21 U.S.C.section 360bbb-3(b)(1), unless the authorization is terminated  or revoked sooner.       Influenza A by PCR NEGATIVE NEGATIVE Final   Influenza B by PCR NEGATIVE NEGATIVE Final    Comment: (NOTE) The Xpert Xpress SARS-CoV-2/FLU/RSV plus assay is intended as an aid in the diagnosis of influenza from Nasopharyngeal swab specimens and should not be used as a sole basis for treatment. Nasal washings and aspirates are unacceptable for Xpert Xpress SARS-CoV-2/FLU/RSV testing.  Fact Sheet for Patients: EntrepreneurPulse.com.au  Fact Sheet for Healthcare Providers: IncredibleEmployment.be  This test is not yet approved or cleared by the [place] FDA and has been authorized for detection and/or diagnosis of SARS-CoV-2 by FDA under  an Emergency Use Authorization (EUA). This EUA will remain in effect (meaning this test can be used) for the duration of the COVID-19 declaration under Section 564(b)(1) of the Act, 21 U.S.C. section 360bbb-3(b)(1), unless the authorization is terminated or revoked.  Performed at [facility], [address] [place], [place] [address]     Please note: You were cared for by a hospitalist during your hospital stay. Once you are discharged, your primary care physician will handle any further medical issues. Please note that NO REFILLS for any discharge medications will be authorized once you are discharged, as it is imperative that you return to your primary care physician (or establish a relationship with a primary care physician if you do not have one) for your post hospital discharge needs so  that they can reassess your need for medications and monitor your lab values.    Time coordinating discharge: 40 minutes  SIGNED:   Shelly Coss, MD  [facility] 06/25/2020, 10:12 AM Pager [phone]  If 7PM-7AM, please contact night-coverage [email or web address] Password [email or web address]

## 2020-06-24 NOTE — Progress Notes (Signed)
Patient to discharge home once bed from hospice is delivered to house. Waiting on to hear back from social work when bed will be/has been delivered.

## 2020-06-24 NOTE — TOC Progression Note (Addendum)
Transition of Care [facility]) - Progression Note    Patient Details  Name: Jenny [name] MRN: [id number] Date of Birth: 04/07/1928  Transition of Care [facility]) CM/SW Contact  Jenny Price, RN Phone Number: 06/24/2020, 2:37 PM  Clinical Narrative:    12/19 1645 Jenny [name] from hospice call back and after speaking with daughter, receiving the patient home after bed set up and education needed, she was too tired and asked if bed can be delivered but patient be discharged in am.   Daughter also taking care of father in hospice care.   Provider contacted and agreed to this.   Unit RN included in communication.   Will leave notes for weekday CM.  Jenny Davies RN CM  12/19 1630.   Jenny [name] from Hospice called and bed will be delivered to home around 8 pm.   She will contact daughter, Jenny [name], to see if that is okay as patient cannot be transported till bed in place in the home. Communicated information to Unit RN. Transport is [facility] is [phone]. Face sheet and Med. Nec. forms printed to unit earlier.   Patient is now full DNR as of 1247 06/1920.   RN confirmed yellow DNR signed on chart. Jenny [name]'s from hospice number is [phone] if CM is not signed in.    12/19 [facility] accepted referral and to see patient today. DME hospital bed ordered and Hospice agency will take this set up. Bed does need to be in place before transport to home per daughter who is also taking of father at home via hospice. VM left with Jenny [name] regarding bed DME and call back number if this is an issue. [facility] stated if they provide hospice will exchange for their own. Spoke with daughter at length and asked her to let CM know if she does not hear from Hospice this afternoon. [employer] discharge today to home with hospice care. Jenny Davies RN CM      Barriers to Discharge: Barriers Resolved  Expected Discharge Plan and Services           Expected Discharge Date: 06/24/20               DME Arranged:   (Hospice agency will arrange ordered DME hospital bed.) DME Agency:  [facility] will provide ordered DME.) Date DME Agency Contacted:  Jenny [name]) Time DME Agency Contacted: [phone] Representative spoke with at DME Agency: Jenny [name] (Discussed earlier but left VM for DME hospital bed.) HH Arranged:  (Home Hospice) [place]: Other - See comment ([place]) Date [place]: 06/24/20 Time HH Agency Contacted: 1100 Representative spoke with at [place]: Jenny [name], [place] ([place]) Interventions    Readmission Risk Interventions No flowsheet data found.

## 2020-06-24 NOTE — Progress Notes (Signed)
Full DNR written 06/24/20 at 1247.

## 2020-06-25 MED ORDER — LOSARTAN POTASSIUM 50 MG PO TABS: 50.0000 mg | ORAL_TABLET | Freq: Every day | ORAL | 0 refills | Status: AC

## 2020-06-25 NOTE — Care Management Important Message (Signed)
Important Message  Patient Details  Name: Jenny [name] MRN: [id number] Date of Birth: 1927-10-08   Medicare Important Message Given:  Yes     Juliann Pulse A Donnia Poplaski 06/25/2020, 10:18 AM

## 2020-06-25 NOTE — Progress Notes (Signed)
[facility] hospital Liaison note:  New referral for [facility] hospice services at home received on 12/19 from [facility]. Patient information had been given to referral. TOC Misty Green aware. Writer spoke via telephone this morning to patient's daughter Shauna Hugh to answer questions regarding hospice services and discuss discharge transportation. Plan is for dishcarge home today via nonemergent transport. Signed out of facility DNR in place to accompany patient at discharge. Thank you. Flo Shanks BSN, RN, [place]  [phone]

## 2020-06-25 NOTE — Progress Notes (Signed)
Patient seen and examined at the bedside this morning. She was hypertensive today. We added losartan on her home medication list. She was having some swallowing issues and was coughing while she was eating her breakfast. Daughter was at the bedside. I recommended daughter to keep upright on the bed while eating the food, take small bites at a time.  plan is to discharge her today to home with hospice. There is no other changes in her medical management. Discharge summary and orders already in place.

## 2020-06-25 NOTE — Progress Notes (Signed)
Pt transported home via EMS.  Pt going home on hospice.  Discharge packet given to EMS to give to daughter Diane at home. VSS

## 2020-07-10 ENCOUNTER — Other Ambulatory Visit: Payer: Medicare Other | Admitting: Nurse Practitioner

## 2020-10-16 ENCOUNTER — Telehealth: Payer: Self-pay | Admitting: *Deleted

## 2020-10-16 NOTE — Telephone Encounter (Signed)
Ok thanks 

## 2020-10-16 NOTE — Telephone Encounter (Signed)
FYI....   Pts daugther Luanna Cole stated that she wanted to cx all her mother's upcoming appts for now due to her being under [place] and would contact the office if anything changed

## 2020-12-12 ENCOUNTER — Other Ambulatory Visit: Payer: Medicare Other

## 2020-12-14 ENCOUNTER — Ambulatory Visit: Payer: Medicare Other

## 2020-12-14 ENCOUNTER — Ambulatory Visit: Payer: Medicare Other | Admitting: Oncology

## 2021-05-01 IMAGING — DX LEFT KNEE - 1-2 VIEW
2 series · 2 of 2 positions shown · non-contrast
Comparison: None.

CLINICAL DATA: Left leg pain following a twisting injury last
night.

EXAM:
LEFT KNEE - 1-2 VIEW

[knee ap]
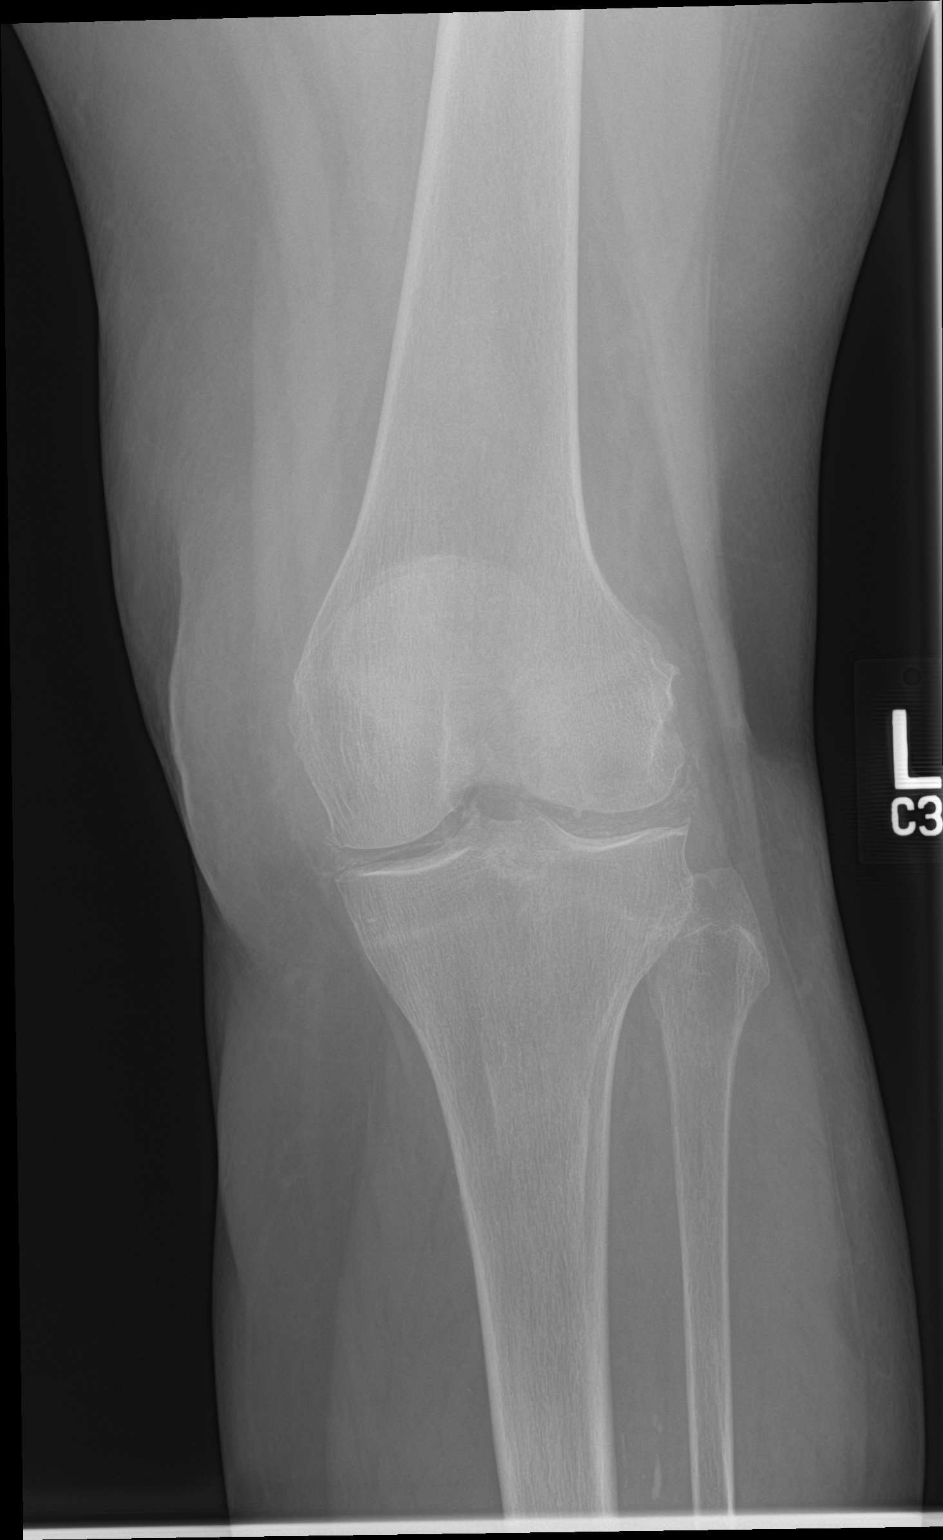

[knee lat]
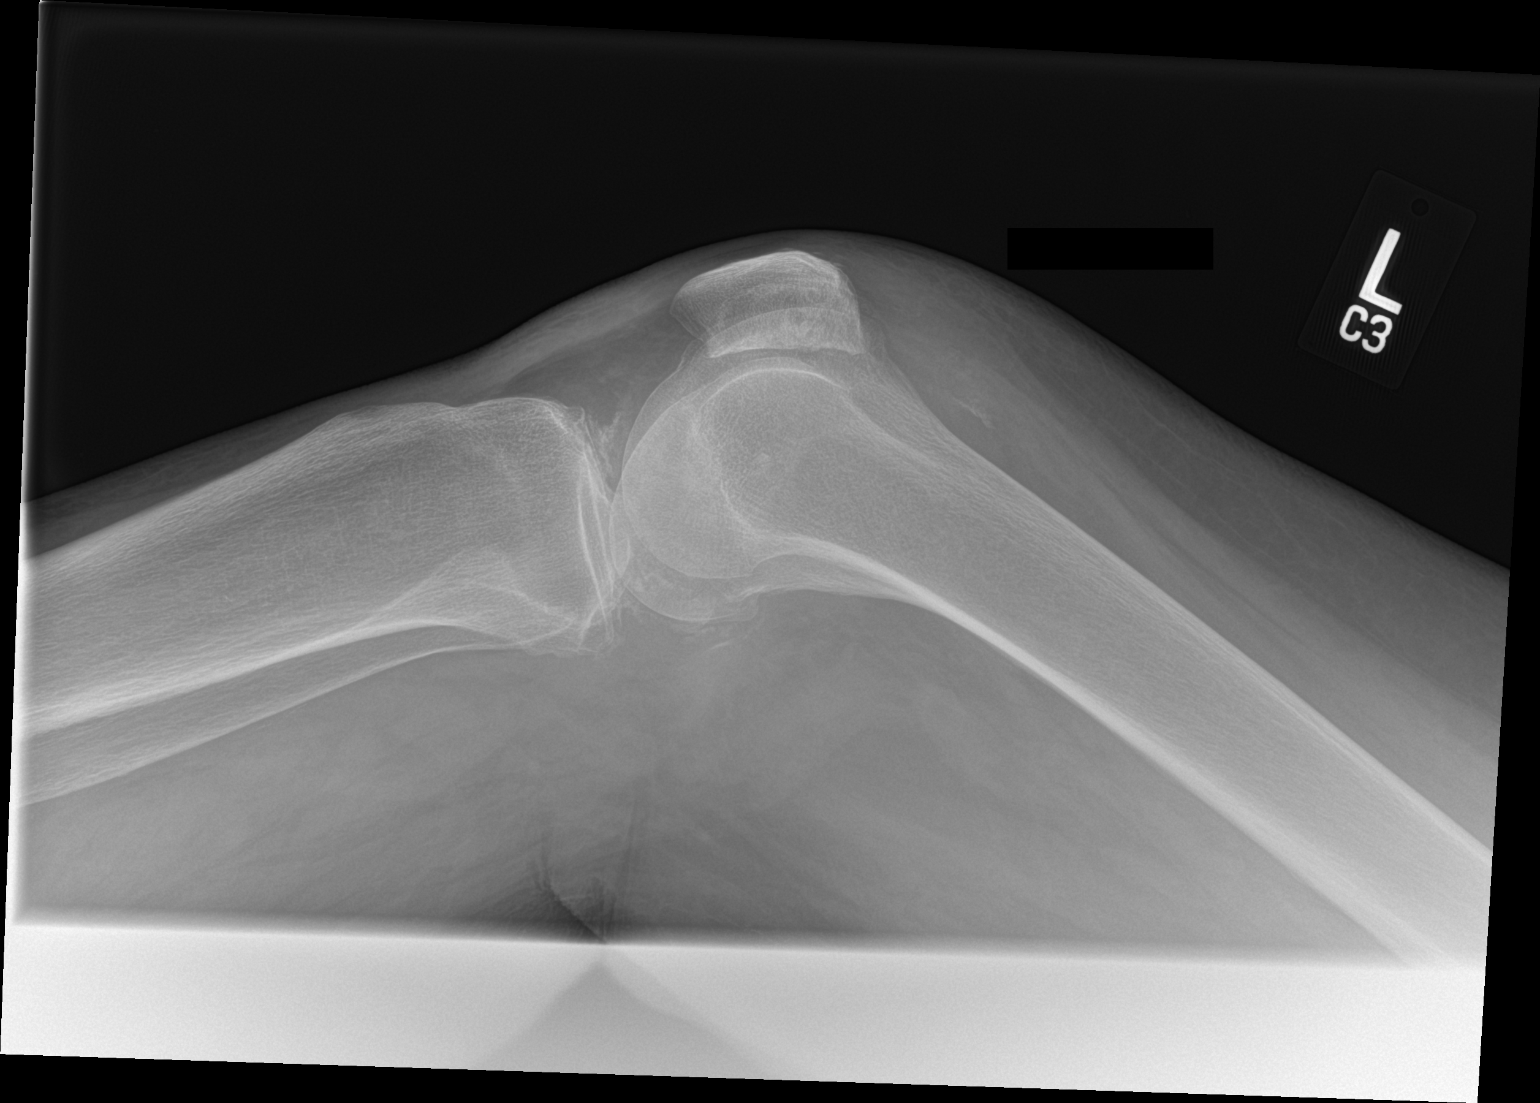

[2 of 2 positions shown; findings below may reference images not displayed]

FINDINGS: Medial and lateral meniscal calcifications. Mild spur formation
involving all 3 joint compartments. No fracture, dislocation or
effusion seen.
IMPRESSION: 1. No fracture.
2. Chondrocalcinosis and mild tricompartmental degenerative changes.

## 2021-10-15 ENCOUNTER — Telehealth: Payer: Self-pay | Admitting: Nurse Practitioner

## 2021-10-15 NOTE — Telephone Encounter (Signed)
Spoke with patient's daughter Luanna Cole as well as caregiver in the home Delcie Roch regarding the Palliative referral/services and all questions were answered and daughter was in agreement with beginning services with [place].  I have scheduled an In-home Consult for 10/22/21 @ 9 AM.   ?

## 2021-10-22 ENCOUNTER — Other Ambulatory Visit: Payer: Medicare Other | Admitting: Nurse Practitioner

## 2021-10-22 DIAGNOSIS — K5900 Constipation, unspecified: Secondary | ICD-10-CM

## 2021-10-22 DIAGNOSIS — R5381 Other malaise: Secondary | ICD-10-CM

## 2021-10-22 DIAGNOSIS — I6349 Cerebral infarction due to embolism of other cerebral artery: Secondary | ICD-10-CM

## 2021-10-22 DIAGNOSIS — Z515 Encounter for palliative care: Secondary | ICD-10-CM

## 2021-10-22 NOTE — Progress Notes (Signed)
? ? ?Manufacturing engineer ?Community Palliative Care Consult Note ?Telephone: [phone]  ?Fax: [phone]  ? ? ?Date of encounter: 10/22/21 ?4:21 PM ?PATIENT NAME: Jenny [name] ?[address] ?[place] [address]   ?[phone] (home)  ?DOB: 05-Oct-1927 ?MRN: [id number] ?PRIMARY CARE PROVIDER:    ?Derinda Late, MD,  ?[address] ?[place] [address] ?[phone] ? ?REFERRING PROVIDER:   ?Derinda Late, MD ?[address] [facility] ?[place],  [place] [address] ?[phone] ? ?RESPONSIBLE PARTY:    ?Contact Information   ? ? Name Relation Home Work Mobile  ? Luanna Cole Daughter   [phone]  ? Borski,Howard Son [phone]    ? Porfirio Mylar Daughter   [phone]  ? ?  ? ?I met face to face with patient in home. Palliative Care was asked to follow this patient by consultation request of  Derinda Late, MD to address advance care planning and complex medical decision making. This is a follow up visit.                                  ?ASSESSMENT AND PLAN / RECOMMENDATIONS:  ?Symptom Management/Plan: ?1. Advance Care Planning;  DNR ? ?2. Goals of Care: Goals include to maximize quality of life and symptom management. Our advance care planning conversation included a discussion about:    ?The value and importance of advance care planning  ?Exploration of personal, cultural or spiritual beliefs that might influence medical decisions  ?Exploration of goals of care in the event of a sudden injury or illness  ?Identification and preparation of a healthcare agent  ?Review and updating or creation of an advance directive document. ? ?3. Constipation, discussed bowel regimen currently, bowel pattern, progression of natural aging. We talked about starting mirlax OTC daily then senokot qam/qpm until regulated, then qhs. Increase fiber, H20, monitor bm's ? ?4. Debility secondary  to late onset CVA; Jenny [name]  is able to stand to pivot briefly, Continue to monitor for fall risk.  ? ?5. Palliative care encounter; Palliative care encounter; Palliative medicine team will continue to support patient, patient's family, and medical team. Visit consisted of counseling and education dealing with the complex and emotionally intense issues of symptom management and palliative care in the setting of serious and potentially life-threatening illness ? ?Follow up Palliative Care Visit: Palliative care will continue to follow for complex medical decision making, advance care planning, and clarification of goals. Return 4 weeks or prn. ? ?I spent 61 minutes providing this consultation. More than 50% of the time in this consultation was spent in counseling and care coordination. ?PPS: 40% ?Chief Complaint: Follow up palliative consult for complex medical decision making ? ?HISTORY OF PRESENT ILLNESS:  Jenny [name] is a 88 y.o. year old female  with multiple medical problems including Embolitic stroke, atrial fibrillation, cerebral artery disease, hypothyroidism, diverticulitis of colon, GERD, hyperlipidemia. Jenny [name] was recently d/c from hospice due to stability. Re-established PC for in person visit. I called Dianne, Ms. Kilfoyle daughter to confirm visit. We talked about updates, hospice involvement including d/c. We talked about ros, symptoms, caregivers. We talked about Jenny [name] cognitive and functional abilities. We talked about in home primary provider such as [facility] as it has become more difficult to get Jenny [name] to the primary office due to immobility. We talked about PC  visit. Medical goals reviewed. I visited caregiver and Ms [name] in her home. [name] was sitting in the recliner sleeping, awoke to verbal cues. Ms. Schloesser was able to answer few simple questions then returned to sleep. We talked about ros, symptoms, cognitive and functional abilities with caregiver. We talked at length about constipation with mirlax,  senokot, bowel pattern and new regimen. We talked about daily routine, friends that visit Jenny [name]. We talked about role pc in poc. We talked about overall progression with some intermit confusion. We talked about Ms [name] thinking the TV was in real time, she was part of the program such as riding a horse on the beach or whatever activity is being shown she is participating in. We talked about realistic expectations. We talked about redirecting, coping strategies. No behaviors. We talked about appetite, nutritional education done. Therapeutic listening, emotional support provided. Questions answered. Contact information given, scheduled f/u visit.  ? ?History obtained from review of EMR, discussion with Dianne daughter by phone, then in person with Ms Hennings, caregiver  ?I reviewed available labs, medications, imaging, studies and related documents from the EMR.  Records reviewed and summarized above.  ? ?ROS ?10 point system reviewed with Dianne daughter by phone, then in person with Ms Laye, caregiver all negative except HPI ? ?Physical Exam: ?Constitutional: NAD ?General: pleasantly confused female ?EYES: lids intact ?ENMT: oral mucous membranes moist ?CV: S1S2, RRR ?Pulmonary: LCTA, no increased work of breathing, no cough, room air ?Abdomen: normo-active BS + 4 quadrants, soft and non tender ?MSK: stand to pivot ?Skin: warm and dry ?Neuro:  + generalized weakness,  + cognitive impairment ?Psych: non-anxious affect, A and Oriented to self, place ?Thank you for the opportunity to participate in the care of Jenny [name].  The palliative care team will continue to follow. Please call our office at [phone] if we can be of additional assistance.  ? ?[place] Haub Z Jequan Shahin, NP  ? ?COVID-19 PATIENT SCREENING TOOL ?Asked and negative response unless otherwise noted:  ? ?Have you had symptoms of covid, tested positive or been in contact with someone with symptoms/positive test in the past 5-10 days? no  ?

## 2021-11-20 ENCOUNTER — Encounter: Payer: Self-pay | Admitting: Nurse Practitioner

## 2021-11-20 ENCOUNTER — Other Ambulatory Visit: Payer: Medicare Other | Admitting: Nurse Practitioner

## 2021-11-20 DIAGNOSIS — K5900 Constipation, unspecified: Secondary | ICD-10-CM

## 2021-11-20 DIAGNOSIS — F02818 Dementia in other diseases classified elsewhere, unspecified severity, with other behavioral disturbance: Secondary | ICD-10-CM

## 2021-11-20 DIAGNOSIS — R5381 Other malaise: Secondary | ICD-10-CM

## 2021-11-20 DIAGNOSIS — R451 Restlessness and agitation: Secondary | ICD-10-CM

## 2021-11-20 NOTE — Progress Notes (Signed)
? ? ?Manufacturing engineer ?Community Palliative Care Consult Note ?Telephone: [phone]  ?Fax: [phone]  ? ? ?Date of encounter: 11/20/21 ?9:59 AM ?PATIENT NAME: Jenny [name] ?[address] ?[place] [address]   ?[phone] (home)  ?DOB: 10/17/1927 ?MRN: [id number] ?PRIMARY CARE PROVIDER:   [facility] ?Jenny Late, MD,  ?[address] [facility] ?[place] [address] ?[phone] ?RESPONSIBLE PARTY:    ?Contact Information   ? ? Name Relation Home Work Mobile  ? Jenny [name] Daughter   [phone]  ? [name],Jenny Son [phone]    ? Jenny [name] Daughter   [phone]  ? ?  ? ?I met face to face with patient and family in home. Palliative Care was asked to follow this patient by consultation request of  Jenny Late, MD to address advance care planning and complex medical decision making. This is a follow up visit.                                  ?ASSESSMENT AND PLAN / RECOMMENDATIONS:  ?Symptom Management/Plan: ?ACP: DNR in place will revisit hospice as has been d/c due to stability when further declines ? ?2. Agitation/behaviors secondary to dementa, currently on Seroquel 79m qam, 250m2pm, 12.65m36mhs will Increase seroquel 37.5 mg (1 1/2 tabs) qam, 37.5 mg (1 1/2 tabs) at 2pm, (1 tablet) 265m90ms. Discussed with Jenny [name], daughter and caregiver JaniThayer Headingslength about worsening behaviors. Jenny [name] been ruminating more, more difficulty redirecting. Jenny [name] continued to have worsening delusions, hallucinations, thinking she is part of the TV shows she is watching, traveling all over the world. We talked about realistic expectations with progression of dementia. Education done. Support provided.  ? ?Rx; seroquel 37.65mg 49m; 37.65mg q63m; 265mg q80m#120; no rf ? ?3. Debility secondary to dementia, discussed safety, fall risk, continues to use walker to assist to pull up on to stand to pivot. We talked about overall decrease with  mobility, continues to sleep often at times 4 hours or more during the day then at night. Other nights Jenny. Missey iProvencherand down to the bathroom, recent UA done by [facility] with pending results. Does not appear to have UTI with symptoms, progression of dementia.  ? ?4. Constipation, discussed bowel regimen currently, bowel pattern, progression of natural aging. We talked about continuing mirlax OTC daily then senokot qam/qpm until regulated, then qhs. Increase fiber, H20, monitor bm's as helped to regulate BM's ?  ?5. Palliative care encounter; Palliative care encounter; Palliative medicine team will continue to support patient, patient's family, and medical team. Visit consisted of counseling and education dealing with the complex and emotionally intense issues of symptom management and palliative care in the setting of serious and potentially life-threatening illness ? ?Follow up Palliative Care Visit: Palliative care will continue to follow for complex medical decision making, advance care planning, and clarification of goals. Return 4 weeks or prn. ? ?I spent 61 minutes providing this consultation. More than 50% of the time in this consultation was spent in counseling and care coordination. ?PPS: 40% ?Chief Complaint: Follow up palliative consult for complex medical decision making ? ?HISTORY OF PRESENT ILLNESS:  Jenny iDugo3 y.o.36ear old female  with multiple medical problems including Embolitic stroke, atrial fibrillation, cerebral artery disease, hypothyroidism, diverticulitis of colon, GERD, hyperlipidemia. Jenny. Rom[place] wColclasurecently d/c from hospice  due to stability. Re-established PC for in person visit. I called Dianne, Jenny [name] daughter to confirm visit. I visited Jenny [name] with caregiver Jenny [name] in Jenny [name] home with video Jenny [name], daughter in for visit and discussion of poc. Reviewed ros, symptoms, constipation, agitation, worsening behaviors, ruminating, worsening cognitive impairment. We  talked about overall decline chronic progressive dementia. We talked about functional ability, no recent fall. We talked about medications. We talked about medical goals and implemented as plan describes. Therapeutic listening, emotional support provided. Questions answered, f/u pc visit scheduled.  ?History obtained from review of EMR, discussion with Dianne daughter by phone, then in person with Jenny [name], caregiver  ?I reviewed available labs, medications, imaging, studies and related documents from the EMR.  Records reviewed and summarized above.  ?  ?ROS ?10 point system reviewed with Dianne daughter by phone, then in person with Jenny Martone, caregiver all negative except HPI ?  ?Physical Exam: ?Constitutional: NAD ?General: pleasantly confused female ?EYES: lids intact ?ENMT: oral mucous membranes moist ?CV: S1S2, RRR ?Pulmonary: LCTA, no increased work of breathing, no cough, room air ?Abdomen: normo-active BS + 4 quadrants, soft and non tender ?MSK: stand to pivot ?Skin: warm and dry ?Neuro:  + generalized weakness,  + cognitive impairment ?Psych: non-anxious affect, A and Oriented to self, place ?Thank you for the opportunity to participate in the care of Jenny. Jenny [name].  The palliative care team will continue to follow. Please call our office at [phone] if we can be of additional assistance.  ? ? Z , NP  ? ?COVID-19 PATIENT SCREENING TOOL ?Asked and negative response unless otherwise noted:  ? ?Have you had symptoms of covid, tested positive or been in contact with someone with symptoms/positive test in the past 5-10 days?  NO ?

## 2021-12-17 ENCOUNTER — Other Ambulatory Visit: Payer: Medicare Other | Admitting: Nurse Practitioner

## 2021-12-17 ENCOUNTER — Encounter: Payer: Self-pay | Admitting: Nurse Practitioner

## 2021-12-17 DIAGNOSIS — K5900 Constipation, unspecified: Secondary | ICD-10-CM

## 2021-12-17 DIAGNOSIS — R5381 Other malaise: Secondary | ICD-10-CM

## 2021-12-17 DIAGNOSIS — Z515 Encounter for palliative care: Secondary | ICD-10-CM

## 2021-12-17 DIAGNOSIS — F02818 Dementia in other diseases classified elsewhere, unspecified severity, with other behavioral disturbance: Secondary | ICD-10-CM

## 2021-12-17 DIAGNOSIS — I6349 Cerebral infarction due to embolism of other cerebral artery: Secondary | ICD-10-CM

## 2021-12-17 DIAGNOSIS — R451 Restlessness and agitation: Secondary | ICD-10-CM

## 2021-12-17 NOTE — Progress Notes (Signed)
  [facility] Consult Note Telephone: [phone]  Fax: [phone]    Date of encounter: 12/17/21 12:52 PM PATIENT NAME: Jenny Jenny [name] [address] [place] [address]   [phone] (home)  DOB: 02/07/1928 MRN: [id number] PRIMARY CARE PROVIDER:    [facility]  RESPONSIBLE PARTY:    Contact Information     Name Relation Home Work Mobile   Jenny Jenny [name], Jenny Jenny [name]   [phone]   Jenny Jenny [name],Jenny Jenny [name] Son [phone]     Jenny Jenny [name], Jenny Jenny [name] Jenny [name]   [phone]      I met face to face with patient in facility. Palliative Care was asked to follow this patient by consultation request of  [facility] to address advance care planning and complex medical decision making. This is a follow up visit.                                  ASSESSMENT AND PLAN / RECOMMENDATIONS:  Symptom Management/Plan: ACP: DNR in place will revisit hospice as has been d/c due to stability when further declines   2. Agitation/behaviors secondary to dementa, currently on  Currently receiving seroquel 37.5mg qam 37.5mg at 2pm 25mg qhs Was increased at last PC visit 11/20/2021 I talked with Jenny [name], Jenny Williams NP/[facility] and Jenny Jenny [name] about d/c seroquel since having worsening hallucinations, more vivid, scary at times. Discussed d/c seroquel and trying olazapine. All in agreement, discussed medication stop seroquel; start olazapine. We talked about sending rx in with Jenny Wiliams NP/[facility] has a scheduled visit next Wed for f/u. May also consider looking at Buspar if effective All in agreement.  Rx: Olazapine 2.5mg tid; #90; No RF Will have f/u visit prior to end of rx.    3. Debility secondary to dementia, discussed safety, fall risk, continues to use walker to assist to pull up on to stand to pivot. We talked about overall decrease with mobility, Jenny Jenny [name] Jenny [name] they have been going outside as she does push her in w/c.    4.  Constipation, discussed bowel regimen currently, bowel pattern, progression of natural aging. We talked about continuing mirlax OTC daily then senokot qam/qpm until regulated, then qhs. Increase fiber, H20, monitor bm's as helped to regulate BM's  5. Nail avulsion; discussed with recent visit to podiatry, assessed for infection without any indication. Education done on s/s infection. Continue with OP podiatry visits    6. Palliative care encounter; Palliative care encounter; Palliative medicine team will continue to support patient, patient's family, and medical team. Visit consisted of counseling and education dealing with the complex and emotionally intense issues of symptom management and palliative care in the setting of serious and potentially life-threatening illness   Follow up Palliative Care Visit: Palliative care will continue to follow for complex medical decision making, advance care planning, and clarification of goals. Return 4 weeks or prn.   I spent 63 minutes providing this consultation. More than 50% of the time in this consultation was spent in counseling and care coordination. PPS: 40% Chief Complaint: Follow up palliative consult for complex medical decision making   HISTORY OF PRESENT ILLNESS:  Jenny Jenny [name] is a 88 y.o. year old female  with multiple medical problems including Embolitic stroke, atrial fibrillation, cerebral artery disease, hypothyroidism, diverticulitis of colon, GERD, hyperlipidemia. Jenny Jenny [name] was recently d/c from hospice due to stability. I called Jenny [name] Jenny Jenny [name] to confirm in home fu pc visit in   Jenny Jenny [name] home. Jenny Jenny [name] in agreement. I visited Jenny Jenny [name] as she was sitting in the recliner with her feet elevated. Jenny Jenny [name] recent visit to podiatry with nail has now come off on left great toe. We talked about care. We talked about s/s infection. Reviewed ros, symptoms, constipation, agitation, worsening behaviors, more visual hallucinations with increase  in seroquel, continues worsening cognitive impairment. We talked about overall decline chronic progressive dementia. We talked about stopping seroquel and starting olazapine. We talked about functional ability, no recent fall. We talked about medical goals and implemented as plan describes. We talked about appetite which continues to be good. We talked about quality of life, daily routine. I talked with Jenny [name], Jenny Williams NP/[facility] and Jenny Jenny [name] about d/c seroquel since having worsening hallucinations, more vivid, scary at times. Discussed d/c seroquel and trying olazapine. All in agreement, discussed medication stop seroquel; start olazapine. We talked about sending rx in with Jenny Wiliams NP/[facility] has a scheduled visit next Wed for f/u. May also consider looking at Buspar if effective. Therapeutic listening, emotional support provided. Questions answered, f/u pc visit scheduled.  History obtained from review of EMR, discussion with Jenny Jenny [name] by phone, then in person with Jenny Jenny [name], Jenny [name]  I reviewed available labs, medications, imaging, studies and related documents from the EMR.  Records reviewed and summarized above.    ROS 10 point system reviewed with Jenny Jenny [name] by phone, then in person with Jenny Jenny [name], Jenny [name] all negative except HPI   Physical Exam: Constitutional: NAD General: pleasantly confused female EYES: lids intact ENMT: oral mucous membranes moist CV: S1S2, RRR Pulmonary: LCTA, no increased work of breathing, no cough, room air Abdomen: normo-active BS + 4 quadrants, soft and non tender MSK: stand to pivot Skin: warm and dry Neuro:  + generalized weakness,  + cognitive impairment Psych: non-anxious affect, A and Oriented to self, place  Thank you for the opportunity to participate in the care of Jenny Jenny [name].  The palliative care team will continue to follow. Please call our office at [phone] if we can be of additional assistance.     Z , NP   COVID-19 PATIENT SCREENING TOOL Asked and negative response unless otherwise noted:   Have you had symptoms of covid, tested positive or been in contact with someone with symptoms/positive test in the past 5-10 days? no  

## 2021-12-20 ENCOUNTER — Telehealth: Payer: Self-pay

## 2021-12-20 NOTE — Telephone Encounter (Signed)
909 am.  Phone call made to schedule a home visit for July at the request of Natalia Leatherwood, NP.  No answer.  Message left requesting a callback.

## 2021-12-27 ENCOUNTER — Other Ambulatory Visit: Payer: Medicare Other | Admitting: Nurse Practitioner

## 2021-12-27 ENCOUNTER — Encounter: Payer: Self-pay | Admitting: Nurse Practitioner

## 2021-12-27 DIAGNOSIS — R451 Restlessness and agitation: Secondary | ICD-10-CM

## 2021-12-27 DIAGNOSIS — Z515 Encounter for palliative care: Secondary | ICD-10-CM

## 2021-12-27 DIAGNOSIS — I6349 Cerebral infarction due to embolism of other cerebral artery: Secondary | ICD-10-CM

## 2021-12-27 DIAGNOSIS — R5381 Other malaise: Secondary | ICD-10-CM

## 2021-12-27 DIAGNOSIS — K5900 Constipation, unspecified: Secondary | ICD-10-CM

## 2021-12-27 DIAGNOSIS — F02818 Dementia in other diseases classified elsewhere, unspecified severity, with other behavioral disturbance: Secondary | ICD-10-CM

## 2021-12-27 NOTE — Progress Notes (Signed)
Therap[facility], [facility] Palliative Care Consult Note Telephone: [phone]  Fax: [phone]    Date of encounter: 12/27/21 1:19 PM PATIENT NAME: [place] [address] [place] [address]   [phone] (home)  DOB: 1927/07/23 MRN: [id number] PRIMARY CARE PROVIDER:    Kandyce Rud, MD,  [address] [facility] [place] [address] [phone]  REFERRING PROVIDER:  [facility] Kandyce Rud, MD [phone] [address] [facility] [place],  [place] [address] [phone]  RESPONSIBLE PARTY:    Contact Information     Name Relation Home Work Valentine, Sedalia Muta Daughter   [phone]   Yarelie, Burky [phone]     Elenore Rota Daughter   [phone]      Due to the COVID-19 crisis, this visit was done via telemedicine from my office and it was initiated and consent by this patient and or family.  I connected with Liborio Nixon, caregiver with  [facility] on 12/27/21 by telephone as video not available enabled telemedicine application and verified that I am speaking with the correct person using two identifiers.   I discussed the limitations of evaluation and management by telemedicine. The patient expressed understanding and agreed to proceed. Palliative Care was asked to follow this patient by consultation request of  [facility] address advance care planning and complex medical decision making. This is a follow up visit.                                  ASSESSMENT AND PLAN / RECOMMENDATIONS:  Symptom Management/Plan: ACP: DNR in place will revisit hospice as has been d/c due to stability when further declines   2. Agitation/behaviors secondary to dementa, currently on  Continue Olazapine, continue to monitor behaviors, supportive, symptom management.   3. Debility secondary to dementia/late onset CVA, discussed safety, fall risk, continues to  use walker to assist to pull up on to stand to pivot. We talked about overall decrease with mobility, Liborio Nixon caregiver endorses they have been going outside as she does push her in w/c.    4. Constipation, improved. discussed bowel regimen currently, bowel pattern, progression of natural aging. We talked about continuing mirlax OTC daily then senokot qam/qpm until regulated, then qhs. Increase fiber, H20, monitor bm's as helped to regulate BM's   5. Palliative care encounter; Palliative care encounter; Palliative medicine team will continue to support patient, patient's family, and medical team. Visit consisted of counseling and education dealing with the complex and emotionally intense issues of symptom management and palliative care in the setting of serious and potentially life-threatening illness   Follow up Palliative Care Visit: Palliative care will continue to follow for complex medical decision making, advance care planning, and clarification of goals. Return 4 weeks or prn. I spent 31 minutes providing this consultation. More than 50% of the time in this consultation was spent in counseling and care coordination. PPS: 40% Chief Complaint: Follow up palliative consult for complex medical decision making   HISTORY OF PRESENT ILLNESS:  [place] P Marko is a 88 y.o. year old female  with multiple medical problems including Embolitic stroke, atrial fibrillation, cerebral artery disease, hypothyroidism, diverticulitis of colon, GERD, hyperlipidemia. Ms. Neitzke was recently d/c from hospice due to stability. I called caregiver Liborio Nixon to discuss initiation of Olazapine. We talked about ros, symptoms, functional  and cognitive changes. Liborio Nixon endorses Ms. Mathison had a bed day last weekend then slowly improved, we talked about Saddie Benders which seems to be better than seroquel. We talked about medical goals to continue with Olazapine, monitor behaviors, fall risk as she is a total life. Appetite same, no other  changes. Continue current plan of care. Therapeutic listening, emotional support provided. Questions answered, f/u pc visit scheduled.  History obtained from review of EMR, discussion with caregiver with Ms. Panella I reviewed available labs, medications, imaging, studies and related documents from the EMR.  Records reviewed and summarized above.    ROS 10 point system reviewed with Dianne daughter by phone, then in person with Ms Solinsky, caregiver all negative except HPI   Physical Exam: deferred  Thank you for the opportunity to participate in the care of Ms. Marmol.  The palliative care team will continue to follow. Please call our office at [phone] if we can be of additional assistance.   Maston Wight Prince Rome, NP

## 2022-03-06 ENCOUNTER — Other Ambulatory Visit: Payer: Medicare Other | Admitting: Nurse Practitioner

## 2022-03-21 ENCOUNTER — Other Ambulatory Visit: Payer: Medicare Other | Admitting: Nurse Practitioner

## 2022-03-21 ENCOUNTER — Encounter: Payer: Medicare Other | Admitting: Nurse Practitioner

## 2022-03-21 ENCOUNTER — Encounter: Payer: Self-pay | Admitting: Nurse Practitioner

## 2022-03-21 DIAGNOSIS — I6349 Cerebral infarction due to embolism of other cerebral artery: Secondary | ICD-10-CM

## 2022-03-21 DIAGNOSIS — R451 Restlessness and agitation: Secondary | ICD-10-CM

## 2022-03-21 DIAGNOSIS — Z515 Encounter for palliative care: Secondary | ICD-10-CM

## 2022-03-21 DIAGNOSIS — K5900 Constipation, unspecified: Secondary | ICD-10-CM

## 2022-03-21 DIAGNOSIS — R5381 Other malaise: Secondary | ICD-10-CM

## 2022-03-21 DIAGNOSIS — F02818 Dementia in other diseases classified elsewhere, unspecified severity, with other behavioral disturbance: Secondary | ICD-10-CM

## 2022-03-21 NOTE — Progress Notes (Signed)
[facility] Palliative Care Consult Note Telephone: [phone]  Fax: [phone]    Date of encounter: 03/21/22 2:02 PM PATIENT NAME: [place] [address] [place] [address]   [phone] (home)  DOB: [birth year] MRN: [id number] PRIMARY CARE PROVIDER:    Derinda Late, MD,  [address] [facility] and [facility] Medicine [place] [address] [phone]  RESPONSIBLE PARTY:    Contact Information     Name Relation Home Work Dover, Shauna Hugh Daughter   [phone]   Nelva, Hauk [phone]     Porfirio Mylar Daughter   [phone]      I met face to face with patient and caregiver in home. Palliative Care was asked to follow this patient by consultation request of  Derinda Late, MD to address advance care planning and complex medical decision making. This is a follow up visit.                                  ASSESSMENT AND PLAN / RECOMMENDATIONS:  Symptom Management/Plan: ACP: DNR in place will revisit hospice as has been d/c due to stability when further declines   2. Agitation/behaviors secondary to dementa, improved with olazapine. Some behaviors that require redirection though not as resistant and more cooperative with caregivers.     3. Debility secondary to dementia, discussed safety, fall risk, continues to use walker to assist to pull up on to stand to pivot. We talked about overall decrease with mobility, continues to work with PT/OT, exercises daily with caregivers. Fall risk, no recent falls. Caregiver endorses they are now using the lift to get her up from bed to w/c, working out well.    4. Constipation, uncontrolled; no bm in 8 days; discussed bowel regimen currently, bowel pattern, progression of natural aging. We talked about continuing mirlax OTC daily then 2 senokot qam/qpm until regulated, then 1 tablet bid. Increase fiber, H20, monitor bm's as helped to regulate BM's. Discussed  +BS; will also give 1/2 bottle mag citrate if no results after 1 hr give second 1/2 bottle. Discussed importance of keeping regular, rx's sent by escribe  Rx; Senna plus; 2 tablets bid until regulated then 1 tablet po bid; #120; 3 RF  Rx: Mag citrate; 1 bottle; #1; No RF   5. Palliative care encounter; Palliative care encounter; Palliative medicine team will continue to support patient, patient's family, and medical team. Visit consisted of counseling and education dealing with the complex and emotionally intense issues of symptom management and palliative care in the setting of serious and potentially life-threatening illness   Follow up Palliative Care Visit: Palliative care will continue to follow for complex medical decision making, advance care planning, and clarification of goals. Return 4 weeks or prn.   I spent 44 minutes providing this consultation. More than 50% of the time in this consultation was spent in counseling and care coordination. PPS: 40% Chief Complaint: Follow up palliative consult for complex medical decision making   HISTORY OF PRESENT ILLNESS:  [place] P Louks is a 88 y.o. year old female  with multiple medical problems including Embolitic stroke, atrial fibrillation, cerebral artery disease, hypothyroidism, diverticulitis of colon, GERD, hyperlipidemia. Ms. Liberman was recently d/c from hospice due to stability. I called to confirm PC f/u; visit with caregiver. I visited Ms. Boodram who was sitting in the w/c moving the chair with her  feet and caregiver. We talked about how she has been doing. We talked about ros, symptoms including agitation with olazapine helping. Discussed sleep patterns, hygiene. We talked about appetite, working with speech smaller meals to decrease distractions while eating. Ms. Sachs does eat very fast with possible aspiration. We talked about Ms. Stoneberg working with PT/OT, exercises. We talked about constipation at length, bowel regimen, nutrition  education, increase oral intake, medications reviewed, medical goals reviewed. Currently no noted change in clothes size. We talked about behaviors with ongoing hallucinations, delusions though have improved, not as scary for Ms. Mcmiller. Caregiver endorses appears like she continues to have TIA's, 3 times with s/s not any deficits with episodes lasting only a few minutes over the last few months. Wishes are for focus to be on comfort care. No wishes for interventions except comfort care at home. Will re-visit hospice when decline, meets criteria. We talked about role pc in poc, discussed f/u visit, scheduled. Therapeutic listening, emotional support provided. Questions answered.   ROS 10 point system reviewed with caregiver in person with Ms Menard, caregiver all negative except HPI   Physical Exam: Constitutional: NAD General: pleasantly confused female EYES: lids intact ENMT: oral mucous membranes moist CV: S1S2, RRR Pulmonary: LCTA, no increased work of breathing, no cough, room air Abdomen: normo-active BS + 4 quadrants, soft and non tender MSK: stand to pivot Skin: warm and dry Neuro:  + generalized weakness,  + cognitive impairment Thank you for the opportunity to participate in the care of Ms. Mcelhannon.  The palliative care team will continue to follow. Please call our office at [phone] if we can be of additional assistance.   Cortlandt Capuano Ihor Gully, NP

## 2022-03-24 NOTE — Progress Notes (Signed)
This encounter was created in error - please disregard.

## 2022-06-04 ENCOUNTER — Other Ambulatory Visit: Payer: Medicare Other

## 2022-06-04 VITALS — BP 130/72 | HR 92 | Temp 97.7°F

## 2022-06-04 DIAGNOSIS — Z515 Encounter for palliative care: Secondary | ICD-10-CM

## 2022-06-04 NOTE — Progress Notes (Signed)
PATIENT NAME: Jenny [name] DOB: 03/07/1928 MRN: [id number]  PRIMARY CARE PROVIDER: Derinda Late, MD  RESPONSIBLE PARTY:  Acct ID - Guarantor Home Phone Work Phone Relationship Acct Type  [id number] [name], [name]  Self P/F     [address], [place], [place] [address]   Appetite: Remains good.  Janice-caregiver believes there has been some weight loss as clothing is fitting looser in the waist.    Functional Status:  Patient is ambulatory with a rolling walker.  She continues to have [facility] coming in weekly for maintenance.  No recent falls.  Patient is able to feed herself finger foods but is having a harder time navigating the spoon.  She is mostly continent of bowel and bladder.  Patient is toileted every 2 hours and will often tell her caregivers if she needs to void.  She is wearing a depends for protection.  Conversation is mostly non-sensical.  On occasion, patient is able to make a clear sentence.   Hallucinations:  Per caregiver [name] patient continues to have hallucinations and often thinks there is a baby that needs to be checked.  What she watches on television becomes reality to patient.  They are keeping the channel mostly on [employer] or the weather per Marcie Bal.  Pain:  Patient is able to report pain to her bilateral feet.  She is taking Tylenol and Gabapentin routinely.  Per [name] this has been helpful. UTI:  No current symptoms.  Patient was treated with antibiotics a couple of weeks ago.  Spoke with daughter Shauna Hugh and updated her on changes in the [facility] program.  Daughter is agreeable to visits 2x monthly.  Next visit scheduled for 12/13 @ 930 am.     CODE STATUS: DNR  ADVANCED DIRECTIVES: Yes MOST FORM: Yes PPS: 40%   PHYSICAL EXAM:   VITALS: Today's Vitals   06/04/22 1501  BP: 130/72  Pulse: 92  Temp: 97.7 F (36.5 C)  SpO2: 96%    LUNGS: clear to auscultation  CARDIAC: Cor RRR}  EXTREMITIES: - for edema SKIN: Skin color, texture, turgor  normal. No rashes or lesions or mobility and turgor normal  NEURO: positive for gait problems and memory problems       Lorenza Burton, RN

## 2022-06-18 ENCOUNTER — Other Ambulatory Visit: Payer: Medicare Other

## 2022-06-18 VITALS — BP 128/72 | HR 70 | Temp 97.5°F

## 2022-06-18 DIAGNOSIS — Z515 Encounter for palliative care: Secondary | ICD-10-CM

## 2022-06-18 NOTE — Progress Notes (Signed)
PATIENT NAME: Jenny [name] DOB: Sep 12, 1927 MRN: [id number]  PRIMARY CARE PROVIDER: Derinda Late, MD  RESPONSIBLE PARTY:  Acct ID - Guarantor Home Phone Work Phone Relationship Acct Type  [id number] CARLENA, [phone]  Self P/F     [address], [place], [place] [address]   Functional Status:  Decline since my last visit.  Patient is using the hoyer lift or pivot transfer over the last week due to weakness.   Requiring more cues to complete tasks.  Caregivers are doing grooming, dressing and showers.  She toileting every 2 hours by caregivers.   Continues with home health therapy 1x weekly.   Respiratory:  Currently being treated for aspiration pneumonia.  Was started on Levaquin on 06/06/22 and now on azithromycin for 10 years.   Now doing albuterol nebs every 4 hours.  Caregivers reports increase wheezing between treatments. Patient with a non-productive cough.   Caregivers noted increase coughing with food and liquids.  Now eating softer foods.  Education provided on thickened liquids. Caregiver reports chest x-ray was ordered but has not been completed.  Phone call made to [place] and order is still pending.  Spoke with Pecola Lawless who will follow up with x-ray company.  Oxygen ordered by PCP but has not arrived.  Phone call made to [employer] and delivery is scheduled for today.    TIA:  Janet-caregiver notes TIA symptoms 2 weeks ago.  Patient was unable to communicate and starring.  Patient slept for awhile and then returned to baseline.   Weight Loss:  Clothes are looser fitting in the waist per caregivers.  Decrease in appetite over the last couple of days.   Follow up visit scheduled for next week for re-check.   CODE STATUS: DNR-form in the home ADVANCED DIRECTIVES: Yes MOST FORM: Yes PPS: 40% weak   PHYSICAL EXAM:   VITALS: Today's Vitals   06/18/22 0928  BP: 128/72  Pulse: 70  Temp: (!) 97.5 F (36.4 C)  SpO2: 92%    LUNGS: expiratory wheezes  bilaterally CARDIAC: Cor irreg, irreg RRR}  EXTREMITIES: - for edema SKIN: Skin color, texture, turgor normal. No rashes or lesions or mobility and turgor normal  NEURO: positive for memory problems       Lorenza Burton, RN

## 2022-06-23 ENCOUNTER — Other Ambulatory Visit: Payer: Medicare Other

## 2022-06-23 DIAGNOSIS — Z515 Encounter for palliative care: Secondary | ICD-10-CM

## 2022-06-23 NOTE — Progress Notes (Signed)
PATIENT NAME: Jenny [name] DOB: 12-03-1927 MRN: [id number]  PRIMARY CARE PROVIDER: Derinda Late, MD  RESPONSIBLE PARTY:  Acct ID - Guarantor Home Phone Work Phone Relationship Acct Type  [id number] [name], [name]  Self P/F     [address], [place], [place] [address]    I connected with  Carol-caregiver for [facility] on 06/23/22 by telephone and verified that I am speaking with the correct person using two identifiers.   I discussed the limitations of evaluation and management by telemedicine. The patient expressed understanding and agreed to proceed.   Connected with Carol-private caregiver to follow up on patient status.  She advised chest x-ray still has not been completed.  [facility] nurse Larena Glassman is scheduled for a home visit today.  Patient continues with a deep cough.  No fever reported.  Caregivers continue to use a hoyer lift as patient is unable to transfer.  No changes in po intake.    Follow up with Larena Glassman at [facility] to let her know x-ray has not been completed.  Will follow up with RN after visit to see how we can assist or if hospice referral is needed.  Currently follow up visit is scheduled with PC on Wednesday.    Lorenza Burton, RN

## 2022-06-24 ENCOUNTER — Telehealth: Payer: Self-pay

## 2022-06-24 NOTE — Telephone Encounter (Signed)
9 am.  Follow up call made to check on patient condition.  Spoke with Marcie Bal who advised CXR was completed yesterday but they have not heard about results yet.  Coughing remains but wheezing has improved.  Continues with albuterol nebs q 4.   Antibiotics are now completed.  Po intake has been good over the weekend.  Visit scheduled for tomorrow morning to re-assess patient status.

## 2022-06-25 ENCOUNTER — Other Ambulatory Visit: Payer: Medicare Other

## 2022-06-25 VITALS — BP 170/100 | HR 70 | Temp 97.8°F

## 2022-06-25 DIAGNOSIS — Z515 Encounter for palliative care: Secondary | ICD-10-CM

## 2022-06-25 NOTE — Progress Notes (Signed)
PATIENT NAME: Jenny [name] DOB: [birth year] MRN: [id number]  PRIMARY CARE PROVIDER: Derinda Late, MD  RESPONSIBLE PARTY:  Acct ID - Guarantor Home Phone Work Phone Relationship Acct Type  [id number] NEREYDA, [id number] [phone]  Self P/F     [address], [place], [place] [address]   Appetite/Dysphagia:  slight decline in overall intake. Caregiver feels clothing is looser fitting in the waist now.  Left mid-arm circumference is 33 cm today.  Eating softer foods now and more breakfast type foods.  Coughing noted with thin liquids.  Fever:  No fever today.  Caregiver reports some night sweats as patient's clothing is moist in the am.  This is new since respiratory issues started.  They are administering tylenol at bedtime.  Hypertension:  elevated bp noted today.  Phone call made to [facility] to notify PCP of bp readings.  Message left with readings and request for Cymbalta and Buspirone to be refilled.  Headache reported last Friday but none this week.  Patient was able to verbalize feeling slightly dizzy. Blood pressure checked at the end of this visit and reading is 158/96.  Home Health:  Patient continues with PT weekly.  She was able to ambulate with the therapist from her bedroom to the living room with a rolling walker and one person assistance.  Caregiver reports patient was very fatigued after exercise was completed.   Respiratory:  Chest x-ray completed this week. New dx double pneumonia.  Has been treated with Levaquin, Azithromycin and now Cefdinir this month.   Completed last dose of prednisone today. Continues with breathing treatment q 4 hours.  Is now oxygen dependent at 2 L via [place].  Oxygen saturation is fluctuating from 88-92% on 2 L.   Weakness:  Caregivers are mostly using a hoyer lift to transfer patient from the bed to her wheelchair.  At times, she is able to do a pivot transfer with 2 person assistance.   Phone call made to daughter Diane to discuss visit and hospice.   No answer.  Message left.   Connected with Larena Glassman, RN with [facility] regarding hospice.  She advised NP is okay with hospice referral if patient does not improve after current treatment of antibiotics.    CODE STATUS: DNR ADVANCED DIRECTIVES: Yes MOST FORM: No PPS: 30%   PHYSICAL EXAM:   VITALS: Today's Vitals   06/25/22 0949  Pulse: 70  SpO2: 92%    LUNGS:  right upper lobe with expiratory wheezes present.  Diminished to right lower lobe and left lower lobe with rhonchi present.  CARDIAC: Cor irreg, irreg RRR}  EXTREMITIES: - for edema SKIN: Skin color, texture, turgor normal. No rashes or lesions or mobility and turgor normal  NEURO: positive for gait problems, memory problems, and weakness       Lorenza Burton, RN

## 2022-06-27 ENCOUNTER — Telehealth: Payer: Self-pay

## 2022-06-27 NOTE — Telephone Encounter (Signed)
1030 am.  Spoke with caregiver [name] regarding blood pressures.  Today's reading is 143/98 pulse 78.  Instructions provided on amlodipine increase from [place], Therapist, sports at [facility] [employer].  [name] is aware to administer a total of 5 mg daily and check blood pressures daily.  If blood pressures remain elevated caregiver has been instructed to call [facility].

## 2022-07-09 ENCOUNTER — Other Ambulatory Visit: Payer: Medicare Other

## 2022-07-09 VITALS — BP 132/90 | HR 99 | Temp 97.5°F

## 2022-07-09 DIAGNOSIS — Z515 Encounter for palliative care: Secondary | ICD-10-CM

## 2022-07-09 NOTE — Progress Notes (Signed)
PATIENT NAME: Jenny [name] DOB: June 18, 1928 MRN: [id number]  PRIMARY CARE PROVIDER: Derinda Late, MD  RESPONSIBLE PARTY:  Acct ID - Guarantor Home Phone Work Phone Relationship Acct Type  [id number] [name], [name]  Self P/F     [address], [place], [place] [address]   Dysphagia:  Eating softer foods now.  No coughing observed with eating or drinking per aide.   HTN:  Amlodipine increased to 5 mg last month.  See bp readings below.  Private duty aides continue to obtain daily readings.  No complaints of dizziness noted by caregivers. Phone call made to [facility] to follow up on refill order.  Unable to speak with staff so message has been left requesting a call back.  Call back received from [facility] they have sent a clarification to Dr. Baldemar Lenis.  Advised patient is now being followed by [facility].  Pharmacy will sent request to them.   07/02/22 146/80 07/03/22 128/74 07/04/22 124/68 07/05/22 120/68 07/06/22 125/72 07/08/22 108/65  Home Health Health:  Continues with PT.  Now under restorative care with [facility] and receiving OT/ST/Nsg.   Pneumonia:  Now completed antibiotics.  Was also treated for vaginal yeast last month.  Continues with breathing treatments every 6 hours ATC.   No shortness of breath noted on this visit. Continues with cough but no sputum production.  Oxygen remains in place at this time.  O2 sats are dropping into the upper 80's with exertion and oxygen in place.   Weakness:  Caregiver [name] reports increase weakness today.  Patient was unable to hold herself upright on the edge of the bed.  She is able to ambulate short distances to the bathroom but is noted to be very fatigued per [name].   Communication:  Phone call made to daughter Diane to follow up on my visit today and and discuss any questions/concerns.  No answer.  Message left.   CODE STATUS: DNR form in the home. ADVANCED DIRECTIVES: Yes MOST FORM:Yes PPS: 40%  (weak)   PHYSICAL EXAM:   VITALS: Today's Vitals   07/09/22 0932  BP: (!) 132/90  Pulse: 99  Temp: (!) 97.5 F (36.4 C)  SpO2: 91%    LUNGS:  Rhonchi present to left lower lobe CARDIAC: Cor Irreg EXTREMITIES:- for edema SKIN: Skin color, texture, turgor normal. No rashes or lesions or mobility and turgor normal  NEURO: positive for gait problems and memory problems       Lorenza Burton, RN

## 2022-07-24 ENCOUNTER — Other Ambulatory Visit: Payer: Medicare Other

## 2022-07-24 VITALS — BP 116/84 | HR 73 | Temp 97.6°F

## 2022-07-24 DIAGNOSIS — Z515 Encounter for palliative care: Secondary | ICD-10-CM

## 2022-07-24 NOTE — Progress Notes (Signed)
PATIENT NAME: Jenny [name] DOB: [birth year] MRN: [id number]  PRIMARY CARE PROVIDER: Derinda Late, MD  RESPONSIBLE PARTY:  Acct ID - Guarantor Home Phone Work Phone Relationship Acct Type  [id number] DENAI, [id number] [phone]  Self P/F     [address], [place], [place] [address]  Home visit completed with patient and caregiver Suanne Marker.  Patient found in bed asleep on my arrival.  Appetite:  Remains good.  Occasionally needs to be fed due to overstuffing her mouth.  Caregiver does not feel there has been any weight loss. No changes in clothing size.   Hypertension:  Better controled with increase in Norvasc.  Mobility:  Using hoyer lift and wheelchair.  On occasion, patient is able to ambulate short distances with PT.  She did have a fall this week but no injuries reported.   Home Health Services:  Currently being seen by PT/OT/ST.  Patient has been able to participate in strengthening exercises with PT.  Needs extensive assistance with ambulation.  ST concerned over aspiration and considered swallow study but family has declined at this time.  Continues with a mechanical soft diet with thin liquids.  Decision to thicken liquids has not been made per Rhonda-private aide.   Hospice:  Discussed option for hospice should patient not progress with therapy or should her condition worsen.  Rhonda aware she can contact Palliative Care if a visit is needed before our next scheduled visit.  She continues to remain in contact with daughter Shauna Hugh.  I advised I have left a couple of message with daughter but no call back.  Encouraged Suanne Marker to provide my contact number should daughter need to speak with me.   Shortness of breath:  has improved but remains dependent on O2 at @ 2 L via [place].  Continues with breathing treatments every 6 hours.     CODE STATUS:  ADVANCED DIRECTIVES: Yes MOST FORM: Yes PPS: 40%   PHYSICAL EXAM:   VITALS: Today's Vitals   07/24/22 0908  BP: 116/84  Pulse: 73   Temp: 97.6 F (36.4 C)  SpO2: 92%  PainSc: 0-No pain    LUNGS: clear to auscultation (anterior) CARDIAC: Cor irreg, irreg RRR}  EXTREMITIES: - for edema SKIN: Skin color, texture, turgor normal. No rashes or lesions or mobility and turgor normal  NEURO: positive for gait problems and memory problems       Lorenza Burton, RN

## 2022-07-30 ENCOUNTER — Other Ambulatory Visit: Payer: Self-pay | Admitting: Family Medicine

## 2022-07-30 DIAGNOSIS — K219 Gastro-esophageal reflux disease without esophagitis: Secondary | ICD-10-CM

## 2022-07-30 DIAGNOSIS — R1310 Dysphagia, unspecified: Secondary | ICD-10-CM

## 2022-08-19 ENCOUNTER — Ambulatory Visit
Admission: RE | Admit: 2022-08-19 | Discharge: 2022-08-19 | Disposition: A | Discharge status: Home / Self Care | Payer: Medicare Other | Source: Ambulatory Visit | Attending: Family Medicine | Admitting: Family Medicine

## 2022-08-19 DIAGNOSIS — K219 Gastro-esophageal reflux disease without esophagitis: Secondary | ICD-10-CM | POA: Insufficient documentation

## 2022-08-19 DIAGNOSIS — R1310 Dysphagia, unspecified: Secondary | ICD-10-CM | POA: Insufficient documentation

## 2022-08-19 DIAGNOSIS — K449 Diaphragmatic hernia without obstruction or gangrene: Secondary | ICD-10-CM | POA: Diagnosis present

## 2022-08-19 NOTE — Progress Notes (Signed)
Modified Barium Swallow Study  Patient Details  Name: Jenny [name] MRN: [id number] Date of Birth: 11/07/1927  Today's Date: 08/19/2022  Modified Barium Swallow completed.  Full report located under Chart Review in the Imaging Section.  History of Present Illness Pt is a 89 y.o. year old female  with multiple medical problems including Dementia per chart, Embolitic stroke, atrial fibrillation, cerebral artery disease, hypothyroidism, diverticulitis of colon, GERD, hiatal hernia, hyperlipidemia, and HOH. Per Daughter's report, pt had "double pneumonia" in 06/2022. Pt was recently d/c from Hospice services due to stability in 2023 but continues to receive [facility] services per chart notes. Pt has full time caregivers. Pt's Daughter present and endorsed pt eats "quickly putting too much food in her mouth at one time"; impulsivity w/ decreased awareness of self. She has bee instructed to take Smaller bites/sips(has worked w/ Speech services?) and to reduce distractions while eating. Duaghter and Caregiver feel that pt's impulsive eating might be leading to possible aspiration. Per previous chart note by NP in 2023, family/pt wishes are for "focus to be on comfort care. No wishes for interventions except comfort care at home. Will re-visit hospice when decline, meets criteria.".  Pt's last documented CXR was in 2021: "Slight bibasilar atelectasis. No edema or airspace opacity.".   Clinical Impression Pt presents with what appears to be moderate, chronic oropharyngeal dysphagia with contributing factors of Baseline Cognitive decline/Dementia(per chart), weakness, severely HOH, GERD, and age-related changes including sensory deficits  Oral phase is characterized by mild+ disorganized lingual transport and control of bolus w/ slight loss of thin liquid boluses into the pharynx. Swallow initiation is consistently delayed to the level of the pyriform sinuses with liquids - suspect some impact from  age-related changes as well as Baseline GERD. Pharyngeal phase is noted for mildly decreased base of tongue retraction and hyolaryngeal excursion resulting in min residue collection in the valleculae; stripping wave appeared wfl. Epiglottic movement was partial w/ liquids w/ incomplete closure of laryngeal vestibule resulting in laryngeal penetration occurting w/ Both thin and nectar liquids liquids (trace-min coating along the underside of the epiglottis remained - contact to the vocal cords occurring x1-2 w/ thin liquids during consecutive sips); no penetration with food consistencies. Laryngeal penetration was Silent. Cued throat clear b/t trials appeared somewhat effective in reducing/clearing the noted laryngeal penetration.    NO ASPIRATION NOTED during this study today.    Strategies: a f/u, Dry swallow was fairly effective in reducing BOT and Valleculae residue. Small bites and Single sips SLOWLY appeared effective in improving bolus control and safer swallowing. Cued throat clearing fairly effective in reducing/clearing laryngeal penetration. Due to Cognitive deficits, pt required MOD verbal cues for follow through during po tasks.     Daughter expressed desire for pt to eat and drink foods she likes; she just wanted pt to be "as safe as possible doing it". Education on the strategies utilized to help reduce risks but educated Daughter on the fact that these strategies cannot fully prevent aspiration risk. Informed Daughter that pt will need consistent Supervision and cueing to implement aspiration precautions and strategiews effectively. Daugher verbalized understanding of risks, strategies, and precautions.    Recommend Supervision for follow through w/ aspiration precautions and safe swallowing strategies to include strong throat clearing during(intermittnetly) and after po's and lessening Impulsive eating/drinking. GERD precautions in settting of Baseline GERD.    Factors that may increase risk  of adverse event in presence of aspiration ([place] 2021): Reduced cognitive function;Limited  mobility;Frail or deconditioned    Swallow Evaluation Recommendations Recommendations: PO diet PO Diet Recommendation: Regular;Thin liquids (Level 0) (food Cut small and moistened well) Liquid Administration via: Cup Medication Administration: Whole meds with puree Supervision: Full supervision/cueing for swallowing strategies;Patient able to self-feed (vs need to Crush in Puree) Swallowing strategies  : Slow rate;Small bites/sips;Multiple dry swallows after each bite/sip;Follow solids with liquids;Clear throat intermittently;Minimize environmental distractions Postural changes: Position pt fully upright for meals;Stay upright 30-60 min after meals (GERD Precautions) Oral care recommendations: Oral care BID (2x/day);Oral care before PO;Staff/trained caregiver to provide oral care Recommended consults: Consider Palliative care;Other(comment) (Palliative Care services ongoing -- ongoing discussion for [facility] re: dysphagia, modified diets) Caregiver Recommendations:  (Supervision to reduce Impulsive eating behaviors and utilize strategies and precautions)       Orinda Kenner, MS, CCC-SLP Speech Language Pathologist Rehab Services; [place] [phone] (ascom) Tykeshia Tourangeau 08/19/2022,6:01 PM

## 2022-08-20 ENCOUNTER — Ambulatory Visit: Payer: Medicare Other

## 2022-08-22 ENCOUNTER — Other Ambulatory Visit: Payer: Medicare Other

## 2022-08-22 VITALS — BP 127/72 | HR 69 | Temp 97.6°F

## 2022-08-22 DIAGNOSIS — Z515 Encounter for palliative care: Secondary | ICD-10-CM

## 2022-08-22 NOTE — Progress Notes (Unsigned)
PATIENT NAME: Jenny [name] DOB: [birth year] MRN: [id number]  PRIMARY CARE PROVIDER: Derinda Late, MD  RESPONSIBLE PARTY:  Acct ID - Guarantor Home Phone Work Phone Relationship Acct Type  [id number] [name], [name]  Self P/F     [address], [place], [place] [address]   Home visit completed with patient and caregiver [name].  Swallow Study:  Reviewed recommendations below with [name]. Patient has been doing fairly well with swallowing but depends on level of alertness.    Swallow Evaluation Recommendations Recommendations: PO diet PO Diet Recommendation: Regular;Thin liquids (Level 0) (food Cut small and moistened well) Liquid Administration via: Cup Medication Administration: Whole meds with puree Supervision: Full supervision/cueing for swallowing strategies;Patient able to self-feed (vs need to Crush in Puree) Swallowing strategies  : Slow rate;Small bites/sips;Multiple dry swallows after each bite/sip;Follow solids with liquids;Clear throat intermittently;Minimize environmental distractions Postural changes: Position pt fully upright for meals;Stay upright 30-60 min after meals (GERD Precautions) Oral care recommendations: Oral care BID (2x/day);Oral care before PO;Staff/trained caregiver to provide oral care Recommended consults: Consider Palliative care;Other(comment) (Palliative Care services ongoing -- ongoing discussion for [name] re: dysphagia, modified diets) Caregiver Recommendations:  (Supervision to reduce Impulsive eating behaviors and utilize strategies and precautions)  Mobility:  No longer ambulating short distances.  Continues with HH PT but mostly doing strengthening exercises.  No falls reported.  Continues using the sit to stand lift.  Medication Management:  Needs refill on Gabapentin.  Will follow up with pharmacy.     CODE STATUS: DNR ADVANCED DIRECTIVES: Yes MOST FORM: Yes PPS: 30%   PHYSICAL EXAM:   VITALS: Today's Vitals   08/22/22  0904  BP: 127/72  Pulse: 69  Temp: 97.6 F (36.4 C)  SpO2: 92%    LUNGS: clear to auscultation  CARDIAC: Cor irreg, irreg RRR}  EXTREMITIES: - for edema SKIN: Skin color, texture, turgor normal. No rashes or lesions or mobility and turgor normal  NEURO: positive for gait problems and memory problems       Lorenza Burton, RN

## 2022-09-18 ENCOUNTER — Other Ambulatory Visit: Payer: Medicare Other

## 2022-09-18 VITALS — BP 120/78 | HR 73 | Temp 97.6°F

## 2022-09-18 DIAGNOSIS — Z515 Encounter for palliative care: Secondary | ICD-10-CM

## 2022-09-18 NOTE — Progress Notes (Signed)
PATIENT NAME: Jenny [name] DOB: 1927-07-29 MRN: [id number]  PRIMARY CARE PROVIDER: Derinda Late, MD  RESPONSIBLE PARTY:  Acct ID - Guarantor Home Phone Work Phone Relationship Acct Type  [id number] SERYN, [name]  Self P/F     [address], [place], [place] [address]   Home visit completed with patient and caregiver Marcie Bal.  Functional Status:  Caregivers are using sit/stand lift for transfers.  Patient is no longer ambulatory.   Sleeping for longer periods of time.  Typically goes to bed at 8 pm and awakens around 10 am.  Caregiver denies napping during the day.  No changes in memory noted by caregiver.   Medication Management:  No refills needed at this time.   Oxygen:  Now only requiring oxygen at HS.  Using breathing treatments as needed for occasional wheezing.   Swallowing:  Caregiver advises patient is fed meals at times.  She will occasionally "shovel" food into her mouth and forget to swallow.  Discussed possibly placing smaller portion sizes on a separate plate to allow patient to continue to feed herself but more controled amounts.  Patient continues to work with [facility] every Monday.  Therapy:  Continues with PT/ST weekly.   Urine:  More concentrated with slight odor.  Caregiver will push fluids.  If patient presents with symptoms of UTI, caregiver instructed to contact [facility].   Follow up visit scheduled for next month.    CODE STATUS: DNR ADVANCED DIRECTIVES: Yes MOST FORM: Yes PPS: 30%   PHYSICAL EXAM:   VITALS: Today's Vitals   09/18/22 1005  BP: 120/78  Pulse: 73  Temp: 97.6 F (36.4 C)  SpO2: 94%    LUNGS: clear to auscultation  CARDIAC: Cor RRR}  EXTREMITIES: - for edema SKIN: Skin color, texture, turgor normal. No rashes or lesions or mobility and turgor normal  NEURO: positive for gait problems and memory problems       Lorenza Burton, RN

## 2022-10-22 ENCOUNTER — Other Ambulatory Visit: Payer: Medicare Other

## 2022-10-22 VITALS — BP 136/82 | HR 78 | Temp 97.4°F

## 2022-10-22 DIAGNOSIS — Z515 Encounter for palliative care: Secondary | ICD-10-CM

## 2022-10-22 NOTE — Progress Notes (Signed)
PATIENT NAME: Jenny [name] DOB: 1927-11-24 MRN: [id number]  PRIMARY CARE PROVIDER: Kandyce Rud, MD  RESPONSIBLE PARTY:  Acct ID - Guarantor Home Phone Work Phone Relationship Acct Type  [id number] [name], [name]* [phone]  Self P/F     [address], [place], [place] [address]   Home visit completed with patient and caregiver Liborio Nixon.  Constipation:  Having issues with constipation.  Going 6-10 days without a bowel movement.   Taking miralax in the am and senekot s 2 tabs at bedtime.  Phone call made to [facility] to update on above to see if adjustments could be made to bowel regimen.  Spoke with Cindra Eves, SW who will provide a message to provider.   Dementia:  Very little meaningful conversation talking about random people.  No longer ambulatory and caregivers are using a sit to stand lift.  Being feed most meals due to aspiration risk.  Napping some in the day. Awakening earlier 8-9 am and going to bed around 8 pm.  Occasional agitation reported but patient is easily redirected.   Home Health:  ST has ended.   PT continues weekly for restorative therapy.  Only doing upper body strengthening.    Resource:  Dementia Resource Guide provided to primary caregiver Liborio Nixon.  Will further discuss on next visit in May.   Speech:  Liborio Nixon reports issues with slurred speech in the last month.  Caregivers felt patient was having a TIA.  Symptoms resolved within 24 hours.   UTI:  Reviewed signs and symptoms of UTI with caregiver.  Patient has a history of reporting dysuria, caregivers observe behavioral changes such as agitation and dark colored urine when UTI is present.   CODE STATUS: DNR ADVANCED DIRECTIVES: Yes MOST FORM: Yes PPS: 30%   PHYSICAL EXAM:   VITALS: Today's Vitals   10/22/22 1049  BP: 136/82  Pulse: 78  Temp: (!) 97.4 F (36.3 C)  SpO2: 92%    LUNGS: clear to auscultation  CARDIAC: Cor RRR}  EXTREMITIES: - for edema SKIN: Skin color, texture, turgor normal. No  rashes or lesions or mobility and turgor normal  NEURO: positive for gait problems and memory problems       Truitt Merle, RN

## 2022-11-18 ENCOUNTER — Telehealth: Payer: Self-pay

## 2022-11-18 NOTE — Telephone Encounter (Signed)
TC to patient to cancel/reschedule PC RN for today. SW LVM on patient home phone.  TC to patients daughter, Sedalia Muta, to make aware of schedule change. Daughter shared she will outreach patiens caregiver to make aware.

## 2022-11-19 ENCOUNTER — Other Ambulatory Visit: Payer: Medicare Other

## 2023-10-06 DEATH — deceased
